# Patient Record
Sex: Male | Born: 1955
Health system: Southern US, Community
[De-identification: ages and names within clinical notes are randomized; demographics above are authoritative.]

## PROBLEM LIST (undated history)

## (undated) DIAGNOSIS — R202 Paresthesia of skin: Secondary | ICD-10-CM

## (undated) DIAGNOSIS — F419 Anxiety disorder, unspecified: Secondary | ICD-10-CM

## (undated) DIAGNOSIS — J449 Chronic obstructive pulmonary disease, unspecified: Secondary | ICD-10-CM

## (undated) DIAGNOSIS — E039 Hypothyroidism, unspecified: Secondary | ICD-10-CM

## (undated) DIAGNOSIS — I10 Essential (primary) hypertension: Secondary | ICD-10-CM

## (undated) DIAGNOSIS — C029 Malignant neoplasm of tongue, unspecified: Secondary | ICD-10-CM

## (undated) DIAGNOSIS — F4310 Post-traumatic stress disorder, unspecified: Secondary | ICD-10-CM

## (undated) DIAGNOSIS — I639 Cerebral infarction, unspecified: Secondary | ICD-10-CM

## (undated) DIAGNOSIS — F429 Obsessive-compulsive disorder, unspecified: Secondary | ICD-10-CM

## (undated) HISTORY — PX: TONSILLECTOMY: SUR1361

## (undated) HISTORY — DX: Malignant neoplasm of tongue, unspecified: C02.9

---

## 2001-05-30 DIAGNOSIS — C801 Malignant (primary) neoplasm, unspecified: Secondary | ICD-10-CM | POA: Insufficient documentation

## 2001-05-30 HISTORY — PX: NECK SURGERY: SHX720

## 2001-07-03 ENCOUNTER — Encounter: Admission: RE | Admit: 2001-07-03 | Discharge: 2001-07-03 | Payer: Self-pay | Admitting: Family Medicine

## 2001-07-03 ENCOUNTER — Encounter: Payer: Self-pay | Admitting: Family Medicine

## 2001-08-03 ENCOUNTER — Other Ambulatory Visit: Admission: RE | Admit: 2001-08-03 | Discharge: 2001-08-03 | Payer: Self-pay | Admitting: Surgery

## 2001-09-26 ENCOUNTER — Ambulatory Visit: Admission: RE | Admit: 2001-09-26 | Discharge: 2001-12-25 | Payer: Self-pay | Admitting: Radiation Oncology

## 2001-09-28 ENCOUNTER — Encounter (HOSPITAL_COMMUNITY): Admission: RE | Admit: 2001-09-28 | Discharge: 2001-12-27 | Payer: Self-pay | Admitting: Dentistry

## 2001-10-01 ENCOUNTER — Encounter: Payer: Self-pay | Admitting: Otolaryngology

## 2001-10-03 ENCOUNTER — Inpatient Hospital Stay (HOSPITAL_COMMUNITY): Admission: RE | Admit: 2001-10-03 | Discharge: 2001-10-05 | Payer: Self-pay | Admitting: Otolaryngology

## 2001-10-03 ENCOUNTER — Encounter (INDEPENDENT_AMBULATORY_CARE_PROVIDER_SITE_OTHER): Payer: Self-pay | Admitting: Specialist

## 2001-12-07 ENCOUNTER — Encounter: Payer: Self-pay | Admitting: Radiation Oncology

## 2001-12-07 ENCOUNTER — Ambulatory Visit (HOSPITAL_COMMUNITY): Admission: RE | Admit: 2001-12-07 | Discharge: 2001-12-07 | Payer: Self-pay | Admitting: Radiation Oncology

## 2002-01-31 ENCOUNTER — Ambulatory Visit (HOSPITAL_COMMUNITY): Admission: RE | Admit: 2002-01-31 | Discharge: 2002-01-31 | Payer: Self-pay | Admitting: Radiation Oncology

## 2002-06-20 ENCOUNTER — Ambulatory Visit: Admission: RE | Admit: 2002-06-20 | Discharge: 2002-06-20 | Payer: Self-pay | Admitting: Radiation Oncology

## 2002-06-24 ENCOUNTER — Ambulatory Visit (HOSPITAL_COMMUNITY): Admission: RE | Admit: 2002-06-24 | Discharge: 2002-06-24 | Payer: Self-pay | Admitting: Radiation Oncology

## 2003-03-20 ENCOUNTER — Ambulatory Visit: Admission: RE | Admit: 2003-03-20 | Discharge: 2003-03-20 | Payer: Self-pay | Admitting: Radiation Oncology

## 2003-06-19 ENCOUNTER — Ambulatory Visit: Admission: RE | Admit: 2003-06-19 | Discharge: 2003-06-19 | Payer: Self-pay | Admitting: Radiation Oncology

## 2003-10-23 ENCOUNTER — Ambulatory Visit: Admission: RE | Admit: 2003-10-23 | Discharge: 2003-10-23 | Payer: Self-pay | Admitting: Radiation Oncology

## 2004-04-15 ENCOUNTER — Ambulatory Visit: Admission: RE | Admit: 2004-04-15 | Discharge: 2004-04-15 | Payer: Self-pay | Admitting: Radiation Oncology

## 2004-08-26 ENCOUNTER — Ambulatory Visit: Admission: RE | Admit: 2004-08-26 | Discharge: 2004-08-26 | Payer: Self-pay | Admitting: Radiation Oncology

## 2004-12-23 ENCOUNTER — Ambulatory Visit: Admission: RE | Admit: 2004-12-23 | Discharge: 2004-12-23 | Payer: Self-pay | Admitting: Radiation Oncology

## 2005-09-16 ENCOUNTER — Encounter: Payer: Self-pay | Admitting: Vascular Surgery

## 2005-09-16 ENCOUNTER — Inpatient Hospital Stay (HOSPITAL_COMMUNITY): Admission: EM | Admit: 2005-09-16 | Discharge: 2005-09-17 | Payer: Self-pay | Admitting: Emergency Medicine

## 2005-09-23 ENCOUNTER — Ambulatory Visit: Payer: Self-pay | Admitting: Internal Medicine

## 2005-10-07 ENCOUNTER — Ambulatory Visit: Payer: Self-pay | Admitting: Internal Medicine

## 2005-10-25 ENCOUNTER — Ambulatory Visit: Payer: Self-pay | Admitting: *Deleted

## 2006-01-16 ENCOUNTER — Ambulatory Visit: Payer: Self-pay | Admitting: Internal Medicine

## 2006-01-16 ENCOUNTER — Ambulatory Visit (HOSPITAL_COMMUNITY): Admission: RE | Admit: 2006-01-16 | Discharge: 2006-01-16 | Payer: Self-pay | Admitting: Internal Medicine

## 2006-01-20 ENCOUNTER — Ambulatory Visit: Payer: Self-pay | Admitting: Internal Medicine

## 2006-03-10 ENCOUNTER — Ambulatory Visit: Payer: Self-pay | Admitting: Internal Medicine

## 2006-03-14 ENCOUNTER — Ambulatory Visit (HOSPITAL_COMMUNITY): Admission: RE | Admit: 2006-03-14 | Discharge: 2006-03-14 | Payer: Self-pay | Admitting: Internal Medicine

## 2007-02-16 DIAGNOSIS — J439 Emphysema, unspecified: Secondary | ICD-10-CM | POA: Insufficient documentation

## 2007-02-16 DIAGNOSIS — F329 Major depressive disorder, single episode, unspecified: Secondary | ICD-10-CM | POA: Insufficient documentation

## 2007-02-16 DIAGNOSIS — F1996 Other psychoactive substance use, unspecified with psychoactive substance-induced persisting amnestic disorder: Secondary | ICD-10-CM | POA: Insufficient documentation

## 2007-02-16 DIAGNOSIS — E039 Hypothyroidism, unspecified: Secondary | ICD-10-CM | POA: Insufficient documentation

## 2007-03-09 ENCOUNTER — Encounter (INDEPENDENT_AMBULATORY_CARE_PROVIDER_SITE_OTHER): Payer: Self-pay | Admitting: Internal Medicine

## 2007-03-09 ENCOUNTER — Telehealth (INDEPENDENT_AMBULATORY_CARE_PROVIDER_SITE_OTHER): Payer: Self-pay | Admitting: *Deleted

## 2007-03-13 ENCOUNTER — Ambulatory Visit: Payer: Self-pay | Admitting: Nurse Practitioner

## 2007-03-13 DIAGNOSIS — K029 Dental caries, unspecified: Secondary | ICD-10-CM | POA: Insufficient documentation

## 2007-03-13 LAB — CONVERTED CEMR LAB
Albumin: 4.8 g/dL (ref 3.5–5.2)
BUN: 19 mg/dL (ref 6–23)
CO2: 26 meq/L (ref 19–32)
Calcium: 9.6 mg/dL (ref 8.4–10.5)
Cholesterol: 200 mg/dL (ref 0–200)
Eosinophils Absolute: 0.2 10*3/uL (ref 0.0–0.7)
Free T4: 1.34 ng/dL (ref 0.89–1.80)
Glucose, Bld: 78 mg/dL (ref 70–99)
HCT: 48.1 % (ref 39.0–52.0)
HDL: 46 mg/dL (ref >39)
LDL Cholesterol: 140 mg/dL — ABNORMAL HIGH (ref 0–99)
Lymphocytes Relative: 19 % (ref 12–46)
Lymphs Abs: 1.1 10*3/uL (ref 0.7–3.3)
MCV: 96.6 fL (ref 78.0–100.0)
Monocytes Relative: 8 % (ref 3–11)
Neutrophils Relative %: 69 % (ref 43–77)
Platelets: 224 10*3/uL (ref 150–400)
Potassium: 4.3 meq/L (ref 3.5–5.3)
RBC: 4.98 M/uL (ref 4.22–5.81)
Sodium: 139 meq/L (ref 135–145)
TSH: 4.713 microintl units/mL (ref 0.350–5.50)
Total Protein: 7.6 g/dL (ref 6.0–8.3)
Triglycerides: 68 mg/dL (ref <150)
VLDL: 14 mg/dL (ref 0–40)
WBC: 5.6 10*3/uL (ref 4.0–10.5)

## 2007-03-14 ENCOUNTER — Encounter (INDEPENDENT_AMBULATORY_CARE_PROVIDER_SITE_OTHER): Payer: Self-pay | Admitting: Nurse Practitioner

## 2007-04-03 ENCOUNTER — Telehealth (INDEPENDENT_AMBULATORY_CARE_PROVIDER_SITE_OTHER): Payer: Self-pay | Admitting: Internal Medicine

## 2007-06-07 ENCOUNTER — Ambulatory Visit: Payer: Self-pay | Admitting: Family Medicine

## 2007-06-07 ENCOUNTER — Encounter (INDEPENDENT_AMBULATORY_CARE_PROVIDER_SITE_OTHER): Payer: Self-pay | Admitting: Internal Medicine

## 2007-06-08 ENCOUNTER — Encounter (INDEPENDENT_AMBULATORY_CARE_PROVIDER_SITE_OTHER): Payer: Self-pay | Admitting: Internal Medicine

## 2007-11-02 ENCOUNTER — Telehealth (INDEPENDENT_AMBULATORY_CARE_PROVIDER_SITE_OTHER): Payer: Self-pay | Admitting: Internal Medicine

## 2007-11-20 ENCOUNTER — Encounter (INDEPENDENT_AMBULATORY_CARE_PROVIDER_SITE_OTHER): Payer: Self-pay | Admitting: Internal Medicine

## 2008-01-03 ENCOUNTER — Ambulatory Visit: Payer: Self-pay | Admitting: Internal Medicine

## 2008-01-03 DIAGNOSIS — K056 Periodontal disease, unspecified: Secondary | ICD-10-CM | POA: Insufficient documentation

## 2008-01-03 DIAGNOSIS — K069 Disorder of gingiva and edentulous alveolar ridge, unspecified: Secondary | ICD-10-CM

## 2008-06-04 ENCOUNTER — Encounter (INDEPENDENT_AMBULATORY_CARE_PROVIDER_SITE_OTHER): Payer: Self-pay | Admitting: Internal Medicine

## 2008-06-04 LAB — CONVERTED CEMR LAB
BUN: 16 mg/dL (ref 6–23)
Barbiturate Quant, Ur: NEGATIVE
Benzodiazepines.: NEGATIVE
Creatinine, Ser: 1.11 mg/dL (ref 0.40–1.50)
Marijuana Metabolite: POSITIVE — AB
Methadone: NEGATIVE
Propoxyphene: NEGATIVE

## 2008-06-05 ENCOUNTER — Encounter (INDEPENDENT_AMBULATORY_CARE_PROVIDER_SITE_OTHER): Payer: Self-pay | Admitting: Nurse Practitioner

## 2008-08-11 ENCOUNTER — Encounter (INDEPENDENT_AMBULATORY_CARE_PROVIDER_SITE_OTHER): Payer: Self-pay | Admitting: Internal Medicine

## 2008-09-18 ENCOUNTER — Ambulatory Visit: Payer: Self-pay | Admitting: Internal Medicine

## 2008-09-18 DIAGNOSIS — R0989 Other specified symptoms and signs involving the circulatory and respiratory systems: Secondary | ICD-10-CM | POA: Insufficient documentation

## 2008-09-28 LAB — CONVERTED CEMR LAB
ALT: 18 units/L (ref 0–53)
AST: 29 units/L (ref 0–37)
Alkaline Phosphatase: 64 units/L (ref 39–117)
Basophils Relative: 0 % (ref 0–1)
CO2: 26 meq/L (ref 19–32)
Cholesterol: 248 mg/dL — ABNORMAL HIGH (ref 0–200)
Creatinine, Ser: 1.05 mg/dL (ref 0.40–1.50)
LDL Cholesterol: 173 mg/dL — ABNORMAL HIGH (ref 0–99)
MCHC: 33.6 g/dL (ref 30.0–36.0)
Monocytes Relative: 6 % (ref 3–12)
Neutro Abs: 5 10*3/uL (ref 1.7–7.7)
Neutrophils Relative %: 73 % (ref 43–77)
PSA: 0.72 ng/mL (ref 0.10–4.00)
Platelets: 175 10*3/uL (ref 150–400)
RBC: 4.59 M/uL (ref 4.22–5.81)
Sodium: 139 meq/L (ref 135–145)
TSH: 3.289 microintl units/mL (ref 0.350–4.500)
Total Bilirubin: 0.7 mg/dL (ref 0.3–1.2)
Total CHOL/HDL Ratio: 5
Total Protein: 7.3 g/dL (ref 6.0–8.3)
VLDL: 25 mg/dL (ref 0–40)
WBC: 6.8 10*3/uL (ref 4.0–10.5)

## 2008-10-02 ENCOUNTER — Encounter (INDEPENDENT_AMBULATORY_CARE_PROVIDER_SITE_OTHER): Payer: Self-pay | Admitting: Internal Medicine

## 2008-10-02 ENCOUNTER — Ambulatory Visit: Payer: Self-pay | Admitting: Vascular Surgery

## 2008-10-02 ENCOUNTER — Ambulatory Visit (HOSPITAL_COMMUNITY): Admission: RE | Admit: 2008-10-02 | Discharge: 2008-10-02 | Payer: Self-pay | Admitting: Internal Medicine

## 2008-10-08 ENCOUNTER — Encounter (INDEPENDENT_AMBULATORY_CARE_PROVIDER_SITE_OTHER): Payer: Self-pay | Admitting: Internal Medicine

## 2008-10-13 ENCOUNTER — Encounter (INDEPENDENT_AMBULATORY_CARE_PROVIDER_SITE_OTHER): Payer: Self-pay | Admitting: Internal Medicine

## 2008-10-23 ENCOUNTER — Ambulatory Visit: Payer: Self-pay | Admitting: Internal Medicine

## 2008-10-23 DIAGNOSIS — R109 Unspecified abdominal pain: Secondary | ICD-10-CM | POA: Insufficient documentation

## 2008-10-23 DIAGNOSIS — M545 Low back pain, unspecified: Secondary | ICD-10-CM | POA: Insufficient documentation

## 2008-10-23 DIAGNOSIS — E7800 Pure hypercholesterolemia, unspecified: Secondary | ICD-10-CM | POA: Insufficient documentation

## 2009-01-21 ENCOUNTER — Ambulatory Visit: Payer: Self-pay | Admitting: Internal Medicine

## 2009-01-21 LAB — CONVERTED CEMR LAB: Cholesterol: 184 mg/dL (ref 0–200)

## 2009-01-27 ENCOUNTER — Ambulatory Visit: Payer: Self-pay | Admitting: Internal Medicine

## 2009-01-27 DIAGNOSIS — S1093XA Contusion of unspecified part of neck, initial encounter: Secondary | ICD-10-CM

## 2009-01-27 DIAGNOSIS — S0083XA Contusion of other part of head, initial encounter: Secondary | ICD-10-CM

## 2009-01-27 DIAGNOSIS — S0003XA Contusion of scalp, initial encounter: Secondary | ICD-10-CM | POA: Insufficient documentation

## 2009-12-21 ENCOUNTER — Telehealth (INDEPENDENT_AMBULATORY_CARE_PROVIDER_SITE_OTHER): Payer: Self-pay | Admitting: Internal Medicine

## 2010-01-20 ENCOUNTER — Telehealth (INDEPENDENT_AMBULATORY_CARE_PROVIDER_SITE_OTHER): Payer: Self-pay | Admitting: Internal Medicine

## 2010-02-22 ENCOUNTER — Telehealth (INDEPENDENT_AMBULATORY_CARE_PROVIDER_SITE_OTHER): Payer: Self-pay | Admitting: Internal Medicine

## 2010-03-12 ENCOUNTER — Ambulatory Visit: Payer: Self-pay | Admitting: Internal Medicine

## 2010-03-12 DIAGNOSIS — R51 Headache: Secondary | ICD-10-CM | POA: Insufficient documentation

## 2010-03-12 DIAGNOSIS — R519 Headache, unspecified: Secondary | ICD-10-CM | POA: Insufficient documentation

## 2010-03-12 DIAGNOSIS — I10 Essential (primary) hypertension: Secondary | ICD-10-CM | POA: Insufficient documentation

## 2010-03-12 DIAGNOSIS — J309 Allergic rhinitis, unspecified: Secondary | ICD-10-CM | POA: Insufficient documentation

## 2010-03-26 ENCOUNTER — Ambulatory Visit: Payer: Self-pay | Admitting: Internal Medicine

## 2010-04-01 LAB — CONVERTED CEMR LAB
BUN: 11 mg/dL (ref 6–23)
CO2: 26 meq/L (ref 19–32)
Chloride: 102 meq/L (ref 96–112)
Cholesterol: 287 mg/dL — ABNORMAL HIGH (ref 0–200)
Glucose, Bld: 75 mg/dL (ref 70–99)
LDL Cholesterol: 207 mg/dL — ABNORMAL HIGH (ref 0–99)
Potassium: 4.5 meq/L (ref 3.5–5.3)
Sodium: 138 meq/L (ref 135–145)
Total CHOL/HDL Ratio: 4.9
VLDL: 21 mg/dL (ref 0–40)

## 2010-04-09 ENCOUNTER — Ambulatory Visit: Payer: Self-pay | Admitting: Internal Medicine

## 2010-05-10 ENCOUNTER — Ambulatory Visit: Payer: Self-pay | Admitting: Internal Medicine

## 2010-06-29 NOTE — Progress Notes (Signed)
Summary: Query:  refill levothyroxine?  Phone Note Outgoing Call   Summary of Call: Do you want his levothyroxine refilled?  He hasn't been seen since 12/2008.  I notified him that he needs a CPE -- has appt. to renew orange card next month. Initial call taken by: Dutch Quint RN,  January 20, 2010 9:46 AM  Follow-up for Phone Call        yes, refill for 30 tabs with 1 refill until pt can be seen in office since he can get eligibility next month Follow-up by: Lehman Prom FNP,  January 20, 2010 5:47 PM  Additional Follow-up for Phone Call Additional follow up Details #1::        Noted.  Dutch Quint RN  January 20, 2010 5:52 PM

## 2010-06-29 NOTE — Assessment & Plan Note (Signed)
Summary: F/U APP FOR THYROID MEDS//[facility]   Vital Signs:  Patient profile:   55 year old male Weight:      153.5 pounds Temp:     97.0 degrees F oral Pulse rate:   60 / minute Pulse rhythm:   regular Resp:     16 per minute BP sitting:   138 / 90  (left arm) Cuff size:   regular  Vitals Entered By: Michelle Nasuti (March 12, 2010 9:10 AM) CC: THYROID FOLLOW UP.MED REFILL NEEDED Is Patient Diabetic? No Pain Assessment Patient in pain? yes     Location: R SHOULDER Intensity: 1  Does patient need assistance? Functional Status Self care Ambulation Normal   CC:  THYROID FOLLOW UP.MED REFILL NEEDED.  History of Present Illness: Pt. here after a bit of a hiatus--ran out of eligibility for a time.  1.  Hypothyroidism:  States he has been taking hormone regularly.  Needs a level.  2.  Elevated BP:  Having a bit of a headache this morning--usually has a couple of cups of coffee in the morning, but did not have any today.  Has had headaches before when skips his coffee.    3.  Headaches:  2-3 times weekly.  Awakens with this--feels like it's behind his eyes.  Later in morning, generally has cleared--usually drinking coffee at the time.  Has been sniffling and sneezing recently as well.  Also having drainage down throat with a bit of a sore throat.  No eye symptoms.  Did have ear pain  6 weeks ago--used some otc ear drops and herbal pills and cleared after a couple of days.    4.  Dysphagia:  Has had a couple of episodes--throat sensitive since tongue squamous cell carcinoma treatment and radiation.  No heartburn.  No brash taste in mouth on awakening.  Does get thick mucous in throat since cancer and radiation treatment.  5.  Allergic Reaction to seafood past summer--all sorts of seafood--only new thing was crawdads.  Swelled with tongue and all over on skin--had itching and hives as well.    Took some Benadryl.  Did not seek medical attention.  Current Medications (verified): 1)   Synthroid 50 Mcg  Tabs (Levothyroxine Sodium) .Marland Kitchen.. 1 Tab By Mouth Daily 2)  Lexapro 10 Mg  Tabs (Escitalopram Oxalate) .Marland Kitchen.. 1 Tablet By Mouth Daily For Mood 3)  Trazodone Hcl 50 Mg  Tabs (Trazodone Hcl) .Marland Kitchen.. 1 Tablet By Mouth At Night For Sleep 4)  Wellbutrin Xl 150 Mg  Xr24h-Tab (Bupropion Hcl) .... Take One Tablet Every Morning 5)  Hydrocodone-Acetaminophen 5-325 Mg Tabs (Hydrocodone-Acetaminophen) .Marland Kitchen.. 1 - 2 Tabs By Mouth Every 4 Hours As Needed Pain  Allergies (verified): 1)  ! * Seafood  Physical Exam  General:  NAD Head:  Normocephalic and atraumatic without obvious abnormalities.  Eyes:  No corneal or conjunctival inflammation noted. EOMI. Perrla. Funduscopic exam benign, without hemorrhages, exudates or papilledema. Vision grossly normal. Ears:  External ear exam shows no significant lesions or deformities.  Otoscopic examination reveals clear canals, tympanic membranes are intact bilaterally without bulging, retraction, inflammation or discharge. Hearing is grossly normal bilaterally. Nose:  Nasal mucosa a bit swollen with clear nasal discharge Mouth:  pharynx pink and moist.  No obvious cobbling of posterior pharynx Neck:  No deformities, masses, or tenderness noted. Lungs:  Decreased BS throughout, no crackles or wheeze.   Heart:  Normal rate and regular rhythm. S1 and S2 normal without gallop, murmur, click, rub or other extra  sounds. Abdomen:  Bowel sounds positive,abdomen soft and non-tender without masses, organomegaly or hernias noted. Neurologic:  alert & oriented X3 and cranial nerves II-XII intact.     Impression & Recommendations:  Problem # 1:  HEADACHE (ICD-784.0) Sounds related to caffeine withdrawal or allergies,  but need to follow up on bp.  Problem # 2:  ALLERGIC RHINITIS (ICD-477.9) Start meds--discussed Xyzal may make him too dry--to stop if notes this. His updated medication list for this problem includes:    Xyzal 5 Mg Tabs (Levocetirizine  dihydrochloride) .Marland Kitchen... 1 tab by mouth daily as needed allergies    Nasacort Aq 55 Mcg/act Aers (Triamcinolone acetonide) .Marland Kitchen... 2 sprays each nostril daily  Problem # 3:  ELEVATED BLOOD PRESSURE WITHOUT DIAGNOSIS OF HYPERTENSION (ICD-796.2) BP still elevated on recheck Orders: UA Dipstick w/o Micro (manual) ([address]) T-Basic Metabolic Panel ([id number])  Problem # 4:  COPD (ICD-496) Stable --needs flu and Pneumovax--out of the latter today--give flu vaccine.  Problem # 5:  HYPOTHYROIDISM (ICD-244.9)  Check labs today His updated medication list for this problem includes:    Synthroid 50 Mcg Tabs (Levothyroxine sodium) .Marland Kitchen... 1 tab by mouth daily  Orders: T-TSH ([id number])  Complete Medication List: 1)  Synthroid 50 Mcg Tabs (Levothyroxine sodium) .Marland Kitchen.. 1 tab by mouth daily 2)  Lexapro 10 Mg Tabs (Escitalopram oxalate) .Marland Kitchen.. 1 tablet by mouth daily for mood 3)  Trazodone Hcl 50 Mg Tabs (Trazodone hcl) .Marland Kitchen.. 1 tablet by mouth at night for sleep 4)  Wellbutrin Xl 150 Mg Xr24h-tab (Bupropion hcl) .... Take one tablet every morning 5)  Epipen 0.3 Mg/0.4ml Devi (Epinephrine) .... Use as directed as needed severe allergic reaction 6)  Xyzal 5 Mg Tabs (Levocetirizine dihydrochloride) .Marland Kitchen.. 1 tab by mouth daily as needed allergies 7)  Nasacort Aq 55 Mcg/act Aers (Triamcinolone acetonide) .... 2 sprays each nostril daily  Other Orders: T-Lipid Profile ([id number]) Influenza Vaccine NON MCR (96295)  Patient Instructions: 1)  Nurse visit for bp check in 2 weeks.  If still high, will need to start meds 2)  Pneumovax when here for bp check 3)  CPE with Dr. Delrae Alfred in 4-6 months Prescriptions: NASACORT AQ 55 MCG/ACT AERS (TRIAMCINOLONE ACETONIDE) 2 sprays each nostril daily  #1 month x 11   Entered and Authorized by:   Julieanne Manson MD   Signed by:   Julieanne Manson MD on 03/12/2010   Method used:   Faxed to ...       [facility] (retail)       [address] [place], [place]  [address]       Ph: [phone]       Fax: [phone]   RxID:   [id number] XYZAL 5 MG TABS (LEVOCETIRIZINE DIHYDROCHLORIDE) 1 tab by mouth daily as needed allergies  #30 x 11   Entered and Authorized by:   Julieanne Manson MD   Signed by:   Julieanne Manson MD on 03/12/2010   Method used:   Faxed to ...       [facility] (retail)       [address] [place], [place]  [address]       Ph: [phone]       Fax: [phone]   RxID:   [id number] SYNTHROID 50 MCG  TABS (LEVOTHYROXINE SODIUM) 1 tab by mouth daily  #30 Each x 11   Entered and Authorized by:  Julieanne Manson MD   Signed by:   Julieanne Manson MD on 03/12/2010   Method used:   Electronically to        [facility]  [phone]* (retail)       [address]       [place], [place]  [address]       Ph: [phone] or [phone]       Fax: [phone]   RxID:   [id number] EPIPEN 0.3 MG/0.3ML DEVI (EPINEPHRINE) use as directed as needed severe allergic reaction  #1 x 1   Entered and Authorized by:   Julieanne Manson MD   Signed by:   Julieanne Manson MD on 03/12/2010   Method used:   Electronically to        [facility]  [phone]* (retail)       [address]       [place], [place]  [address]       Ph: [phone] or [phone]       Fax: [phone]   RxID:   [id number]    Influenza Vaccine    Vaccine Type: Fluvax Non-MCR    Site: left deltoid    Mfr: GlaxoSmithKline    Dose: 0.5 ml    Route: IM    Given by: Michelle Nasuti    Exp. Date: 11/27/2010    Lot #: [id number]    VIS given: 12/22/09 version given March 12, 2010.  Flu Vaccine Consent Questions    Do you have a history of severe allergic reactions to this vaccine? no    Any prior history of allergic reactions to egg and/or gelatin? no    Do you have a  sensitivity to the preservative Thimersol? no    Do you have a past history of Guillan-Barre Syndrome? no    Do you currently have an acute febrile illness? no    Have you ever had a severe reaction to latex? no    Vaccine information given and explained to patient? yes

## 2010-06-29 NOTE — Progress Notes (Signed)
Summary: EAR INFECTION  Phone Note Call from Patient Call back at Home Phone [phone]   Reason for Call: Acute Illness Summary of Call: Jenny Lai PT. MR [name] CALLED AND SAYS THAT HE HAS A EAR INFECTON IN HIS LEFT EAR, AND WANTS TO KNOW IF SOMETHING CAN BE CALLED INTO [facility] ON [address]. HIS CARD HAS EXPIRED AND DOES HAVE AN APPT. FOR RENEWAL. Initial call taken by: Leodis Rains,  December 21, 2009 9:20 AM  Follow-up for Phone Call        Pt. states that he has a pool -- feels like ear was not draining, very sore around ear, inner ear didn't seem to be draining, a lot of inner ear pressure, some itching.  No matter how much he tried to get out fluid, didn't help.  Used ear wash thinking he had wax, nothing came out.  Coolness of solution made ear feel better.  Has had some relief, but has had some pain this morning, better than yesterday or day before.  No redness noted on the outside.    Pain is slightly increased when pulling on ear, can hear like it's "not cloudy" has had trouble hearing over the past four days.  Problems started back at the end of June, then off and on.  Has slight sore throat, also comes and goes.  Advised of home management per protocol -- states already did OTC swim ear with some relief.  Wants something called in because his orange card has lapsed -- advised to go to urgent care if problem persists or gets worse. Follow-up by: Dutch Quint RN,  December 22, 2009 10:08 AM

## 2010-06-29 NOTE — Assessment & Plan Note (Signed)
Summary: 2 WEEK FU FOR BP AND LAB///KT  Nurse Visit   Vital Signs:  Patient profile:   54 year old male Pulse rate:   60 / minute Pulse rhythm:   regular Resp:     20 per minute BP sitting:   134 / 92  (right arm) Cuff size:   regular  Vitals Entered By: Dutch Quint RN (April 09, 2010 9:43 AM)  Impression & Recommendations:  Problem # 1:  ESSENTIAL HYPERTENSION, BENIGN (ICD-401.1) BP better but still elevated Has not been taking HCTZ, been monitoring sodium intake.  BMET not done To return in one month for BP check and BMET with triage nurse.  His updated medication list for this problem includes:    Hydrochlorothiazide 25 Mg Tabs (Hydrochlorothiazide) ..... One tablet by mouth daily for blood pressure.  Orders: Est. Patient Level I ([address])  Complete Medication List: 1)  Synthroid 75 Mcg Tabs (Levothyroxine sodium) .Marland Kitchen.. 1 tab by mouth daily 2)  Lexapro 10 Mg Tabs (Escitalopram oxalate) .Marland Kitchen.. 1 tablet by mouth daily for mood 3)  Trazodone Hcl 50 Mg Tabs (Trazodone hcl) .Marland Kitchen.. 1 tablet by mouth at night for sleep 4)  Wellbutrin Xl 150 Mg Xr24h-tab (Bupropion hcl) .... Take one tablet every morning 5)  Epipen 0.3 Mg/0.36ml Devi (Epinephrine) .... Use as directed as needed severe allergic reaction 6)  Xyzal 5 Mg Tabs (Levocetirizine dihydrochloride) .Marland Kitchen.. 1 tab by mouth daily as needed allergies 7)  Nasacort Aq 55 Mcg/act Aers (Triamcinolone acetonide) .... 2 sprays each nostril daily 8)  Hydrochlorothiazide 25 Mg Tabs (Hydrochlorothiazide) .... One tablet by mouth daily for blood pressure.   Patient Instructions: 1)  Reviewed with Dr. Delrae Alfred. 2)  Your blood pressure is better, but still elevated - 134/92. 3)  Start taking the hydrochlorothiazide as ordered. 4)  Return in one month for a blood pressure check and your labwork with the triage nurse. 5)  Call if anything changes or if you have any questions.   CC:  BP recheck and BMET.  History of Present Illness: Repeat  BP and BMET -- did not start taking his HCTZ.  States he was not feeling well and wanted to see if that effected his BP.  Had "environmental" stressors his last two visits.  States has been monitoring salt intake.   Review of Systems CV:  States asymptomatic.   Physical Exam  Lungs:  normal respiratory effort, normal breath sounds, no crackles, and no wheezes.   Heart:  normal rate and regular rhythm.    CC: BP recheck and BMET Is Patient Diabetic? No Pain Assessment Patient in pain? yes     Location: rt shoulder/neck Intensity: 5 Type: sharp Onset of pain  from past surgery, pain is intermittent, will spike  Does patient need assistance? Functional Status Self care Ambulation Normal   Allergies: 1)  ! * Seafood  Orders Added: 1)  Est. Patient Level I [60454]

## 2010-06-29 NOTE — Progress Notes (Signed)
Summary: MED REFILLS  Phone Note Call from Patient   Reason for Call: Refill Medication Summary of Call: NEED REFILLS TO [facility] FOR THYROID MEDS Initial call taken by: Oscar La,  February 22, 2010 9:53 AM  Follow-up for Phone Call        Do you want to refill his levothyroxine?  Has appt. for 03/05/10. Follow-up by: Dutch Quint RN,  February 22, 2010 10:10 AM  Additional Follow-up for Phone Call Additional follow up Details #1::        Yes--as in last request--refill until follow up appt. Additional Follow-up by: Julieanne Manson MD,  February 24, 2010 9:32 AM    Additional Follow-up for Phone Call Additional follow up Details #2::    Noted.  Rx refilled.  Dutch Quint RN  February 24, 2010 10:24 AM

## 2010-06-29 NOTE — Assessment & Plan Note (Signed)
Summary: F/U 2 WEEKS BP & PHEUMOVAX PER DR MULBERRY / NS  Nurse Visit   Vital Signs:  Patient profile:   55 year old male Pulse rate:   64 / minute Pulse rhythm:   regular Resp:     20 per minute BP sitting:   152 / 98  (right arm) Cuff size:   regular  Patient Instructions: 1)  Reviewed with Lawrence Owen 2)  Your blood pressure was elevated today - 152/98 3)  Please read the handout on blood pressure. 4)  Start hydrochlorothiazide 25 mg. one tablet by mouth daily for blood pressure. 5)  Limit your salt intake -- watch amounts of soda, chips and snacks, processed food such as canned soups, cold cuts and hot dogs. 6)  Return for repeat blood pressure check and lab work in two weeks with triage nurse. 7)  Call if anything changes or if you have any questions.   Impression & Recommendations:  Problem # 1:  ESSENTIAL HYPERTENSION, BENIGN (ICD-401.1) BP still elevated Start on HCTZ 25 mg.  Given handout on BP Return for repeat BP and BMET in two weeks with triage nurse.    His updated medication list for this problem includes:    Hydrochlorothiazide 25 Mg Tabs (Hydrochlorothiazide) ..... One tablet by mouth daily for blood pressure.  Complete Medication List: 1)  Synthroid 50 Mcg Tabs (Levothyroxine sodium) .Marland Kitchen.. 1 tab by mouth daily 2)  Lexapro 10 Mg Tabs (Escitalopram oxalate) .Marland Kitchen.. 1 tablet by mouth daily for mood 3)  Trazodone Hcl 50 Mg Tabs (Trazodone hcl) .Marland Kitchen.. 1 tablet by mouth at night for sleep 4)  Wellbutrin Xl 150 Mg Xr24h-tab (Bupropion hcl) .... Take one tablet every morning 5)  Epipen 0.3 Mg/0.63ml Devi (Epinephrine) .... Use as directed as needed severe allergic reaction 6)  Xyzal 5 Mg Tabs (Levocetirizine dihydrochloride) .Marland Kitchen.. 1 tab by mouth daily as needed allergies 7)  Nasacort Aq 55 Mcg/act Aers (Triamcinolone acetonide) .... 2 sprays each nostril daily 8)  Hydrochlorothiazide 25 Mg Tabs (Hydrochlorothiazide) .... One tablet by mouth daily for blood  pressure.  Other Orders: Pneumococcal Vaccine (09811) Admin 1st Vaccine (91478)   History of Present Illness: Last visit on 10/14, BP was 138/90.  Discussed then that if still high, would need to start meds.  States asymptomatic.      Physical Exam  Lungs:  normal respiratory effort, normal breath sounds, no crackles, and no wheezes.   Heart:  normal rate and regular rhythm.     Allergies: 1)  ! * Seafood  Immunizations Administered:  Pneumonia Vaccine:    Vaccine Type: Pneumovax    Site: right deltoid    Mfr: Merck    Dose: 0.5 ml    Route: IM    Given by: Dutch Quint RN    Exp. Date: 08/16/2011    Lot #: [id number]    VIS given: 05/04/09 version given March 26, 2010.  Orders Added: 1)  Pneumococcal Vaccine [90732] 2)  Admin 1st Vaccine [90471] 3)  Est. Patient Level II [30865] Prescriptions: HYDROCHLOROTHIAZIDE 25 MG TABS (HYDROCHLOROTHIAZIDE) One tablet by mouth daily for blood pressure.  #30 x 1   Entered by:   Dutch Quint RN   Authorized by:   Lehman Prom FNP   Signed by:   Dutch Quint RN on 03/26/2010   Method used:   Print then Give to Patient   RxID:   [id number]

## 2010-10-15 NOTE — H&P (Signed)
NAME:  Lawrence Owen, Lawrence Owen NO.:  [id number]   MEDICAL RECORD NO.:  [id number]          PATIENT TYPE:  EMS   LOCATION:  [facility]                         FACILITY:  [facility]   PHYSICIAN:  Deirdre Peer. Polite, M.D. DATE OF BIRTH:  1956-02-01   DATE OF ADMISSION:  09/15/2005  DATE OF DISCHARGE:                                HISTORY & PHYSICAL   CHIEF COMPLAINT:  Confusion.   HISTORY OF PRESENT ILLNESS:  A 55 year old male with known history of  squamous cell carcinoma of the base of the tongue, status post  ______________ in 2003, history of EtOH abuse, who presented to the ED with  above chief complaint.  The patient's story is somewhat convoluted.  Details  have been taken from him and his wife.  It appears that the patient has been  having some trouble with headaches since Friday, April 13.  Last Friday,  however, the patient had a headache, took a couple of Advil, laid down, and  seemed to have lost quite a significant amount of time.  Appears on Saturday  that the patient had been out driving and currently driving somewhat erratic  and was felt to be intoxicated.  Someone called the police who followed the  patient home and arrested the patient for the same.  Supposedly, the patient  was brought to the ED for evaluation and released.  Details of that  evaluation are unknown.  Later it appears that the patient's behavior began  having confrontation with family members essentially according to his wife,  busting the back door down.  They called 911, the patient was ultimately  detained, arraigned, and discharged yesterday at [facility] and  recommended further evaluation.  In the ED, the patient was evaluated with  the above concerns.  His vitals were stable, he was afebrile.  The patient's  CBC was within normal limits.  Alcohol level was less than 5.  The patient  had a urine drug screen that was positive for benzodiazepine and marijuana.  [facility] were  called for further evaluation.  Further details from  the wife suggest that she felt that the patient may have taken some of her  prescribed medicines that she takes,_____________ Ambien, Klonopin, Paxil  were gone as well.  Because of the above concerns ______________.   PAST MEDICAL HISTORY:  As stated above.   MEDICATIONS:  None.   SOCIAL HISTORY:  _______________.   HISTORY OF PRESENT ILLNESS:  Otherwise denies any fever, chills, nausea and  vomiting.  No diarrhea and no constipation.  No chest pain and no shortness  of breath.  Just confusion and headache.   PHYSICAL EXAMINATION:  GENERAL:  Pleasant and in no acute distress.  VITAL SIGNS:  Afebrile, stable.  HEENT:  Pupils equal, round, and reactive to light.  Anicteric sclerae, no  oral lesions or nodes.  No JVD.  NECK:  Supple with no adenopathy.  CHEST:  Clear.  CARDIOVASCULAR:  Regular.  ABDOMEN:  Soft and nontender.  EXTREMITIES:  Without cyanosis, clubbing, or edema.  NEUROLOGY:  Cranial nerves II-XII grossly intact.  __motor exam  non  focal__________.  Gait is not tested.  _____________. Urine drug screen  positive for benzodiazepines and marijuana.   ASSESSMENT:  1.  _mental staus change____________.  2.  History of EtOH abuse.  3.  History of __squamous_carcinoma___________.   We will check basic electrolytes.  We will add multivitamin, thiamine, and  folate.  Because of the patient's known history of carcinoma, recommend  further __testing ie cat scan___________ to rule out any metastatic disease.      Deirdre Peer. Polite, M.D.  Electronically Signed     RDP/MEDQ  D:  09/15/2005  T:  09/16/2005  Job:  [id number]

## 2010-10-15 NOTE — Procedures (Signed)
EEG NUMBER:  [id number].   HISTORY:  [name] who complained of intermittent amnesia. Patient has  been having staggering gait, history of alcohol abuse.  The study is being  done to look for the presence of seizures.   PROCEDURE:  The tracing is carried out of 32 channel digital Cadwell  recorder reformatted into 16 channel montages with one noted to EKG. The  patient was awake and drowsy during the recording. [employer] 10/20  system lead placement was used. He takes no medications.   DESCRIPTION OF FINDINGS:  Dominant frequency 15 microvolt 9 Hz activity that  was prominent in the posterior regions, superimposed less than 10 microvolt  beta range activity seen frontally.   There is a artifact in the F3 lead. This was not changed throughout the  record. This could be eliminated by changing montages in this area except  for the F3 lead appears normal.   There was no focal slowing with exception of that artifact, no interictal  epileptiform activity in the form of spikes or sharp waves. The patient did  not show significant photic driving response.   IMPRESSION:  Essentially normal record in the waking state and drowsiness.      Lawrence Owen. Sharene Skeans, M.D.  Electronically Signed     [id number]  D:  09/16/2005 16:57:21  T:  09/19/2005 10:40:50  Job #:  [id number]   cc:   Bevelyn Buckles. Nash Shearer, M.D.  Fax: [phone]

## 2010-10-15 NOTE — Consult Note (Signed)
[name], SANGUINETTI NO.:  [id number]   MEDICAL RECORD NO.:  [id number]          PATIENT TYPE:  INP   LOCATION:  2021                         FACILITY:  [facility]   PHYSICIAN:  Bevelyn Buckles. Champey, M.D.DATE OF BIRTH:  11-Aug-1955   DATE OF CONSULTATION:  09/15/2005  DATE OF DISCHARGE:                                   CONSULTATION   REQUESTING PHYSICIAN:  Paula Libra, M.D.   REASON FOR CONSULTATION:  Confusion, altered mental status and memory  disturbance.   HISTORY OF PRESENT ILLNESS:  [name] is a 55 year old Caucasian male with  a past medical history of throat cancer, status post radiation, who presents  after having memory disturbance and amnesia for the past few days.  The  patient states he woke up last Friday, felt sleepy and tired, slept most of  the day, and has no memory of events from Friday to Monday even though he  does state later in the conversation that he has a tremendous amount of  stress at home with his stepkids calling him names and being very  disrespectful to him.  Saturday the patient was arrested for DWI and then  released and re-arrested Monday for domestic violence at home.  He got out  of jail yesterday and since then has been having complaints of memory  problems, being off balance, lightheaded, and tinnitus.  The patient was  evaluated by mental health.  He denies any symptoms of weakness, numbness,  vision changes, speech and swallowing problems, chewing problems, vertigo,  or loss of consciousness.   PAST MEDICAL HISTORY:  Positive for throat cancer, status post radiation.   CURRENT MEDICATIONS:  Aleve p.r.n.   ALLERGIES:  The patient has no known drug allergies.   FAMILY HISTORY:  Positive for emphysema and heart disease.   SOCIAL HISTORY:  The patient lives with his wife and stepkids.  Denies any  smoking or drug use.  Occasionally will drink alcohol.   REVIEW OF SYSTEMS:  Positive as per HPI and also positive for anxiety  and  depression.  Review of systems negative as per HPI in greater than seven  other organ systems.   PHYSICAL EXAMINATION:  VITAL SIGNS:  Temperature is 98.0, blood pressure is  139/86, pulse is 45-53, respirations 16, O2 saturation is 99% on room air.  HEENT:  Normocephalic, atraumatic.  Extraocular muscles are intact.  Pupils  equal, round, and reactive to light.  NECK:  Supple with no carotid bruits.  CARDIAC:  Heart is regular.  LUNGS:  Clear.  ABDOMEN:  Soft, nontender.  EXTREMITIES:  No edema, with good pulses.  NEUROLOGIC:  The patient is awake, alert and oriented x3.  Language is  fluent.  The patient is following commands well.  His cranial nerves II-XII  are grossly intact.  Motor examination shows 5/5 strength and normal tone in  all four extremities.  No drift is noted.  Sensory examination is within  normal limits to light touch.  Reflexes are 1-2+ and symmetric.  Toes are  neutral bilaterally.  Cerebellar function is within normal limits, finger-to-  nose  and heel-to-shin.  The gait is unremarkable.   LABORATORY DATA:  WBC is 4.5, hemoglobin 14.8, hematocrit is 43.1, platelets  are 164.  Sodium is 139, potassium 3.9, chloride is 107, CO2 is 26, BUN 14,  creatinine is 1.1, glucose 91.  Alcohol is less than 5.  CT of the head  showed no acute abnormalities.  EKG shows sinus bradycardia at 45 beats per  minute.   IMPRESSION:  A 55 year old who complains with questionable multiple-day  amnesia with lightheadedness and tinnitus.  The patient has a complicated  history over the past few days with multiple arrests and questionable  behavior.  The patient asks if this could be stress-induced, which is  definitely as possibility.  His neurological examination is completely  unremarkable.  I would recommend checking an MRI of the brain and an EEG for  further evaluation.  I would also recommend a possible syncopal evaluation,  as the patient has been complaining of presyncope,  especially with his  bradycardia.  The patient might need a psychiatric evaluation as well for a  complete evaluation.  Will follow the patient as consultants.      Bevelyn Buckles. Nash Shearer, M.D.  Electronically Signed     DRC/MEDQ  D:  09/15/2005  T:  09/16/2005  Job:  [id number]

## 2010-10-15 NOTE — Op Note (Signed)
[place]  Patient:    EREK, KOWAL Visit Number: [id number] MRN: [id number]          Service Type: SUR Location: [id number] Attending Physician:  Carlean Purl Dictated by:   Kristine Garbe Ezzard Standing, M.D. Proc. Date: 10/03/01 Admit Date:  10/03/2001   CC:         Maryln Gottron, M.D.  Sandria Bales. Ezzard Standing, M.D.   Operative Report  PREOPERATIVE DIAGNOSIS:  Metastatic right neck squamous cell carcinoma, unknown primary.  POSTOPERATIVE DIAGNOSIS:  Right neck metastatic squamous cell carcinoma with biopsy positive squamous cell carcinoma of the right face and tongue.  OPERATION PERFORMED:  Direct laryngoscopy and biopsy, modified right neck dissection.  SURGEON:  Kristine Garbe. Ezzard Standing, M.D.  ASSISTANT:  Sandria Bales. Ezzard Standing, M.D.  ANESTHESIA:  General endotracheal.  ESTIMATED BLOOD LOSS:  150 cc.  COMPLICATIONS:  None.  INDICATIONS FOR PROCEDURE:  The patient is a 55 year old gentleman who has had two enlarged right neck nodes that were fine needle aspirate negative for cancer but showed atypical cells.  On exam in the office he has no obvious primary in the oropharynx or hypopharyngeal or laryngeal regions.  He underwent excision of a right neck node three weeks ago and this revealed metastatic squamous cell carcinoma of a right upper jugulodigastric lymph node.  He has a second large approximately 3 cm node  in the midjugular region.  He has seen Radiation Oncology and it has been recommended that he undergo direct laryngoscopy, biopsy and right radical neck dissection prior to receiving radiation therapy.  DESCRIPTION OF PROCEDURE:  After endotracheal anesthesia, first direct laryngoscopy was performed.  The patient is status post tonsillectomy.  The nasopharynx was clear to examination with the mirror.  On examination of the base of tongue, hypopharyngeal with laryngoscope, the patient had some fullness in the right  base of tongue with a little bit of irregular appearing lymphoid type tissue and several biopsies were obtained and sent for frozen section.  The laryngeal structures and epiglottis were all normal in appearance as was the piriform sinuses, aryepiglottic folds and vocal cords were all normal.  On frozen section, this revealed squamous cell carcinoma. Following this, a right modified neck dissection was performed.  A standard horizontal incision was made just below the angle of the mandible and a vertical limb was dropped back posteriorly.  The previous excision site was elliptically excised.  The dissection was carried down to the sternocleidomastoid muscle.  Subplatysmal flaps were elevated superiorly, inferiorly and posteriorly.  It was elected not to dissect out the submandibular triangle as there was no palpable adenopathy in this area. Dissection was carried out just inferior to the submandibular gland where the digastric muscle was identified.  Dissection was then carried out  posteriorly superiorly through the tail of the parotid back up to the superior aspect of the internal jugular vein. There was a single slightly enlarged approximately 2 cm node in the superior jugular region.  This was dissected down with the specimen.  The jugular vein superiorly was identified, was dissected out, ligated with 2-0 silk sutures and suture ligated.  The spinal accessory node was identified in this region.  It was also identified back to the posterior triangle of the neck.  Dissection was carried down inferiorly where the inferior jugular vein was identified, was dissected out, ligated with 2-0 silk sutures and suture ligated with 3-0 silk suture.  The vagus nerve and carotid artery were identified  and the neck specimen was dissected off of the vagus nerve and carotid artery.  The neck contents along with the jugular vein were dissected off the deep cervical fascia.  The remaining large neck node  was in the midjugular area just beneath the sternocleidomastoid.  The muscle was not involved.  A portion of the supraclavicular fat pad was dissected out.  The spinal accessory node was preserved throughout.  Specimen was dissected off the deep cervical fascia and was sent to pathology.  A single long suture was used to mark the inferior aspect of the internal jugular vein and a separate shorter suture was used to mark the superior portion of the neck dissection. The wound was copiously irrigated with saline.  2-0 and 3-0 silk ligatures were used for hemostasis as was cautery.  A Hemovac drain was brought out through a separate stab incision, was secured to the skin with 3-0 silk suture.  The wound was closed with 3-0 chromic sutures subcutaneously and 4-0 nylon on the skin.  The patient was awakened from anesthesia and transferred to recovery room postoperatively doing well.  DISPOSITION:  The patient will be admitted to the hospital for observation for the next 48 hours, will receive perioperative Ancef.  When his Hemovac drains have decreased in volume will plan on removing the drains and discharging the patient home. Dictated by:   Kristine Garbe Ezzard Standing, M.D. Attending Physician:  Carlean Purl DD:  10/03/01 TD:  10/05/01 Job: [id number]

## 2014-08-29 ENCOUNTER — Emergency Department (HOSPITAL_COMMUNITY)
Admission: EM | Admit: 2014-08-29 | Discharge: 2014-08-30 | Disposition: A | Discharge status: Home / Self Care | Payer: BLUE CROSS/BLUE SHIELD | Attending: Emergency Medicine | Admitting: Emergency Medicine

## 2014-08-29 ENCOUNTER — Encounter (HOSPITAL_COMMUNITY): Payer: Self-pay | Admitting: *Deleted

## 2014-08-29 DIAGNOSIS — H66002 Acute suppurative otitis media without spontaneous rupture of ear drum, left ear: Secondary | ICD-10-CM

## 2014-08-29 DIAGNOSIS — Z859 Personal history of malignant neoplasm, unspecified: Secondary | ICD-10-CM | POA: Diagnosis not present

## 2014-08-29 DIAGNOSIS — J069 Acute upper respiratory infection, unspecified: Secondary | ICD-10-CM | POA: Insufficient documentation

## 2014-08-29 DIAGNOSIS — Z720 Tobacco use: Secondary | ICD-10-CM | POA: Insufficient documentation

## 2014-08-29 DIAGNOSIS — Z79899 Other long term (current) drug therapy: Secondary | ICD-10-CM | POA: Diagnosis not present

## 2014-08-29 DIAGNOSIS — R51 Headache: Secondary | ICD-10-CM | POA: Diagnosis not present

## 2014-08-29 DIAGNOSIS — R519 Headache, unspecified: Secondary | ICD-10-CM

## 2014-08-29 LAB — COMPREHENSIVE METABOLIC PANEL
ALK PHOS: 50 U/L (ref 39–117)
ALT: 18 U/L (ref 0–53)
AST: 24 U/L (ref 0–37)
Albumin: 4.2 g/dL (ref 3.5–5.2)
Anion gap: 4 — ABNORMAL LOW (ref 5–15)
BILIRUBIN TOTAL: 1.4 mg/dL — AB (ref 0.3–1.2)
BUN: 15 mg/dL (ref 6–23)
CALCIUM: 9 mg/dL (ref 8.4–10.5)
CHLORIDE: 103 mmol/L (ref 96–112)
CO2: 27 mmol/L (ref 19–32)
Creatinine, Ser: 1.27 mg/dL (ref 0.50–1.35)
GFR, EST AFRICAN AMERICAN: 71 mL/min — AB (ref >90)
GFR, EST NON AFRICAN AMERICAN: 61 mL/min — AB (ref >90)
GLUCOSE: 128 mg/dL — AB (ref 70–99)
POTASSIUM: 3.8 mmol/L (ref 3.5–5.1)
SODIUM: 134 mmol/L — AB (ref 135–145)
Total Protein: 7.3 g/dL (ref 6.0–8.3)

## 2014-08-29 LAB — CBC WITH DIFFERENTIAL/PLATELET
BASOS PCT: 0 % (ref 0–1)
Basophils Absolute: 0 10*3/uL (ref 0.0–0.1)
EOS ABS: 0 10*3/uL (ref 0.0–0.7)
Eosinophils Relative: 0 % (ref 0–5)
HCT: 44.3 % (ref 39.0–52.0)
HEMOGLOBIN: 15.5 g/dL (ref 13.0–17.0)
LYMPHS ABS: 0.4 10*3/uL — AB (ref 0.7–4.0)
Lymphocytes Relative: 3 % — ABNORMAL LOW (ref 12–46)
MCH: 31.9 pg (ref 26.0–34.0)
MCHC: 35 g/dL (ref 30.0–36.0)
MCV: 91.2 fL (ref 78.0–100.0)
Monocytes Absolute: 0.5 10*3/uL (ref 0.1–1.0)
Monocytes Relative: 4 % (ref 3–12)
NEUTROS ABS: 13.1 10*3/uL — AB (ref 1.7–7.7)
NEUTROS PCT: 93 % — AB (ref 43–77)
PLATELETS: 125 10*3/uL — AB (ref 150–400)
RBC: 4.86 MIL/uL (ref 4.22–5.81)
RDW: 13.2 % (ref 11.5–15.5)
WBC: 14.1 10*3/uL — ABNORMAL HIGH (ref 4.0–10.5)

## 2014-08-29 LAB — URINALYSIS, ROUTINE W REFLEX MICROSCOPIC
GLUCOSE, UA: NEGATIVE mg/dL
Hgb urine dipstick: NEGATIVE
KETONES UR: 15 mg/dL — AB
LEUKOCYTES UA: NEGATIVE
NITRITE: NEGATIVE
PH: 6 (ref 5.0–8.0)
Protein, ur: NEGATIVE mg/dL
SPECIFIC GRAVITY, URINE: 1.027 (ref 1.005–1.030)
Urobilinogen, UA: 0.2 mg/dL (ref 0.0–1.0)

## 2014-08-29 MED ORDER — PROCHLORPERAZINE EDISYLATE 5 MG/ML IJ SOLN
10.0000 mg | Freq: Four times a day (QID) | INTRAMUSCULAR | Status: DC | PRN
  Administered 2014-08-30: 10 mg via INTRAVENOUS
  Filled 2014-08-29: qty 2

## 2014-08-29 MED ORDER — SODIUM CHLORIDE 0.9 % IV BOLUS (SEPSIS)
1000.0000 mL | Freq: Once | INTRAVENOUS | Status: AC
  Administered 2014-08-30: 1000 mL via INTRAVENOUS

## 2014-08-29 MED ORDER — DIPHENHYDRAMINE HCL 50 MG/ML IJ SOLN
25.0000 mg | Freq: Once | INTRAMUSCULAR | Status: AC
  Administered 2014-08-30: 25 mg via INTRAVENOUS
  Filled 2014-08-29: qty 1

## 2014-08-29 MED ORDER — ACETAMINOPHEN 325 MG PO TABS
650.0000 mg | ORAL_TABLET | Freq: Once | ORAL | Status: AC
  Administered 2014-08-29: 650 mg via ORAL
  Filled 2014-08-29: qty 2

## 2014-08-29 NOTE — ED Provider Notes (Signed)
CSN: [id number]     Arrival date & time 08/29/14  1750 History   First MD Initiated Contact with Patient 08/29/14 2306     This chart was scribed for No att. providers found by Forrestine Him, ED Scribe. This patient was seen in room OTFC/OTF and the patient's care was started 7:02 AM.   Chief Complaint  Patient presents with  . Headache   The history is provided by the patient. No language interpreter was used.    HPI Comments: Lawrence Owen is a 59 y.o. male with a PMHx of neck dissection sugery several years ago and cancer who presents to the Emergency Department complaining of intermittent, ongoing bilateral temporal HA x 3 weeks. Currently pain rated 7-8/10. No aggravating or alleviating factors at this time. Pt also reports fever of 102 at its highest onset today, sharp chest pain that has now resolved, groin pain, cough, and bilateral otalgia. Mr. Redner also mentions hearing a "blood rush" to both sides of his neck. Headaches began before fever, cough, and otalgia. No known history of migraines. Denies vomiting. Denies worst headache of his life. States he has a history of a blocked carotid artery. He has not tried any OTC medications or home remedies to help manage symptoms. No dysuria, urinary frequency, or urinary urgency. No weakness, numbness, or visual changes. No known allergies to medications.   Past Medical History  Diagnosis Date  . Cancer    History reviewed. No pertinent past surgical history. No family history on file. History  Substance Use Topics  . Smoking status: Current Every Day Smoker  . Smokeless tobacco: Not on file  . Alcohol Use: Yes    Review of Systems  Constitutional: Positive for fever. Negative for chills.  HENT: Positive for ear pain.   Eyes: Negative for visual disturbance.  Respiratory: Positive for cough.   Cardiovascular: Positive for chest pain.  Genitourinary: Negative for dysuria, urgency and frequency.  Musculoskeletal: Positive for  arthralgias.  Neurological: Positive for headaches. Negative for weakness and numbness.  All other systems reviewed and are negative.     Allergies  Review of patient's allergies indicates no known allergies.  Home Medications   Prior to Admission medications   Medication Sig Start Date End Date Taking? Authorizing Provider  Cyanocobalamin (VITAMIN B-12 PO) Take 1 tablet by mouth daily.   Yes Historical Provider, MD  Flaxseed, Linseed, (FLAX SEEDS PO) Take 1 tablet by mouth daily.   Yes Historical Provider, MD  ibuprofen (ADVIL,MOTRIN) 200 MG tablet Take 600-800 mg by mouth every 6 (six) hours as needed for moderate pain.   Yes Historical Provider, MD  Multiple Vitamin (MULTIVITAMIN WITH MINERALS) TABS tablet Take 1 tablet by mouth daily.   Yes Historical Provider, MD  naproxen sodium (ANAPROX) 220 MG tablet Take 440 mg by mouth 3 (three) times daily as needed (pain).   Yes Historical Provider, MD  Tetrahydrozoline HCl (EYE DROPS OP) Place 2 drops into both eyes daily as needed (dryness).   Yes Historical Provider, MD  amoxicillin (AMOXIL) 500 MG capsule Take 2 capsules (1,000 mg total) by mouth 2 (two) times daily. 08/30/14   Merryl Hacker, MD  fluticasone (FLONASE) 50 MCG/ACT nasal spray Place 2 sprays into both nostrils daily. 08/30/14   Merryl Hacker, MD  HYDROcodone-acetaminophen (NORCO/VICODIN) 5-325 MG per tablet Take 1 tablet by mouth every 6 (six) hours as needed. 08/30/14   Merryl Hacker, MD   Triage Vitals: BP 141/77 mmHg  Pulse 84  Temp(Src) 99.1 F (37.3 C) (Oral)  Resp 14  SpO2 96%   Physical Exam  Constitutional: He is oriented to person, place, and time. No distress.  HENT:  Head: Normocephalic and atraumatic.  Right Ear: External ear normal.  Mouth/Throat: Oropharynx is clear and moist. No oropharyngeal exudate.  Effusion noted behind left ear with erythema and bulging  Eyes: Pupils are equal, round, and reactive to light.  Neck: Normal range of motion.  Neck supple.  No meningismus noted, carotid bruit noted on the right  Cardiovascular: Normal rate, regular rhythm and normal heart sounds.   No murmur heard. Pulmonary/Chest: Effort normal and breath sounds normal. No respiratory distress. He has no wheezes.  Abdominal: Soft. Bowel sounds are normal. There is no tenderness. There is no rebound.  Musculoskeletal: He exhibits no edema.  Lymphadenopathy:    He has no cervical adenopathy.  Neurological: He is alert and oriented to person, place, and time.  5 out of 5 strength in all 4 extremities  Skin: Skin is warm and dry.  Psychiatric: He has a normal mood and affect.  Nursing note and vitals reviewed.   ED Course  Procedures (including critical care time)  DIAGNOSTIC STUDIES: Oxygen Saturation is 100% on RA, Normal by my interpretation.    COORDINATION OF CARE: 7:02 AM-Discussed treatment plan with pt at bedside and pt agreed to plan.  a   Labs Review Labs Reviewed  CBC WITH DIFFERENTIAL/PLATELET - Abnormal; Notable for the following:    WBC 14.1 (*)    Platelets 125 (*)    Neutrophils Relative % 93 (*)    Neutro Abs 13.1 (*)    Lymphocytes Relative 3 (*)    Lymphs Abs 0.4 (*)    All other components within normal limits  COMPREHENSIVE METABOLIC PANEL - Abnormal; Notable for the following:    Sodium 134 (*)    Glucose, Bld 128 (*)    Total Bilirubin 1.4 (*)    GFR calc non Af Amer 61 (*)    GFR calc Af Amer 71 (*)    Anion gap 4 (*)    All other components within normal limits  URINALYSIS, ROUTINE W REFLEX MICROSCOPIC - Abnormal; Notable for the following:    Color, Urine AMBER (*)    Bilirubin Urine SMALL (*)    Ketones, ur 15 (*)    All other components within normal limits  RAPID STREP SCREEN  CULTURE, GROUP A STREP  TROPONIN I    Imaging Review Ct Angio Head W/cm &/or Wo Cm  08/30/2014   CLINICAL DATA:  Headache for several weeks with intermittent dizziness. Headache after surgery for throat cancer 3 weeks  ago. Reported RIGHT walked carotid artery.  EXAM: CT ANGIOGRAPHY HEAD AND NECK  TECHNIQUE: Multidetector CT imaging of the head and neck was performed using the standard protocol during bolus administration of intravenous contrast. Multiplanar CT image reconstructions and MIPs were obtained to evaluate the vascular anatomy. Carotid stenosis measurements (when applicable) are obtained utilizing NASCET criteria, using the distal internal carotid diameter as the denominator.  CONTRAST:  61mL OMNIPAQUE IOHEXOL 350 MG/ML SOLN  COMPARISON:  CT of the head September 15, 2005  FINDINGS: CT HEAD  The ventricles and sulci are normal. No intraparenchymal hemorrhage, mass effect nor midline shift. No acute large vascular territory infarcts. No abnormal intracranial enhancement though, examination is not specifically tailored for evaluation of potential intracranial metastasis. Minimal calcific atherosclerosis of the carotid siphons.  No abnormal extra-axial fluid collections. Basal cisterns  are patent.  No skull fracture. The included ocular globes and orbital contents are non-suspicious. The mastoid aircells and included paranasal sinuses are well-aerated. Patient is edentulous. Left maxillary incisor unerupted tooth.  CTA NECK  Normal appearance of the thoracic arch, normal branch pattern. The origins of the innominate, left Common carotid artery and subclavian artery are widely patent.  Bilateral Common carotid arteries are widely patent, coursing in a straight line fashion. Mild intimal thickening of the bilateral Common carotid arteries. Normal appearance of the carotid bifurcations without hemodynamically significant stenosis by NASCET criteria. 1-2 mm eccentric calcific atherosclerosis of the RIGHT carotid bulb. Patent bilateral cervical internal carotid arteries. 30-50% narrowing of the LEFT mid cervical internal carotid artery approximately 2.5 cm from the origin, without dissection or atherosclerosis.  The LEFT vertebral  artery arises directly from the aortic arch, normal variant. Approximately 50% stenosis of the origin the RIGHT vertebral artery which may be related to scarring. Normal appearance of the vertebral arteries, which appear widely patent.  No hemodynamically significant stenosis by NASCET criteria. No dissection, no pseudoaneurysm. No abnormal luminal irregularity. No contrast extravasation.  Biapical lung scarring. Remote RIGHT distal clavicle fracture. RIGHT neck dissection, resected internal jugular vein, nodal dissection. Effacement of the LEFT piriform sinus. Severe C6-7 degenerative disc resulting in moderate to severe neural foraminal narrowing.  CTA HEAD  Anterior circulation: Normal appearance of the cervical internal carotid arteries, petrous, cavernous and supra clinoid internal carotid arteries. Mild calcific atherosclerosis of the carotid siphons. Dominant RIGHT A1 segment. Widely patent anterior communicating artery. Normal appearance of the anterior and middle cerebral arteries.  Posterior circulation: Codominant vertebral arteries with normal appearance of the vertebral arteries, vertebrobasilar junction and basilar artery, as well as main branch vessels. Normal appearance of the posterior cerebral arteries.  No large vessel occlusion, hemodynamically significant stenosis, dissection, contrast extravasation or aneurysm within the anterior nor posterior circulation. Mild luminal irregularity of the intracranial vessels most consistent with atherosclerosis.  IMPRESSION: CT HEAD:  Normal CT of the head with and without contrast for age.  CTA NECK: Atherosclerosis without large vessel occlusion. Approximately 50% stenosis the RIGHT vertebral artery origin.  Approximately 30-50% narrowing of LEFT internal carotid artery, do not definitely associated with atherosclerosis, it is unclear if this reflects prior injury.  Status post RIGHT neck dissection. Effacement of the LEFT piriform sinus, recommend direct  inspection.  CTA HEAD: No large vessel occlusion or hemodynamically significant stenosis. Mild luminal irregularity of the intracranial vessels likely represents atherosclerosis.   Electronically Signed   By: Elon Alas   On: 08/30/2014 01:22   Dg Chest 2 View  08/30/2014   CLINICAL DATA:  Temporal headache, fever, cough. History of COPD in cancer.  EXAM: CHEST  2 VIEW  COMPARISON:  Chest radiograph January 24, 2006  FINDINGS: Cardiomediastinal silhouette is unremarkable. The lungs are clear without pleural effusions or focal consolidations. Similarly increased lung volumes with mild chronic interstitial changes consistent with COPD. Trachea projects midline and there is no pneumothorax. Soft tissue planes and included osseous structures are non-suspicious.  IMPRESSION: COPD, no acute cardiopulmonary process.   Electronically Signed   By: Elon Alas   On: 08/30/2014 02:13   Ct Angio Neck W/cm &/or Wo/cm  08/30/2014   CLINICAL DATA:  Headache for several weeks with intermittent dizziness. Headache after surgery for throat cancer 3 weeks ago. Reported RIGHT walked carotid artery.  EXAM: CT ANGIOGRAPHY HEAD AND NECK  TECHNIQUE: Multidetector CT imaging of the head and neck was performed  using the standard protocol during bolus administration of intravenous contrast. Multiplanar CT image reconstructions and MIPs were obtained to evaluate the vascular anatomy. Carotid stenosis measurements (when applicable) are obtained utilizing NASCET criteria, using the distal internal carotid diameter as the denominator.  CONTRAST:  57mL OMNIPAQUE IOHEXOL 350 MG/ML SOLN  COMPARISON:  CT of the head September 15, 2005  FINDINGS: CT HEAD  The ventricles and sulci are normal. No intraparenchymal hemorrhage, mass effect nor midline shift. No acute large vascular territory infarcts. No abnormal intracranial enhancement though, examination is not specifically tailored for evaluation of potential intracranial metastasis.  Minimal calcific atherosclerosis of the carotid siphons.  No abnormal extra-axial fluid collections. Basal cisterns are patent.  No skull fracture. The included ocular globes and orbital contents are non-suspicious. The mastoid aircells and included paranasal sinuses are well-aerated. Patient is edentulous. Left maxillary incisor unerupted tooth.  CTA NECK  Normal appearance of the thoracic arch, normal branch pattern. The origins of the innominate, left Common carotid artery and subclavian artery are widely patent.  Bilateral Common carotid arteries are widely patent, coursing in a straight line fashion. Mild intimal thickening of the bilateral Common carotid arteries. Normal appearance of the carotid bifurcations without hemodynamically significant stenosis by NASCET criteria. 1-2 mm eccentric calcific atherosclerosis of the RIGHT carotid bulb. Patent bilateral cervical internal carotid arteries. 30-50% narrowing of the LEFT mid cervical internal carotid artery approximately 2.5 cm from the origin, without dissection or atherosclerosis.  The LEFT vertebral artery arises directly from the aortic arch, normal variant. Approximately 50% stenosis of the origin the RIGHT vertebral artery which may be related to scarring. Normal appearance of the vertebral arteries, which appear widely patent.  No hemodynamically significant stenosis by NASCET criteria. No dissection, no pseudoaneurysm. No abnormal luminal irregularity. No contrast extravasation.  Biapical lung scarring. Remote RIGHT distal clavicle fracture. RIGHT neck dissection, resected internal jugular vein, nodal dissection. Effacement of the LEFT piriform sinus. Severe C6-7 degenerative disc resulting in moderate to severe neural foraminal narrowing.  CTA HEAD  Anterior circulation: Normal appearance of the cervical internal carotid arteries, petrous, cavernous and supra clinoid internal carotid arteries. Mild calcific atherosclerosis of the carotid siphons.  Dominant RIGHT A1 segment. Widely patent anterior communicating artery. Normal appearance of the anterior and middle cerebral arteries.  Posterior circulation: Codominant vertebral arteries with normal appearance of the vertebral arteries, vertebrobasilar junction and basilar artery, as well as main branch vessels. Normal appearance of the posterior cerebral arteries.  No large vessel occlusion, hemodynamically significant stenosis, dissection, contrast extravasation or aneurysm within the anterior nor posterior circulation. Mild luminal irregularity of the intracranial vessels most consistent with atherosclerosis.  IMPRESSION: CT HEAD:  Normal CT of the head with and without contrast for age.  CTA NECK: Atherosclerosis without large vessel occlusion. Approximately 50% stenosis the RIGHT vertebral artery origin.  Approximately 30-50% narrowing of LEFT internal carotid artery, do not definitely associated with atherosclerosis, it is unclear if this reflects prior injury.  Status post RIGHT neck dissection. Effacement of the LEFT piriform sinus, recommend direct inspection.  CTA HEAD: No large vessel occlusion or hemodynamically significant stenosis. Mild luminal irregularity of the intracranial vessels likely represents atherosclerosis.   Electronically Signed   By: Elon Alas   On: 08/30/2014 01:22     EKG Interpretation   Date/Time:  Saturday August 30 2014 00:21:35 EDT Ventricular Rate:  94 PR Interval:  152 QRS Duration: 133 QT Interval:  388 QTC Calculation: 485 R Axis:   94 Text Interpretation:  Sinus rhythm RBBB and LPFB Baseline wander in  lead(s) V6 Confirmed by Daion Ginsberg  MD, Aunica Dauphinee ([id number]) on 08/30/2014 2:36:25  AM      MDM   Final diagnoses:  URI, acute  Acute suppurative otitis media of left ear without spontaneous rupture of tympanic membrane, recurrence not specified  Acute nonintractable headache, unspecified headache type    Patient presents with multiple complaints. Is  difficult to piece together a clear timeline. It appears his headache has been ongoing for several weeks. Last several days he has developed otalgia, fever, sore throat and cough. In the waiting room he developed chest pain which is now resolved. Nontoxic on exam. Vital signs notable for temperature of 102.1. Physical exam is remarkable for carotid bruit, neurologic exam is benign, no evidence of meningismus. Low suspicion at this time of meningitis and timing of headache and fever do not seem to correlate.  labs obtained. Mild leukocytosis with a left shift. Strep screen negative.  Chest x-ray without evidence of pneumonia.  CT angiogram of the head and neck obtained. CTA negative for occlusion but does show known stenosis.  Regarding patient's chest pain, it was self-limited. Doubt ACS. Troponin negative and EKG reassuring.  Given fever and evidence of otitis on exam, will elect to treat with amoxicillin. Could also be viral in nature.  After history, exam, and medical workup I feel the patient has been appropriately medically screened and is safe for discharge home. Pertinent diagnoses were discussed with the patient. Patient was given return precautions.  I personally performed the services described in this documentation, which was scribed in my presence. The recorded information has been reviewed and is accurate.   Merryl Hacker, MD 08/30/14 [phone]

## 2014-08-29 NOTE — ED Notes (Signed)
The pt is c/o a headache since he had surgery 3 weeks ago for cancer of the throat.  When he had surgery he was found to have blocked carotid on the rt

## 2014-08-29 NOTE — ED Notes (Signed)
The pt now tells me he had surgery on his neck a long time ago he just started hearing the blood rush through his carotids 3 weeks ago.  ?????

## 2014-08-29 NOTE — ED Notes (Signed)
C/o flank pain also

## 2014-08-29 NOTE — ED Notes (Signed)
The pt reports that last pm when he was lying down he could hear his blood rushing through his carotid artery on the rt

## 2014-08-30 ENCOUNTER — Encounter (HOSPITAL_COMMUNITY): Payer: Self-pay | Admitting: Radiology

## 2014-08-30 ENCOUNTER — Emergency Department (HOSPITAL_COMMUNITY): Payer: BLUE CROSS/BLUE SHIELD

## 2014-08-30 LAB — RAPID STREP SCREEN (MED CTR MEBANE ONLY): Streptococcus, Group A Screen (Direct): NEGATIVE

## 2014-08-30 LAB — TROPONIN I: Troponin I: <0.03 ng/mL (ref <0.031)

## 2014-08-30 MED ORDER — AMOXICILLIN 500 MG PO CAPS: 1000.0000 mg | ORAL_CAPSULE | Freq: Two times a day (BID) | ORAL | Status: DC

## 2014-08-30 MED ORDER — HYDROCODONE-ACETAMINOPHEN 5-325 MG PO TABS: 1.0000 | ORAL_TABLET | Freq: Four times a day (QID) | ORAL | Status: DC | PRN

## 2014-08-30 MED ORDER — IOHEXOL 350 MG/ML SOLN
50.0000 mL | Freq: Once | INTRAVENOUS | Status: AC | PRN
  Administered 2014-08-30: 50 mL via INTRAVENOUS

## 2014-08-30 MED ORDER — FLUTICASONE PROPIONATE 50 MCG/ACT NA SUSP: 2.0000 | Freq: Every day | NASAL | Status: DC

## 2014-08-30 NOTE — Discharge Instructions (Signed)
Otitis Media With Effusion Otitis media with effusion is the presence of fluid in the middle ear. This is a common problem in children, which often follows ear infections. It may be present for weeks or longer after the infection. Unlike an acute ear infection, otitis media with effusion refers only to fluid behind the ear drum and not infection. Children with repeated ear and sinus infections and allergy problems are the most likely to get otitis media with effusion. CAUSES  The most frequent cause of the fluid buildup is dysfunction of the eustachian tubes. These are the tubes that drain fluid in the ears to the back of the nose (nasopharynx). SYMPTOMS   The main symptom of this condition is hearing loss. As a result, you or your child may:  Listen to the TV at a loud volume.  Not respond to questions.  Ask "what" often when spoken to.  Mistake or confuse one sound or word for another.  There may be a sensation of fullness or pressure but usually not pain. DIAGNOSIS   Your health care provider will diagnose this condition by examining you or your child's ears.  Your health care provider may test the pressure in you or your child's ear with a tympanometer.  A hearing test may be conducted if the problem persists. TREATMENT   Treatment depends on the duration and the effects of the effusion.  Antibiotics, decongestants, nose drops, and cortisone-type drugs (tablets or nasal spray) may not be helpful.  Children with persistent ear effusions may have delayed language or behavioral problems. Children at risk for developmental delays in hearing, learning, and speech may require referral to a specialist earlier than children not at risk.  You or your child's health care provider may suggest a referral to an ear, nose, and throat surgeon for treatment. The following may help restore normal hearing:  Drainage of fluid.  Placement of ear tubes (tympanostomy tubes).  Removal of adenoids  (adenoidectomy). HOME CARE INSTRUCTIONS   Avoid secondhand smoke.  Infants who are breastfed are less likely to have this condition.  Avoid feeding infants while they are lying flat.  Avoid known environmental allergens.  Avoid people who are sick. SEEK MEDICAL CARE IF:   Hearing is not better in 3 months.  Hearing is worse.  Ear pain.  Drainage from the ear.  Dizziness. MAKE SURE YOU:   Understand these instructions.  Will watch your condition.  Will get help right away if you are not doing well or get worse. Document Released: 06/23/2004 Document Revised: 09/30/2013 Document Reviewed: 12/11/2012 [facility] Patient Information 2015 [place], [place]. This information is not intended to replace advice given to you by your health care provider. Make sure you discuss any questions you have with your health care provider. General Headache Without Cause A headache is pain or discomfort felt around the head or neck area. The specific cause of a headache may not be found. There are many causes and types of headaches. A few common ones are:  Tension headaches.  Migraine headaches.  Cluster headaches.  Chronic daily headaches. HOME CARE INSTRUCTIONS   Keep all follow-up appointments with your caregiver or any specialist referral.  Only take over-the-counter or prescription medicines for pain or discomfort as directed by your caregiver.  Lie down in a dark, quiet room when you have a headache.  Keep a headache journal to find out what may trigger your migraine headaches. For example, write down:  What you eat and drink.  How much  sleep you get.  Any change to your diet or medicines.  Try massage or other relaxation techniques.  Put ice packs or heat on the head and neck. Use these 3 to 4 times per day for 15 to 20 minutes each time, or as needed.  Limit stress.  Sit up straight, and do not tense your muscles.  Quit smoking if you smoke.  Limit alcohol  use.  Decrease the amount of caffeine you drink, or stop drinking caffeine.  Eat and sleep on a regular schedule.  Get 7 to 9 hours of sleep, or as recommended by your caregiver.  Keep lights dim if bright lights bother you and make your headaches worse. SEEK MEDICAL CARE IF:   You have problems with the medicines you were prescribed.  Your medicines are not working.  You have a change from the usual headache.  You have nausea or vomiting. SEEK IMMEDIATE MEDICAL CARE IF:   Your headache becomes severe.  You have a fever.  You have a stiff neck.  You have loss of vision.  You have muscular weakness or loss of muscle control.  You start losing your balance or have trouble walking.  You feel faint or pass out.  You have severe symptoms that are different from your first symptoms. MAKE SURE YOU:   Understand these instructions.  Will watch your condition.  Will get help right away if you are not doing well or get worse. Document Released: 05/16/2005 Document Revised: 08/08/2011 Document Reviewed: 06/01/2011 [facility] Patient Information 2015 [place], [place]. This information is not intended to replace advice given to you by your health care provider. Make sure you discuss any questions you have with your health care provider.

## 2014-08-30 NOTE — ED Notes (Signed)
Dr. Horton at the bedside.  

## 2014-09-01 LAB — CULTURE, GROUP A STREP: Strep A Culture: NEGATIVE

## 2016-02-05 DIAGNOSIS — R079 Chest pain, unspecified: Secondary | ICD-10-CM | POA: Diagnosis not present

## 2016-02-05 DIAGNOSIS — R7989 Other specified abnormal findings of blood chemistry: Secondary | ICD-10-CM | POA: Diagnosis not present

## 2016-02-05 DIAGNOSIS — Z720 Tobacco use: Secondary | ICD-10-CM | POA: Diagnosis not present

## 2016-02-05 DIAGNOSIS — R946 Abnormal results of thyroid function studies: Secondary | ICD-10-CM | POA: Diagnosis not present

## 2016-03-01 ENCOUNTER — Encounter: Payer: Self-pay | Admitting: *Deleted

## 2016-03-02 ENCOUNTER — Ambulatory Visit (INDEPENDENT_AMBULATORY_CARE_PROVIDER_SITE_OTHER): Payer: BLUE CROSS/BLUE SHIELD | Admitting: Pulmonary Disease

## 2016-03-02 ENCOUNTER — Encounter: Payer: Self-pay | Admitting: Pulmonary Disease

## 2016-03-02 VITALS — BP 114/74 | HR 53 | Ht 69.0 in | Wt 152.4 lb

## 2016-03-02 DIAGNOSIS — J439 Emphysema, unspecified: Secondary | ICD-10-CM | POA: Diagnosis not present

## 2016-03-02 MED ORDER — TIOTROPIUM BROMIDE-OLODATEROL 2.5-2.5 MCG/ACT IN AERS: 2.0000 | INHALATION_SPRAY | Freq: Every day | RESPIRATORY_TRACT | 0 refills | Status: AC

## 2016-03-02 NOTE — Progress Notes (Signed)
Lawrence Owen    ZS:[id number]    [birth year]  Primary Care Physician:Owen, Lawrence Mantis  Referring Physician: Mack Hook, MD [address] [place], [place] [address]  Chief complaint:  Consult for evaluation of COPD  HPI: Lawrence Owen is a 60 year old with past medical history of COPD. He was diagnosed in 2012 but is not on any inhalers. He does not recall getting any pulmonary function tests in the past. He reports worsening dyspnea on exertion with cough, sputum production, wheezing for the past 1 year. He had a chest x-ray last month at his primary care office which showed hyperinflation consistent with COPD. He has daily nighttime symptoms of snoring, witnessed apneas.  He is an active smoker. He used to smoke up to a pack a day but is now down to 5 cigarettes per day. 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 He has history of tongue cancer with radiation, radical neck dissection and surgery in 2001. He had a PEG tube at that time but no tracheotomy.  Outpatient Encounter Prescriptions as of 03/02/2016  Medication Sig  . Cyanocobalamin (VITAMIN B-12 PO) Take 1 tablet by mouth daily.  . Flaxseed, Linseed, (FLAX SEEDS PO) Take 1 tablet by mouth daily.  . fluticasone (FLONASE) 50 MCG/ACT nasal spray Place 2 sprays into both nostrils daily.  Marland Kitchen ibuprofen (ADVIL,MOTRIN) 200 MG tablet Take 600-800 mg by mouth every 6 (six) hours as needed for moderate pain.  Marland Kitchen levothyroxine (SYNTHROID, LEVOTHROID) 100 MCG tablet Take 100 mcg by mouth daily before breakfast.  . Multiple Vitamin (MULTIVITAMIN WITH MINERALS) TABS tablet Take 1 tablet by mouth daily.  . naproxen sodium (ANAPROX) 220 MG tablet Take 440 mg by mouth 3 (three) times daily as needed (pain).  . Tetrahydrozoline HCl (EYE DROPS OP) Place 2 drops into both eyes daily as needed (dryness).  . [DISCONTINUED] amoxicillin (AMOXIL)  500 MG capsule Take 2 capsules (1,000 mg total) by mouth 2 (two) times daily. (Patient not taking: Reported on 03/02/2016)  . [DISCONTINUED] HYDROcodone-acetaminophen (NORCO/VICODIN) 5-325 MG per tablet Take 1 tablet by mouth every 6 (six) hours as needed. (Patient not taking: Reported on 03/02/2016)   No facility-administered encounter medications on file as of 03/02/2016.     Allergies as of 03/02/2016  . (No Known Allergies)    Past Medical History:  Diagnosis Date  . Cancer of tongue [facility])     Past Surgical History:  Procedure Laterality Date  . NECK SURGERY  2003   lymph node removed    Family History  Problem Relation Age of Onset  . Emphysema Father     Social History   Social History  . Marital status: Single    Spouse name: N/A  . Number of children: N/A  . Years of education: N/A   Occupational History  . Not on file.   Social History Main Topics  . Smoking status: Current Every Day Smoker    Packs/day: 0.25    Years: 29.00    Types: Cigarettes  . Smokeless tobacco: Never Used  . Alcohol use Yes     Comment: 0-3 beers per day  . Drug use:     Frequency: 2.0 times per week    Types: Marijuana  . Sexual activity: Not on file   Other Topics Concern  . 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  Has a degree in architectural engineering        Review of systems: Review of Systems  Constitutional: Negative for fever and chills.  HENT: Negative.   Eyes: Negative for blurred vision.  Respiratory: as per HPI  Cardiovascular: Negative for chest pain and palpitations.  Gastrointestinal: Negative for vomiting, diarrhea, blood per rectum. Genitourinary: Negative for dysuria, urgency, frequency and hematuria.  Musculoskeletal: Negative for myalgias, back pain and joint pain.  Skin: Negative for  itching and rash.  Neurological: Negative for dizziness, tremors, focal weakness, seizures and loss of consciousness.  Endo/Heme/Allergies: Negative for environmental allergies.  Psychiatric/Behavioral: Negative for depression, suicidal ideas and hallucinations.  All other systems reviewed and are negative.   Physical Exam: Blood pressure 114/74, pulse (!) 53, height 5\' 9"  (1.753 m), weight 152 lb 6.4 oz (69.1 kg), SpO2 99 %. Gen:      No acute distress HEENT:  EOMI, sclera anicteric Neck:     No masses; no thyromegaly Lungs:    Clear to auscultation bilaterally; normal respiratory effort CV:         Regular rate and rhythm; no murmurs Abd:      + bowel sounds; soft, non-tender; no palpable masses, no distension Ext:    No edema; adequate peripheral perfusion Skin:      Warm and dry; no rash Neuro: alert and oriented x 3 Psych: normal mood and affect  Data Reviewed: CXR 02/05/16- hyperinflation, no infiltrate. Images reviewed.  Assessment:  #1 COPD Lawrence Owen likely has COPD based on his smoking history, symptoms. He is not on any inhalers currently and will benefit from initiation of one. I'll start him on Sitolto, suspicion for asthma is low. He will be scheduled for pulmonary function test.  #2 OSA He likely has OSA based on his symptoms of snoring, witnessed apneas. We discussed getting a sleep study however he is afraid of his co-pay and thinks he'll not tolerate a CPAP mask. I'll readdress this at his next visit.  #3 Active smoker We discussed smoking cessation. He's tried nicotine replacement therapies and Wellbutrin without any success. He is interested in quitting and is requesting Chantix. He is a candidate for low-dose screening CTs.  Plan/Recommendations: - Start stiolto - PFTs - Start chantix for smoking cessation - Referral for low dose screening CT  Lawrence Garfinkel MD [facility] Pager [phone] 03/02/2016, 10:39 AM  CC: Lawrence Hook, MD

## 2016-03-02 NOTE — Patient Instructions (Signed)
Will start you on stiolto inhaler. We'll also start Chantix for smoking cessation You will be scheduled for pulmonary function tests. We will refer you to the lung cancer screening program  Return to clinic in 3 months.

## 2016-03-22 ENCOUNTER — Encounter: Payer: Self-pay | Admitting: Pulmonary Disease

## 2016-06-14 ENCOUNTER — Ambulatory Visit: Payer: BLUE CROSS/BLUE SHIELD | Admitting: Pulmonary Disease

## 2016-07-19 ENCOUNTER — Telehealth: Payer: Self-pay | Admitting: *Deleted

## 2016-07-19 ENCOUNTER — Encounter: Payer: Self-pay | Admitting: Pulmonary Disease

## 2016-07-19 NOTE — Progress Notes (Signed)
Pt could not be set up for low dose screening CT due to inability to reach patient after multiple attempts. He cancelled last visit and PFTs Please see if we can reschedule for PFTs and clinic visit to review and discuss screening CTs. Send a letter if not able to reach by phone. Thanks  PM

## 2016-07-19 NOTE — Telephone Encounter (Signed)
Error

## 2016-07-25 ENCOUNTER — Encounter: Payer: Self-pay | Admitting: *Deleted

## 2016-07-25 NOTE — Progress Notes (Signed)
Letter has been sent to pt as we have not been able to reach him by phone.

## 2017-06-09 DIAGNOSIS — R05 Cough: Secondary | ICD-10-CM | POA: Diagnosis not present

## 2017-06-09 DIAGNOSIS — E039 Hypothyroidism, unspecified: Secondary | ICD-10-CM | POA: Diagnosis not present

## 2017-06-09 DIAGNOSIS — M62830 Muscle spasm of back: Secondary | ICD-10-CM | POA: Diagnosis not present

## 2017-06-09 DIAGNOSIS — Z125 Encounter for screening for malignant neoplasm of prostate: Secondary | ICD-10-CM | POA: Diagnosis not present

## 2017-06-09 DIAGNOSIS — J449 Chronic obstructive pulmonary disease, unspecified: Secondary | ICD-10-CM | POA: Diagnosis not present

## 2017-07-06 DIAGNOSIS — Z720 Tobacco use: Secondary | ICD-10-CM | POA: Diagnosis not present

## 2017-07-06 DIAGNOSIS — Z85810 Personal history of malignant neoplasm of tongue: Secondary | ICD-10-CM | POA: Diagnosis not present

## 2017-07-06 DIAGNOSIS — J449 Chronic obstructive pulmonary disease, unspecified: Secondary | ICD-10-CM | POA: Diagnosis not present

## 2017-07-06 DIAGNOSIS — E039 Hypothyroidism, unspecified: Secondary | ICD-10-CM | POA: Diagnosis not present

## 2017-10-17 DIAGNOSIS — J449 Chronic obstructive pulmonary disease, unspecified: Secondary | ICD-10-CM | POA: Diagnosis not present

## 2017-10-17 DIAGNOSIS — Z0000 Encounter for general adult medical examination without abnormal findings: Secondary | ICD-10-CM | POA: Diagnosis not present

## 2017-10-17 DIAGNOSIS — G44229 Chronic tension-type headache, not intractable: Secondary | ICD-10-CM | POA: Diagnosis not present

## 2017-10-17 DIAGNOSIS — E039 Hypothyroidism, unspecified: Secondary | ICD-10-CM | POA: Diagnosis not present

## 2018-03-03 ENCOUNTER — Emergency Department (HOSPITAL_COMMUNITY): Payer: BLUE CROSS/BLUE SHIELD

## 2018-03-03 ENCOUNTER — Other Ambulatory Visit: Payer: Self-pay

## 2018-03-03 ENCOUNTER — Encounter (HOSPITAL_COMMUNITY): Payer: Self-pay | Admitting: *Deleted

## 2018-03-03 ENCOUNTER — Observation Stay (HOSPITAL_COMMUNITY)
Admission: EM | Admit: 2018-03-03 | Discharge: 2018-03-04 | Disposition: A | Discharge status: Home / Self Care | Payer: BLUE CROSS/BLUE SHIELD | Attending: Internal Medicine | Admitting: Internal Medicine

## 2018-03-03 DIAGNOSIS — E039 Hypothyroidism, unspecified: Secondary | ICD-10-CM | POA: Diagnosis present

## 2018-03-03 DIAGNOSIS — F1090 Alcohol use, unspecified, uncomplicated: Secondary | ICD-10-CM

## 2018-03-03 DIAGNOSIS — I639 Cerebral infarction, unspecified: Secondary | ICD-10-CM | POA: Diagnosis not present

## 2018-03-03 DIAGNOSIS — E7800 Pure hypercholesterolemia, unspecified: Secondary | ICD-10-CM | POA: Diagnosis not present

## 2018-03-03 DIAGNOSIS — R030 Elevated blood-pressure reading, without diagnosis of hypertension: Secondary | ICD-10-CM | POA: Insufficient documentation

## 2018-03-03 DIAGNOSIS — R270 Ataxia, unspecified: Secondary | ICD-10-CM | POA: Diagnosis not present

## 2018-03-03 DIAGNOSIS — I6529 Occlusion and stenosis of unspecified carotid artery: Secondary | ICD-10-CM | POA: Insufficient documentation

## 2018-03-03 DIAGNOSIS — F1099 Alcohol use, unspecified with unspecified alcohol-induced disorder: Secondary | ICD-10-CM | POA: Diagnosis not present

## 2018-03-03 DIAGNOSIS — F17210 Nicotine dependence, cigarettes, uncomplicated: Secondary | ICD-10-CM | POA: Insufficient documentation

## 2018-03-03 DIAGNOSIS — R519 Headache, unspecified: Secondary | ICD-10-CM

## 2018-03-03 DIAGNOSIS — Z85810 Personal history of malignant neoplasm of tongue: Secondary | ICD-10-CM | POA: Diagnosis not present

## 2018-03-03 DIAGNOSIS — Z7289 Other problems related to lifestyle: Secondary | ICD-10-CM

## 2018-03-03 DIAGNOSIS — I779 Disorder of arteries and arterioles, unspecified: Secondary | ICD-10-CM

## 2018-03-03 DIAGNOSIS — I10 Essential (primary) hypertension: Secondary | ICD-10-CM | POA: Diagnosis not present

## 2018-03-03 DIAGNOSIS — I739 Peripheral vascular disease, unspecified: Secondary | ICD-10-CM

## 2018-03-03 DIAGNOSIS — R51 Headache: Secondary | ICD-10-CM | POA: Diagnosis not present

## 2018-03-03 DIAGNOSIS — Z789 Other specified health status: Secondary | ICD-10-CM

## 2018-03-03 DIAGNOSIS — I6523 Occlusion and stenosis of bilateral carotid arteries: Secondary | ICD-10-CM | POA: Diagnosis not present

## 2018-03-03 DIAGNOSIS — R42 Dizziness and giddiness: Secondary | ICD-10-CM | POA: Diagnosis not present

## 2018-03-03 LAB — COMPREHENSIVE METABOLIC PANEL
ALT: 20 U/L (ref 0–44)
AST: 25 U/L (ref 15–41)
Albumin: 4.2 g/dL (ref 3.5–5.0)
Alkaline Phosphatase: 54 U/L (ref 38–126)
Anion gap: 9 (ref 5–15)
BUN: 11 mg/dL (ref 8–23)
CHLORIDE: 104 mmol/L (ref 98–111)
CO2: 26 mmol/L (ref 22–32)
Calcium: 9.3 mg/dL (ref 8.9–10.3)
Creatinine, Ser: 1.06 mg/dL (ref 0.61–1.24)
Glucose, Bld: 99 mg/dL (ref 70–99)
POTASSIUM: 3.6 mmol/L (ref 3.5–5.1)
SODIUM: 139 mmol/L (ref 135–145)
Total Bilirubin: 0.7 mg/dL (ref 0.3–1.2)
Total Protein: 6.6 g/dL (ref 6.5–8.1)

## 2018-03-03 LAB — I-STAT CHEM 8, ED
BUN: 16 mg/dL (ref 8–23)
CHLORIDE: 104 mmol/L (ref 98–111)
Calcium, Ion: 1.04 mmol/L — ABNORMAL LOW (ref 1.15–1.40)
Creatinine, Ser: 1.1 mg/dL (ref 0.61–1.24)
Glucose, Bld: 97 mg/dL (ref 70–99)
HEMATOCRIT: 51 % (ref 39.0–52.0)
HEMOGLOBIN: 17.3 g/dL — AB (ref 13.0–17.0)
POTASSIUM: 4.7 mmol/L (ref 3.5–5.1)
Sodium: 137 mmol/L (ref 135–145)
TCO2: 29 mmol/L (ref 22–32)

## 2018-03-03 LAB — DIFFERENTIAL
Abs Immature Granulocytes: 0 10*3/uL (ref 0.0–0.1)
BASOS ABS: 0 10*3/uL (ref 0.0–0.1)
BASOS PCT: 1 %
EOS ABS: 0.1 10*3/uL (ref 0.0–0.7)
Eosinophils Relative: 1 %
Immature Granulocytes: 0 %
Lymphocytes Relative: 18 %
Lymphs Abs: 1.4 10*3/uL (ref 0.7–4.0)
MONOS PCT: 4 %
Monocytes Absolute: 0.3 10*3/uL (ref 0.1–1.0)
NEUTROS ABS: 5.7 10*3/uL (ref 1.7–7.7)
NEUTROS PCT: 76 %

## 2018-03-03 LAB — CBC
HCT: 51.8 % (ref 39.0–52.0)
HEMOGLOBIN: 16.9 g/dL (ref 13.0–17.0)
MCH: 31.1 pg (ref 26.0–34.0)
MCHC: 32.6 g/dL (ref 30.0–36.0)
MCV: 95.2 fL (ref 78.0–100.0)
PLATELETS: 196 10*3/uL (ref 150–400)
RBC: 5.44 MIL/uL (ref 4.22–5.81)
RDW: 13 % (ref 11.5–15.5)
WBC: 7.5 10*3/uL (ref 4.0–10.5)

## 2018-03-03 LAB — PROTIME-INR
INR: 1.04
PROTHROMBIN TIME: 13.5 s (ref 11.4–15.2)

## 2018-03-03 LAB — APTT: APTT: 36 s (ref 24–36)

## 2018-03-03 LAB — I-STAT TROPONIN, ED: Troponin i, poc: 0 ng/mL (ref 0.00–0.08)

## 2018-03-03 MED ORDER — DIPHENHYDRAMINE HCL 50 MG/ML IJ SOLN
25.0000 mg | Freq: Once | INTRAMUSCULAR | Status: AC
  Administered 2018-03-03: 25 mg via INTRAVENOUS
  Filled 2018-03-03: qty 1

## 2018-03-03 MED ORDER — SODIUM CHLORIDE 0.9 % IV BOLUS
1000.0000 mL | Freq: Once | INTRAVENOUS | Status: AC
  Administered 2018-03-03: 1000 mL via INTRAVENOUS

## 2018-03-03 MED ORDER — PROCHLORPERAZINE EDISYLATE 10 MG/2ML IJ SOLN
10.0000 mg | Freq: Once | INTRAMUSCULAR | Status: AC
  Administered 2018-03-03: 10 mg via INTRAVENOUS
  Filled 2018-03-03: qty 2

## 2018-03-03 MED ORDER — GADOBUTROL 1 MMOL/ML IV SOLN
7.0000 mL | Freq: Once | INTRAVENOUS | Status: AC | PRN
  Administered 2018-03-03: 7 mL via INTRAVENOUS

## 2018-03-03 NOTE — ED Notes (Signed)
Pt updated on MRI wait time

## 2018-03-03 NOTE — ED Notes (Signed)
Patient transported to CT 

## 2018-03-03 NOTE — ED Triage Notes (Signed)
The pt had a lt sided headache since yesterday with an off steady gait leaning to the lt and he al,ost fell today  Not dizzy nauseated  2 nights ago he woke up with indigestion

## 2018-03-03 NOTE — ED Provider Notes (Addendum)
[facility] Emergency Department Provider Note MRN:  [id number]  Arrival date & time: 03/04/18     Chief Complaint   Headache   History of Present Illness   Lawrence Owen is a 62 y.o. year-old male with a history of head neck cancer presenting to the ED with chief complaint of headache.  Patient experienced a sudden onset left-sided headache yesterday at 10 AM.  The headache was mild, located behind the left eye radiating behind the left ear.  Described as a burning sensation, compared it to prior sinus infections.  However not endorsing any recent sinus drainage or congestion.  Began noticing balance issues after the onset of this headache.  Consistently falling to the left throughout the day yesterday.  One fall yesterday afternoon, no head trauma, no pain or injury since the fall.  This morning the balance issues have resolved, the headache is improved but still present.  Denies any recent fevers, no neck pain, no numbness or weakness to the arms or legs.  Has had a similar headache in the past.  Review of Systems  A complete 10 system review of systems was obtained and all systems are negative except as noted in the HPI and PMH.   Patient's Health History    Past Medical History:  Diagnosis Date  . Cancer of tongue [facility])     Past Surgical History:  Procedure Laterality Date  . NECK SURGERY  2003   lymph node removed    Family History  Problem Relation Age of Onset  . Emphysema Father     Social History   Socioeconomic History  . Marital status: Single    Spouse name: Not on file  . Number of children: Not on file  . Years of education: Not on file  . Highest education level: Not on file  Occupational History  . Not on file  Social Needs  . Financial resource strain: Not on file  . Food insecurity:    Worry: Not on file    Inability: Not on file  . Transportation needs:    Medical: Not on file    Non-medical: Not on file  Tobacco Use  .  Smoking status: Current Every Day Smoker    Packs/day: 0.25    Years: 29.00    Pack years: 7.25    Types: Cigarettes  . Smokeless tobacco: Never Used  Substance and Sexual Activity  . Alcohol use: Yes    Comment: 0-3 beers per day  . Drug use: Yes    Frequency: 2.0 times per week    Types: Marijuana  . Sexual activity: Not on file  Lifestyle  . Physical activity:    Days per week: Not on file    Minutes per session: Not on file  . Stress: Not on file  Relationships  . Social connections:    Talks on phone: Not on file    Gets together: Not on file    Attends religious service: Not on file    Active member of club or organization: Not on file    Attends meetings of clubs or organizations: Not on file    Relationship status: Not on file  . Intimate partner violence:    Fear of current or ex partner: Not on file    Emotionally abused: Not on file    Physically abused: Not on file    Forced sexual activity: Not on file  Other Topics Concern  . 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  Has a degree in architectural engineering     Physical Exam  Vital Signs and Nursing Notes reviewed Vitals:   03/03/18 2340 03/04/18 0000  BP: (!) 212/150 (!) 214/129  Pulse: (!) 53 (!) 57  Resp: 15 15  Temp:    SpO2: 99% 99%    CONSTITUTIONAL: Well-appearing, NAD NEURO:  Alert and oriented x 3, no focal deficits EYES:  eyes equal and reactive ENT/NECK:  no LAD, no JVD CARDIO: Regular rate, well-perfused, normal S1 and S2 PULM:  CTAB no wheezing or rhonchi GI/GU:  normal bowel sounds, non-distended, non-tender MSK/SPINE:  No gross deformities, no edema SKIN:  no rash, atraumatic PSYCH:  Appropriate speech and behavior  Diagnostic and Interventional Summary    EKG Interpretation  Date/Time:    Ventricular Rate:    PR  Interval:    QRS Duration:   QT Interval:    QTC Calculation:   R Axis:     Text Interpretation:        Labs Reviewed  I-STAT CHEM 8, ED - Abnormal; Notable for the following components:      Result Value   Calcium, Ion 1.04 (*)    Hemoglobin 17.3 (*)    All other components within normal limits  PROTIME-INR  APTT  CBC  DIFFERENTIAL  COMPREHENSIVE METABOLIC PANEL  I-STAT TROPONIN, ED  CBG MONITORING, ED    CT HEAD WO CONTRAST  Final Result    MR BRAIN WO CONTRAST    (Results Pending)  MR MRA HEAD WO CONTRAST    (Results Pending)  MR Angiogram Neck W or Wo Contrast    (Results Pending)    Medications  prochlorperazine (COMPAZINE) injection 10 mg (10 mg Intravenous Given 03/03/18 1811)  diphenhydrAMINE (BENADRYL) injection 25 mg (25 mg Intravenous Given 03/03/18 1811)  sodium chloride 0.9 % bolus 1,000 mL (0 mLs Intravenous Stopped 03/03/18 1915)  gadobutrol (GADAVIST) 1 MMOL/ML injection 7 mL (7 mLs Intravenous Contrast Given 03/03/18 2304)     Procedures Critical Care Critical Care Documentation Critical care time provided by me (excluding procedures): 36 minutes  Condition necessitating critical care: acute ischemic stroke  Components of critical care management: reviewing of prior records, laboratory and imaging interpretation, frequent re-examination and reassessment of vital signs and neurological exam.    ED Course and Medical Decision Making  I have reviewed the triage vital signs and the nursing notes.  Pertinent labs & imaging results that were available during my care of the patient were reviewed by me and considered in my medical decision making (see below for details).  Considering stroke versus subarachnoid hemorrhage in this 62 year old male with sudden onset headache.  No fevers, no meningismus.  Feels less likely to be subarachnoid hemorrhage given the mildness of the headache.  CT head to follow, will consider MRI/MRA versus LP if unremarkable.  MRI  still pending, signed out to Dr. Roxanne Mins at shift change.    Barth Kirks. Sedonia Small, MD [place] [email or web address]  Final Clinical Impressions(s) / ED Diagnoses     ICD-10-CM   1. Nonintractable headache, unspecified chronicity pattern, unspecified headache type R51     ED Discharge Orders    None         Maudie Flakes, MD 03/04/18 6295    Sedonia Small,  Barth Kirks, MD 03/13/18 [phone]

## 2018-03-03 NOTE — ED Notes (Signed)
No drifts speech normal equal grips

## 2018-03-03 NOTE — ED Notes (Signed)
Patient transported to MRI 

## 2018-03-03 NOTE — ED Notes (Signed)
ED Provider at bedside. 

## 2018-03-04 ENCOUNTER — Other Ambulatory Visit: Payer: Self-pay

## 2018-03-04 ENCOUNTER — Observation Stay (HOSPITAL_BASED_OUTPATIENT_CLINIC_OR_DEPARTMENT_OTHER): Payer: BLUE CROSS/BLUE SHIELD

## 2018-03-04 DIAGNOSIS — Z789 Other specified health status: Secondary | ICD-10-CM

## 2018-03-04 DIAGNOSIS — E7800 Pure hypercholesterolemia, unspecified: Secondary | ICD-10-CM

## 2018-03-04 DIAGNOSIS — I779 Disorder of arteries and arterioles, unspecified: Secondary | ICD-10-CM

## 2018-03-04 DIAGNOSIS — I10 Essential (primary) hypertension: Secondary | ICD-10-CM | POA: Diagnosis present

## 2018-03-04 DIAGNOSIS — I639 Cerebral infarction, unspecified: Secondary | ICD-10-CM | POA: Diagnosis not present

## 2018-03-04 DIAGNOSIS — I739 Peripheral vascular disease, unspecified: Secondary | ICD-10-CM

## 2018-03-04 DIAGNOSIS — Z7289 Other problems related to lifestyle: Secondary | ICD-10-CM

## 2018-03-04 DIAGNOSIS — E039 Hypothyroidism, unspecified: Secondary | ICD-10-CM

## 2018-03-04 LAB — LIPID PANEL
Cholesterol: 201 mg/dL — ABNORMAL HIGH (ref 0–200)
HDL: 52 mg/dL (ref >40)
LDL CALC: 134 mg/dL — AB (ref 0–99)
TRIGLYCERIDES: 75 mg/dL (ref <150)
Total CHOL/HDL Ratio: 3.9 RATIO
VLDL: 15 mg/dL (ref 0–40)

## 2018-03-04 LAB — RAPID URINE DRUG SCREEN, HOSP PERFORMED
AMPHETAMINES: NOT DETECTED
Barbiturates: NOT DETECTED
Benzodiazepines: NOT DETECTED
Cocaine: NOT DETECTED
Opiates: NOT DETECTED
Tetrahydrocannabinol: POSITIVE — AB

## 2018-03-04 LAB — HEMOGLOBIN A1C
Hgb A1c MFr Bld: 5.4 % (ref 4.8–5.6)
MEAN PLASMA GLUCOSE: 108.28 mg/dL

## 2018-03-04 MED ORDER — LORAZEPAM 2 MG/ML IJ SOLN: 1.0000 mg | Freq: Four times a day (QID) | INTRAMUSCULAR | Status: DC | PRN

## 2018-03-04 MED ORDER — CLOPIDOGREL BISULFATE 75 MG PO TABS: 75.0000 mg | ORAL_TABLET | Freq: Every day | ORAL | Status: DC

## 2018-03-04 MED ORDER — ONDANSETRON HCL 4 MG/2ML IJ SOLN: 4.0000 mg | Freq: Three times a day (TID) | INTRAMUSCULAR | Status: DC | PRN

## 2018-03-04 MED ORDER — CLOPIDOGREL BISULFATE 75 MG PO TABS: 75.0000 mg | ORAL_TABLET | Freq: Every day | ORAL | 0 refills | Status: DC

## 2018-03-04 MED ORDER — LORAZEPAM 1 MG PO TABS: 1.0000 mg | ORAL_TABLET | Freq: Four times a day (QID) | ORAL | Status: DC | PRN

## 2018-03-04 MED ORDER — CLOPIDOGREL BISULFATE 75 MG PO TABS
300.0000 mg | ORAL_TABLET | Freq: Every day | ORAL | Status: DC
  Administered 2018-03-04: 300 mg via ORAL
  Filled 2018-03-04: qty 4

## 2018-03-04 MED ORDER — ZOLPIDEM TARTRATE 5 MG PO TABS: 5.0000 mg | ORAL_TABLET | Freq: Every evening | ORAL | Status: DC | PRN

## 2018-03-04 MED ORDER — FOLIC ACID 1 MG PO TABS
1.0000 mg | ORAL_TABLET | Freq: Every day | ORAL | Status: DC
  Administered 2018-03-04: 1 mg via ORAL
  Filled 2018-03-04: qty 1

## 2018-03-04 MED ORDER — ATORVASTATIN CALCIUM 80 MG PO TABS: 80.0000 mg | ORAL_TABLET | Freq: Every day | ORAL | 0 refills | Status: DC

## 2018-03-04 MED ORDER — THIAMINE HCL 100 MG PO TABS: 100.0000 mg | ORAL_TABLET | Freq: Every day | ORAL | 0 refills | Status: DC

## 2018-03-04 MED ORDER — HYDRALAZINE HCL 20 MG/ML IJ SOLN: 5.0000 mg | INTRAMUSCULAR | Status: DC | PRN

## 2018-03-04 MED ORDER — THIAMINE HCL 100 MG/ML IJ SOLN: 100.0000 mg | Freq: Every day | INTRAMUSCULAR | Status: DC

## 2018-03-04 MED ORDER — EMERGEN-C IMMUNE PO PACK: PACK | Freq: Every day | ORAL | Status: DC

## 2018-03-04 MED ORDER — ATORVASTATIN CALCIUM 80 MG PO TABS
80.0000 mg | ORAL_TABLET | Freq: Every day | ORAL | Status: DC
  Administered 2018-03-04: 80 mg via ORAL
  Filled 2018-03-04: qty 1

## 2018-03-04 MED ORDER — ATORVASTATIN CALCIUM 80 MG PO TABS: 80.0000 mg | ORAL_TABLET | Freq: Every day | ORAL | Status: DC

## 2018-03-04 MED ORDER — FOLIC ACID 1 MG PO TABS: 1.0000 mg | ORAL_TABLET | Freq: Every day | ORAL | 0 refills | Status: DC

## 2018-03-04 MED ORDER — ACETAMINOPHEN 500 MG PO TABS: 500.0000 mg | ORAL_TABLET | Freq: Four times a day (QID) | ORAL | Status: DC | PRN

## 2018-03-04 MED ORDER — ADULT MULTIVITAMIN W/MINERALS CH
1.0000 | ORAL_TABLET | Freq: Every day | ORAL | Status: DC
  Administered 2018-03-04: 1 via ORAL
  Filled 2018-03-04: qty 1

## 2018-03-04 MED ORDER — ASPIRIN 300 MG RE SUPP: 300.0000 mg | Freq: Every day | RECTAL | Status: DC

## 2018-03-04 MED ORDER — ASPIRIN 81 MG PO CHEW: 81.0000 mg | CHEWABLE_TABLET | Freq: Every day | ORAL | 0 refills | Status: DC

## 2018-03-04 MED ORDER — ENOXAPARIN SODIUM 40 MG/0.4ML ~~LOC~~ SOLN
40.0000 mg | SUBCUTANEOUS | Status: DC
  Administered 2018-03-04: 40 mg via SUBCUTANEOUS
  Filled 2018-03-04: qty 0.4

## 2018-03-04 MED ORDER — ASPIRIN 325 MG PO TABS: 325.0000 mg | ORAL_TABLET | Freq: Every day | ORAL | Status: DC

## 2018-03-04 MED ORDER — LEVOTHYROXINE SODIUM 100 MCG PO TABS
100.0000 ug | ORAL_TABLET | Freq: Every day | ORAL | Status: DC
  Administered 2018-03-04: 100 ug via ORAL
  Filled 2018-03-04: qty 1

## 2018-03-04 MED ORDER — LORAZEPAM 2 MG/ML IJ SOLN: 0.0000 mg | Freq: Two times a day (BID) | INTRAMUSCULAR | Status: DC

## 2018-03-04 MED ORDER — SODIUM CHLORIDE 0.9 % IV SOLN
INTRAVENOUS | Status: DC
  Administered 2018-03-04: 04:00:00 via INTRAVENOUS

## 2018-03-04 MED ORDER — STROKE: EARLY STAGES OF RECOVERY BOOK
Freq: Once | Status: AC
  Administered 2018-03-04: 04:00:00
  Filled 2018-03-04: qty 1

## 2018-03-04 MED ORDER — VITAMIN B-1 100 MG PO TABS
100.0000 mg | ORAL_TABLET | Freq: Every day | ORAL | Status: DC
  Administered 2018-03-04: 100 mg via ORAL
  Filled 2018-03-04: qty 1

## 2018-03-04 MED ORDER — LORAZEPAM 2 MG/ML IJ SOLN: 0.0000 mg | Freq: Four times a day (QID) | INTRAMUSCULAR | Status: DC

## 2018-03-04 MED ORDER — NICOTINE 21 MG/24HR TD PT24
21.0000 mg | MEDICATED_PATCH | Freq: Every day | TRANSDERMAL | Status: DC
  Administered 2018-03-04: 21 mg via TRANSDERMAL
  Filled 2018-03-04: qty 1

## 2018-03-04 MED ORDER — SENNOSIDES-DOCUSATE SODIUM 8.6-50 MG PO TABS: 1.0000 | ORAL_TABLET | Freq: Every evening | ORAL | Status: DC | PRN

## 2018-03-04 MED ORDER — ASPIRIN 81 MG PO CHEW: 81.0000 mg | CHEWABLE_TABLET | Freq: Every day | ORAL | Status: DC

## 2018-03-04 NOTE — Progress Notes (Signed)
  Echocardiogram 2D Echocardiogram has been performed.  Lawrence Owen 03/04/2018, 11:25 AM

## 2018-03-04 NOTE — H&P (Signed)
History and Physical    FAIZ WEBER WUJ:[id number] DOB: 05-Sep-1955 DOA: 03/03/2018  Referring MD/NP/PA:   PCP: Marda Stalker, PA-C   Patient coming from:  The patient is coming from home.  At baseline, pt is independent for most of ADL.  Chief Complaint: left sided head pressure feeling, left facial numbness, leaning to the left side, poor balance.  HPI: Lawrence Owen is a 62 y.o. male with medical history significant of hypertension, hyperlipidemia, hypothyroidism, tobacco abuse, alcohol use, carotid artery stenosis, tongue cancer 2002 (s/p of XRT and neck lymph node dissection, not following oncologist), who presents with left sided head pressure feeling, left facial numbness, leaning to the left side, poor balance.  Patient states that he has been doing well until Friday morning, at about 8:00 when he started feeling pressure in the left side of the head, and numbness in the left face.  He also has poor balance, leaning toward left side.  He states that he has bilateral leg numbness.  No numbness, weakness in the arms.  No vision change or hearing loss.  He has right facial droop, which is chronic issue.  Patient denies chest pain, shortness breath, cough, fever or chills.  No nausea, vomiting, diarrhea, abdominal pain, symptoms of UTI.  Patient has not taken blood pressure medications or cholesterol medications for long time.  He states that he drinks 3 beers each day, almost every day.  ED Course: pt was found to have WBC 7.5, INR 1.04, creatinine 1.10, elevated blood pressure 215/129, bradycardia, no tachypnea, oxygen satting 99% on room air, no fever.  Patient is placed on telemetry bed for observation.  CT-head showed small hypodense focus in the anterior right basal ganglia region. MRI confirmed 12 mm subacute infarction within the right caudate head and anterior limb of internal capsule. Dr. Lorraine Lax of neurology was consulted.   # MRA head: Patent anterior and posterior  intracranial circulation. No large vessel occlusion, aneurysm, or significant stenosis is identified. # MRA neck: 1. Right proximal ICA moderate 60% stenosis. 2. Left proximal ICA moderate 60% stenosis. 3. Approximately mild stenosis of vertebral artery origins. 4. No dissection or aneurysm of the carotid and vertebral arteries.  Review of Systems:   General: no fevers, chills, no body weight gain, has fatigue HEENT: no blurry vision, hearing changes or sore throat Respiratory: no dyspnea, coughing, wheezing CV: no chest pain, no palpitations GI: no nausea, vomiting, abdominal pain, diarrhea, constipation GU: no dysuria, burning on urination, increased urinary frequency, hematuria  Ext: no leg edema Neuro: left sided head pressure feeling,left facial numbness, leaning to the left side, poor balance. Skin: no rash, no skin tear. MSK: No muscle spasm, no deformity, no limitation of range of movement in spin Heme: No easy bruising.  Travel history: No recent long distant travel.  Allergy: No Known Allergies  Past Medical History:  Diagnosis Date  . Cancer of tongue [facility])     Past Surgical History:  Procedure Laterality Date  . NECK SURGERY  2003   lymph node removed    Social History:  reports that he has been smoking cigarettes. He has a 7.25 pack-year smoking history. He has never used smokeless tobacco. He reports that he drinks alcohol. He reports that he has current or past drug history. Drug: Marijuana. Frequency: 2.00 times per week.  Family History:  Family History  Problem Relation Age of Onset  . Emphysema Father      Prior to Admission medications   Medication Sig  Start Date End Date Taking? Authorizing Provider  acetaminophen (TYLENOL) 500 MG tablet Take 500 mg by mouth every 6 (six) hours as needed for mild pain or fever.   Yes [provider]  Cyanocobalamin (VITAMIN B-12 PO) Take 1 tablet by mouth daily.   Yes [provider]    levothyroxine (SYNTHROID, LEVOTHROID) 100 MCG tablet Take 100 mcg by mouth daily before breakfast.   Yes [provider]  Multiple Vitamins-Minerals (EMERGEN-C IMMUNE PO) Take 1 Package by mouth daily.   Yes [provider]  UNABLE TO FIND Apply 1 application topically daily as needed (Neck and Shoulder Cramping). Med Name: CBD and essential oil muscle rub   Yes [provider]  fluticasone (FLONASE) 50 MCG/ACT nasal spray Place 2 sprays into both nostrils daily. Patient not taking: Reported on 03/03/2018 08/30/14   Horton, Barbette Hair, MD  ibuprofen (ADVIL,MOTRIN) 200 MG tablet Take 400 mg by mouth every 6 (six) hours as needed for moderate pain.     [provider]    Physical Exam: Vitals:   03/04/18 0000 03/04/18 0030 03/04/18 0100 03/04/18 0130  BP: (!) 214/129 (!) 216/122 (!) 221/110 (!) 215/136  Pulse: (!) 57 (!) 51 (!) 56 (!) 54  Resp: 15 15 16 12   Temp:      TempSrc:      SpO2: 99% 98% 97% 98%  Weight:      Height:       General: Not in acute distress HEENT:       Eyes: PERRL, EOMI, no scleral icterus.       ENT: No discharge from the ears and nose, no pharynx injection, no tonsillar enlargement.        Neck: No JVD, has left carotid bruit, no mass felt. Heme: No neck lymph node enlargement. Cardiac: S1/S2, RRR, No murmurs, No gallops or rubs. Respiratory: No rales, wheezing, rhonchi or rubs. GI: Soft, nondistended, nontender, no rebound pain, no organomegaly, BS present. GU: No hematuria Ext: No pitting leg edema bilaterally. 2+DP/PT pulse bilaterally. Musculoskeletal: No joint deformities, No joint redness or warmth, no limitation of ROM in spin. Skin: No rashes.  Neuro: Alert, oriented X3, cranial nerves II-XII grossly intact, moves all extremities normally. Muscle strength 5/5 in all extremities, sensation to light touch intact. Brachial reflex 2+ bilaterally. Negative Babinski's sign. Normal finger to nose test. Psych: Patient is not  psychotic, no suicidal or hemocidal ideation.  Labs on Admission: I have personally reviewed following labs and imaging studies  CBC: Recent Labs  Lab 03/03/18 1525 03/03/18 1531  WBC 7.5  --   NEUTROABS 5.7  --   HGB 16.9 17.3*  HCT 51.8 51.0  MCV 95.2  --   PLT 196  --    Basic Metabolic Panel: Recent Labs  Lab 03/03/18 1525 03/03/18 1531  NA 139 137  K 3.6 4.7  CL 104 104  CO2 26  --   GLUCOSE 99 97  BUN 11 16  CREATININE 1.06 1.10  CALCIUM 9.3  --    GFR: Estimated Creatinine Clearance: 64.2 mL/min (by C-G formula based on SCr of 1.1 mg/dL). Liver Function Tests: Recent Labs  Lab 03/03/18 1525  AST 25  ALT 20  ALKPHOS 54  BILITOT 0.7  PROT 6.6  ALBUMIN 4.2   No results for input(s): LIPASE, AMYLASE in the last 168 hours. No results for input(s): AMMONIA in the last 168 hours. Coagulation Profile: Recent Labs  Lab 03/03/18 1525  INR 1.04  Cardiac Enzymes: No results for input(s): CKTOTAL, CKMB, CKMBINDEX, TROPONINI in the last 168 hours. BNP (last 3 results) No results for input(s): PROBNP in the last 8760 hours. HbA1C: No results for input(s): HGBA1C in the last 72 hours. CBG: No results for input(s): GLUCAP in the last 168 hours. Lipid Profile: No results for input(s): CHOL, HDL, LDLCALC, TRIG, CHOLHDL, LDLDIRECT in the last 72 hours. Thyroid Function Tests: No results for input(s): TSH, T4TOTAL, FREET4, T3FREE, THYROIDAB in the last 72 hours. Anemia Panel: No results for input(s): VITAMINB12, FOLATE, FERRITIN, TIBC, IRON, RETICCTPCT in the last 72 hours. Urine analysis:    Component Value Date/Time   COLORURINE AMBER (A) 08/29/2014 1840   APPEARANCEUR CLEAR 08/29/2014 1840   LABSPEC 1.027 08/29/2014 1840   PHURINE 6.0 08/29/2014 1840   GLUCOSEU NEGATIVE 08/29/2014 1840   HGBUR NEGATIVE 08/29/2014 1840   BILIRUBINUR SMALL (A) 08/29/2014 1840   KETONESUR 15 (A) 08/29/2014 1840   PROTEINUR NEGATIVE 08/29/2014 1840   UROBILINOGEN 0.2  08/29/2014 1840   NITRITE NEGATIVE 08/29/2014 1840   LEUKOCYTESUR NEGATIVE 08/29/2014 1840   Sepsis Labs: @LABRCNTIP (procalcitonin:4,lacticidven:4) )No results found for this or any previous visit (from the past 240 hour(s)).   Radiological Exams on Admission: Ct Head Wo Contrast  Result Date: 03/03/2018 CLINICAL DATA:  Left-sided headache. Ataxia. Weakness in the lower extremities bilaterally, left greater than right. No reported injury. EXAM: CT HEAD WITHOUT CONTRAST TECHNIQUE: Contiguous axial images were obtained from the base of the skull through the vertex without intravenous contrast. COMPARISON:  08/30/2014 head CT. FINDINGS: Brain: Small hypodense focus in the anterior right basal ganglia region without mass-effect. No evidence of parenchymal hemorrhage or extra-axial fluid collection. No mass lesion, mass effect, or midline shift. No CT evidence of acute infarction. Cerebral volume is age appropriate. No ventriculomegaly. Vascular: No acute abnormality. Skull: No evidence of calvarial fracture. Sinuses/Orbits: The visualized paranasal sinuses are essentially clear. Other:  The mastoid air cells are unopacified. IMPRESSION: Small hypodense focus in the anterior right basal ganglia region without mass-effect, cannot exclude a small infarct of indeterminate chronicity, possibly acute/subacute. No acute intracranial hemorrhage. Brain MRI correlation may be obtained as clinically warranted. Electronically Signed   By: Ilona Sorrel M.D.   On: 03/03/2018 17:18   Mr Jodene Nam Head Wo Contrast  Result Date: 03/04/2018 CLINICAL DATA:  62 y/o M; left-sided headache. Ataxia. Weakness in lower extremities, left-greater-than-right. EXAM: MRI HEAD WITHOUT CONTRAST MRA HEAD WITHOUT CONTRAST MRA NECK WITHOUT AND WITH CONTRAST TECHNIQUE: Multiplanar, multiecho pulse sequences of the brain and surrounding structures were obtained without intravenous contrast. Angiographic images of the Circle of Willis were obtained  using MRA technique without intravenous contrast. Angiographic images of the neck were obtained using MRA technique without and with intravenous contrast. Carotid stenosis measurements (when applicable) are obtained utilizing NASCET criteria, using the distal internal carotid diameter as the denominator. CONTRAST:  7 cc Gadavist COMPARISON:  09/16/2005 MRI of the head. 03/03/2018 CT head. 08/30/2014 CT angiogram head and neck. FINDINGS: MRI HEAD FINDINGS Brain: 12 mm hyperintensity on the diffusion weighted sequence with intermediate diffusion on ADC compatible with subacute infarction involving the right anterior caudate head and anterior limb of internal capsule (series 3, image 28). No associated hemorrhage or mass effect. Few nonspecific T2 FLAIR hyperintensities in subcortical and periventricular white matter are compatible with mild chronic microvascular ischemic changes for age. Mild volume loss of the brain. No focal mass effect, extra-axial collection, hydrocephalus, or herniation. There are few scattered foci of susceptibility  hypointensity within the right temporal lobe and left inferior cerebellum compatible with hemosiderin deposition of chronic microhemorrhage. No associated mass lesion to suggest cavernoma. The distribution is nonspecific. Vascular: As below. Skull and upper cervical spine: Normal marrow signal. Sinuses/Orbits: Negative. Other: None. MRA HEAD FINDINGS Internal carotid arteries:  Patent. Anterior cerebral arteries:  Patent. Middle cerebral arteries: Patent. Anterior communicating artery: Not identified, likely hypoplastic or absent. Posterior communicating arteries: Not identified, likely hypoplastic or absent. Posterior cerebral arteries:  Patent. Basilar artery:  Patent. Vertebral arteries:  Patent. No evidence of high-grade stenosis, large vessel occlusion, or aneurysm. MRA NECK FINDINGS Aortic arch: Patent.  Four vessel arch. Right common carotid artery: Patent. Right internal  carotid artery: Moderate 60% stenosis of the proximal right ICA (series 1401, image 93). Right vertebral artery: Patent. Mild less 50% stenosis of the vertebral artery origin. Left common carotid artery: Patent. Left Internal carotid artery: Moderate 60% stenosis of the proximal left ICA (series 1401, image 84). Left Vertebral artery: Patent. Mild-to-moderate stenosis of the left vertebral artery origin, assessment degraded by motion artifact at level of aortic arch. No findings of dissection or aneurysm. Biapical pleuroparenchymal scarring of the lungs. IMPRESSION: MRI head: 1. 12 mm subacute infarction within the right caudate head and anterior limb of internal capsule. No associated hemorrhage or mass effect. 2. Mild for age chronic microvascular ischemic changes and volume loss of the brain. MRA head: Patent anterior and posterior intracranial circulation. No large vessel occlusion, aneurysm, or significant stenosis is identified. MRA neck: 1. Right proximal ICA moderate 60% stenosis. 2. Left proximal ICA moderate 60% stenosis. 3. Approximately mild stenosis of vertebral artery origins. 4. No findings of dissection or aneurysm of the carotid and vertebral arteries. Electronically Signed   By: Kristine Garbe M.D.   On: 03/04/2018 00:22   Mr Angiogram Neck W Or Wo Contrast  Result Date: 03/04/2018 CLINICAL DATA:  62 y/o M; left-sided headache. Ataxia. Weakness in lower extremities, left-greater-than-right. EXAM: MRI HEAD WITHOUT CONTRAST MRA HEAD WITHOUT CONTRAST MRA NECK WITHOUT AND WITH CONTRAST TECHNIQUE: Multiplanar, multiecho pulse sequences of the brain and surrounding structures were obtained without intravenous contrast. Angiographic images of the Circle of Willis were obtained using MRA technique without intravenous contrast. Angiographic images of the neck were obtained using MRA technique without and with intravenous contrast. Carotid stenosis measurements (when applicable) are obtained  utilizing NASCET criteria, using the distal internal carotid diameter as the denominator. CONTRAST:  7 cc Gadavist COMPARISON:  09/16/2005 MRI of the head. 03/03/2018 CT head. 08/30/2014 CT angiogram head and neck. FINDINGS: MRI HEAD FINDINGS Brain: 12 mm hyperintensity on the diffusion weighted sequence with intermediate diffusion on ADC compatible with subacute infarction involving the right anterior caudate head and anterior limb of internal capsule (series 3, image 28). No associated hemorrhage or mass effect. Few nonspecific T2 FLAIR hyperintensities in subcortical and periventricular white matter are compatible with mild chronic microvascular ischemic changes for age. Mild volume loss of the brain. No focal mass effect, extra-axial collection, hydrocephalus, or herniation. There are few scattered foci of susceptibility hypointensity within the right temporal lobe and left inferior cerebellum compatible with hemosiderin deposition of chronic microhemorrhage. No associated mass lesion to suggest cavernoma. The distribution is nonspecific. Vascular: As below. Skull and upper cervical spine: Normal marrow signal. Sinuses/Orbits: Negative. Other: None. MRA HEAD FINDINGS Internal carotid arteries:  Patent. Anterior cerebral arteries:  Patent. Middle cerebral arteries: Patent. Anterior communicating artery: Not identified, likely hypoplastic or absent. Posterior communicating arteries: Not identified, likely  hypoplastic or absent. Posterior cerebral arteries:  Patent. Basilar artery:  Patent. Vertebral arteries:  Patent. No evidence of high-grade stenosis, large vessel occlusion, or aneurysm. MRA NECK FINDINGS Aortic arch: Patent.  Four vessel arch. Right common carotid artery: Patent. Right internal carotid artery: Moderate 60% stenosis of the proximal right ICA (series 1401, image 93). Right vertebral artery: Patent. Mild less 50% stenosis of the vertebral artery origin. Left common carotid artery: Patent. Left  Internal carotid artery: Moderate 60% stenosis of the proximal left ICA (series 1401, image 84). Left Vertebral artery: Patent. Mild-to-moderate stenosis of the left vertebral artery origin, assessment degraded by motion artifact at level of aortic arch. No findings of dissection or aneurysm. Biapical pleuroparenchymal scarring of the lungs. IMPRESSION: MRI head: 1. 12 mm subacute infarction within the right caudate head and anterior limb of internal capsule. No associated hemorrhage or mass effect. 2. Mild for age chronic microvascular ischemic changes and volume loss of the brain. MRA head: Patent anterior and posterior intracranial circulation. No large vessel occlusion, aneurysm, or significant stenosis is identified. MRA neck: 1. Right proximal ICA moderate 60% stenosis. 2. Left proximal ICA moderate 60% stenosis. 3. Approximately mild stenosis of vertebral artery origins. 4. No findings of dissection or aneurysm of the carotid and vertebral arteries. Electronically Signed   By: Kristine Garbe M.D.   On: 03/04/2018 00:22   Mr Brain Wo Contrast  Result Date: 03/04/2018 CLINICAL DATA:  62 y/o M; left-sided headache. Ataxia. Weakness in lower extremities, left-greater-than-right. EXAM: MRI HEAD WITHOUT CONTRAST MRA HEAD WITHOUT CONTRAST MRA NECK WITHOUT AND WITH CONTRAST TECHNIQUE: Multiplanar, multiecho pulse sequences of the brain and surrounding structures were obtained without intravenous contrast. Angiographic images of the Circle of Willis were obtained using MRA technique without intravenous contrast. Angiographic images of the neck were obtained using MRA technique without and with intravenous contrast. Carotid stenosis measurements (when applicable) are obtained utilizing NASCET criteria, using the distal internal carotid diameter as the denominator. CONTRAST:  7 cc Gadavist COMPARISON:  09/16/2005 MRI of the head. 03/03/2018 CT head. 08/30/2014 CT angiogram head and neck. FINDINGS: MRI HEAD  FINDINGS Brain: 12 mm hyperintensity on the diffusion weighted sequence with intermediate diffusion on ADC compatible with subacute infarction involving the right anterior caudate head and anterior limb of internal capsule (series 3, image 28). No associated hemorrhage or mass effect. Few nonspecific T2 FLAIR hyperintensities in subcortical and periventricular white matter are compatible with mild chronic microvascular ischemic changes for age. Mild volume loss of the brain. No focal mass effect, extra-axial collection, hydrocephalus, or herniation. There are few scattered foci of susceptibility hypointensity within the right temporal lobe and left inferior cerebellum compatible with hemosiderin deposition of chronic microhemorrhage. No associated mass lesion to suggest cavernoma. The distribution is nonspecific. Vascular: As below. Skull and upper cervical spine: Normal marrow signal. Sinuses/Orbits: Negative. Other: None. MRA HEAD FINDINGS Internal carotid arteries:  Patent. Anterior cerebral arteries:  Patent. Middle cerebral arteries: Patent. Anterior communicating artery: Not identified, likely hypoplastic or absent. Posterior communicating arteries: Not identified, likely hypoplastic or absent. Posterior cerebral arteries:  Patent. Basilar artery:  Patent. Vertebral arteries:  Patent. No evidence of high-grade stenosis, large vessel occlusion, or aneurysm. MRA NECK FINDINGS Aortic arch: Patent.  Four vessel arch. Right common carotid artery: Patent. Right internal carotid artery: Moderate 60% stenosis of the proximal right ICA (series 1401, image 93). Right vertebral artery: Patent. Mild less 50% stenosis of the vertebral artery origin. Left common carotid artery: Patent. Left Internal carotid  artery: Moderate 60% stenosis of the proximal left ICA (series 1401, image 84). Left Vertebral artery: Patent. Mild-to-moderate stenosis of the left vertebral artery origin, assessment degraded by motion artifact at  level of aortic arch. No findings of dissection or aneurysm. Biapical pleuroparenchymal scarring of the lungs. IMPRESSION: MRI head: 1. 12 mm subacute infarction within the right caudate head and anterior limb of internal capsule. No associated hemorrhage or mass effect. 2. Mild for age chronic microvascular ischemic changes and volume loss of the brain. MRA head: Patent anterior and posterior intracranial circulation. No large vessel occlusion, aneurysm, or significant stenosis is identified. MRA neck: 1. Right proximal ICA moderate 60% stenosis. 2. Left proximal ICA moderate 60% stenosis. 3. Approximately mild stenosis of vertebral artery origins. 4. No findings of dissection or aneurysm of the carotid and vertebral arteries. Electronically Signed   By: Kristine Garbe M.D.   On: 03/04/2018 00:22     EKG: Not done in ED, will get one.   Assessment/Plan Principal Problem:   Stroke (cerebrum) (HCC) Active Problems:   Hypothyroidism   HYPERCHOLESTEROLEMIA   Alcohol use   Essential hypertension   Stroke (cerebrum) [facility]): MRI showed MRI showed 12 mm subacute infarction within the right caudate head and anterior limb of internal capsule. MRA of neck showed 60% of stenosis of bilateral proximal ICA. Dr. Lorraine Lax was consulted.  - will place on tele bed for obs - will follow up Neurology's Recs.  - Allow permissive HTN in the setting of acute stroke  - start ASA, lipitor - fasting lipid panel and HbA1c - 2D transthoracic echocardiography  - swallowing screen. If fails, will get SLP - Check UDS  - PT/OT consult  Hypothyroidism: Last TSH was 4.783 on 10/14/111 -Continue home Synthroid -Check TSH  HYPERCHOLESTEROLEMIA: -start Lipitor 80 mg daily  Essential hypertension: Bp 215/129, not taking meds -IV hydralazine PRN for BP>220/120  Tobacco abuse and Alcohol abuse: -Did counseling about importance of quitting smoking -Nicotine patch -Did counseling about the importance of  quitting drinking -CIWA protocol   DVT ppx: SQ Lovenox Code Status: Full code Family Communication:  Yes, patient's wife   at bed side Disposition Plan:  Anticipate discharge back to previous home environment Consults called:  Dr. Lorraine Lax Admission status: Obs / tele     Date of Service 03/04/2018    Ivor Costa [facility] Pager [phone]  If 7PM-7AM, please contact night-coverage [email or web address] Password [email or web address] 03/04/2018, 1:57 AM

## 2018-03-04 NOTE — Consult Note (Signed)
Requesting Physician: Dr. Blaine Hamper    Chief Complaint: Sudden onset gait imbalance  History obtained from: Patient and Chart    HPI:                                                                                                                                       Lawrence Owen is an 62 y.o. male with past medical history of tongue cancer who presents emergency room after having sudden onset gait imbalance while doing laundry around 8:00 in the morning on 03-02-18.  He stated he when he bent down and got up he suddenly felt unsteady and since then been drifting to the left side having to hold onto things.  He also developed a left-sided headache.  Patient has history of radiation for his tongue cancer, and subsequently has known carotid stenosis.  Is currently not on any antiplatelets.  Date last known well: 03/02/18 Time last known well: 8 am tPA Given: no, outside window NIHSS: 0 Baseline MRS 0    Past Medical History:  Diagnosis Date  . Cancer of tongue [facility])     Past Surgical History:  Procedure Laterality Date  . NECK SURGERY  2003   lymph node removed    Family History  Problem Relation Age of Onset  . Emphysema Father    Social History:  reports that he has been smoking cigarettes. He has a 7.25 pack-year smoking history. He has never used smokeless tobacco. He reports that he drinks alcohol. He reports that he has current or past drug history. Drug: Marijuana. Frequency: 2.00 times per week.  Allergies: No Known Allergies  Medications:                                                                                                                        I reviewed home medications   ROS:  14 systems reviewed and negative except above    Examination:                                                                                                       General: Appears well-developed . Psych: Affect appropriate to situation Eyes: No scleral injection HENT: No OP obstrucion Head: Normocephalic.  Cardiovascular: Normal rate and regular rhythm.  Respiratory: Effort normal and breath sounds normal to anterior ascultation GI: Soft.  No distension. There is no tenderness.  Skin: WDI    Neurological Examination Mental Status: Alert, oriented, thought content appropriate.  Speech fluent without evidence of aphasia. Able to follow 3 step commands without difficulty. Cranial Nerves: II: Visual fields grossly normal,  III,IV, VI: ptosis not present, extra-ocular motions intact bilaterally, pupils equal, round, reactive to light and accommodation V,VII: smile symmetric, facial light touch sensation normal bilaterally VIII: hearing normal bilaterally IX,X: uvula rises symmetrically XI: bilateral shoulder shrug XII: midline tongue extension Motor: Right : Upper extremity   5/5    Left:     Upper extremity   5/5  Lower extremity   5/5     Lower extremity   5/5 Tone and bulk:normal tone throughout; no atrophy noted Sensory: Pinprick and light touch intact throughout, bilaterally Deep Tendon Reflexes: 2+ and symmetric throughout Plantars: Right: downgoing   Left: downgoing Cerebellar: normal finger-to-nose, normal rapid alternating movements and normal heel-to-shin test Gait: did not walk the patient      Lab Results: Basic Metabolic Panel: Recent Labs  Lab 03/03/18 1525 03/03/18 1531  NA 139 137  K 3.6 4.7  CL 104 104  CO2 26  --   GLUCOSE 99 97  BUN 11 16  CREATININE 1.06 1.10  CALCIUM 9.3  --     CBC: Recent Labs  Lab 03/03/18 1525 03/03/18 1531  WBC 7.5  --   NEUTROABS 5.7  --   HGB 16.9 17.3*  HCT 51.8 51.0  MCV 95.2  --   PLT 196  --     Coagulation Studies: Recent Labs    03/03/18 1525  LABPROT 13.5  INR 1.04    Imaging: Ct Head Wo Contrast  Result Date: 03/03/2018 CLINICAL DATA:   Left-sided headache. Ataxia. Weakness in the lower extremities bilaterally, left greater than right. No reported injury. EXAM: CT HEAD WITHOUT CONTRAST TECHNIQUE: Contiguous axial images were obtained from the base of the skull through the vertex without intravenous contrast. COMPARISON:  08/30/2014 head CT. FINDINGS: Brain: Small hypodense focus in the anterior right basal ganglia region without mass-effect. No evidence of parenchymal hemorrhage or extra-axial fluid collection. No mass lesion, mass effect, or midline shift. No CT evidence of acute infarction. Cerebral volume is age appropriate. No ventriculomegaly. Vascular: No acute abnormality. Skull: No evidence of calvarial fracture. Sinuses/Orbits: The visualized paranasal sinuses are essentially clear. Other:  The mastoid air cells are unopacified. IMPRESSION: Small hypodense focus in the anterior right basal ganglia region without mass-effect, cannot exclude a small infarct of indeterminate chronicity, possibly acute/subacute. No acute intracranial hemorrhage. Brain MRI correlation may be  obtained as clinically warranted. Electronically Signed   By: Ilona Sorrel M.D.   On: 03/03/2018 17:18   Mr Jodene Nam Head Wo Contrast  Result Date: 03/04/2018 CLINICAL DATA:  62 y/o M; left-sided headache. Ataxia. Weakness in lower extremities, left-greater-than-right. EXAM: MRI HEAD WITHOUT CONTRAST MRA HEAD WITHOUT CONTRAST MRA NECK WITHOUT AND WITH CONTRAST TECHNIQUE: Multiplanar, multiecho pulse sequences of the brain and surrounding structures were obtained without intravenous contrast. Angiographic images of the Circle of Willis were obtained using MRA technique without intravenous contrast. Angiographic images of the neck were obtained using MRA technique without and with intravenous contrast. Carotid stenosis measurements (when applicable) are obtained utilizing NASCET criteria, using the distal internal carotid diameter as the denominator. CONTRAST:  7 cc Gadavist  COMPARISON:  09/16/2005 MRI of the head. 03/03/2018 CT head. 08/30/2014 CT angiogram head and neck. FINDINGS: MRI HEAD FINDINGS Brain: 12 mm hyperintensity on the diffusion weighted sequence with intermediate diffusion on ADC compatible with subacute infarction involving the right anterior caudate head and anterior limb of internal capsule (series 3, image 28). No associated hemorrhage or mass effect. Few nonspecific T2 FLAIR hyperintensities in subcortical and periventricular white matter are compatible with mild chronic microvascular ischemic changes for age. Mild volume loss of the brain. No focal mass effect, extra-axial collection, hydrocephalus, or herniation. There are few scattered foci of susceptibility hypointensity within the right temporal lobe and left inferior cerebellum compatible with hemosiderin deposition of chronic microhemorrhage. No associated mass lesion to suggest cavernoma. The distribution is nonspecific. Vascular: As below. Skull and upper cervical spine: Normal marrow signal. Sinuses/Orbits: Negative. Other: None. MRA HEAD FINDINGS Internal carotid arteries:  Patent. Anterior cerebral arteries:  Patent. Middle cerebral arteries: Patent. Anterior communicating artery: Not identified, likely hypoplastic or absent. Posterior communicating arteries: Not identified, likely hypoplastic or absent. Posterior cerebral arteries:  Patent. Basilar artery:  Patent. Vertebral arteries:  Patent. No evidence of high-grade stenosis, large vessel occlusion, or aneurysm. MRA NECK FINDINGS Aortic arch: Patent.  Four vessel arch. Right common carotid artery: Patent. Right internal carotid artery: Moderate 60% stenosis of the proximal right ICA (series 1401, image 93). Right vertebral artery: Patent. Mild less 50% stenosis of the vertebral artery origin. Left common carotid artery: Patent. Left Internal carotid artery: Moderate 60% stenosis of the proximal left ICA (series 1401, image 84). Left Vertebral  artery: Patent. Mild-to-moderate stenosis of the left vertebral artery origin, assessment degraded by motion artifact at level of aortic arch. No findings of dissection or aneurysm. Biapical pleuroparenchymal scarring of the lungs. IMPRESSION: MRI head: 1. 12 mm subacute infarction within the right caudate head and anterior limb of internal capsule. No associated hemorrhage or mass effect. 2. Mild for age chronic microvascular ischemic changes and volume loss of the brain. MRA head: Patent anterior and posterior intracranial circulation. No large vessel occlusion, aneurysm, or significant stenosis is identified. MRA neck: 1. Right proximal ICA moderate 60% stenosis. 2. Left proximal ICA moderate 60% stenosis. 3. Approximately mild stenosis of vertebral artery origins. 4. No findings of dissection or aneurysm of the carotid and vertebral arteries. Electronically Signed   By: Kristine Garbe M.D.   On: 03/04/2018 00:22   Mr Angiogram Neck W Or Wo Contrast  Result Date: 03/04/2018 CLINICAL DATA:  62 y/o M; left-sided headache. Ataxia. Weakness in lower extremities, left-greater-than-right. EXAM: MRI HEAD WITHOUT CONTRAST MRA HEAD WITHOUT CONTRAST MRA NECK WITHOUT AND WITH CONTRAST TECHNIQUE: Multiplanar, multiecho pulse sequences of the brain and surrounding structures were obtained without intravenous contrast. Angiographic  images of the Circle of Willis were obtained using MRA technique without intravenous contrast. Angiographic images of the neck were obtained using MRA technique without and with intravenous contrast. Carotid stenosis measurements (when applicable) are obtained utilizing NASCET criteria, using the distal internal carotid diameter as the denominator. CONTRAST:  7 cc Gadavist COMPARISON:  09/16/2005 MRI of the head. 03/03/2018 CT head. 08/30/2014 CT angiogram head and neck. FINDINGS: MRI HEAD FINDINGS Brain: 12 mm hyperintensity on the diffusion weighted sequence with intermediate  diffusion on ADC compatible with subacute infarction involving the right anterior caudate head and anterior limb of internal capsule (series 3, image 28). No associated hemorrhage or mass effect. Few nonspecific T2 FLAIR hyperintensities in subcortical and periventricular white matter are compatible with mild chronic microvascular ischemic changes for age. Mild volume loss of the brain. No focal mass effect, extra-axial collection, hydrocephalus, or herniation. There are few scattered foci of susceptibility hypointensity within the right temporal lobe and left inferior cerebellum compatible with hemosiderin deposition of chronic microhemorrhage. No associated mass lesion to suggest cavernoma. The distribution is nonspecific. Vascular: As below. Skull and upper cervical spine: Normal marrow signal. Sinuses/Orbits: Negative. Other: None. MRA HEAD FINDINGS Internal carotid arteries:  Patent. Anterior cerebral arteries:  Patent. Middle cerebral arteries: Patent. Anterior communicating artery: Not identified, likely hypoplastic or absent. Posterior communicating arteries: Not identified, likely hypoplastic or absent. Posterior cerebral arteries:  Patent. Basilar artery:  Patent. Vertebral arteries:  Patent. No evidence of high-grade stenosis, large vessel occlusion, or aneurysm. MRA NECK FINDINGS Aortic arch: Patent.  Four vessel arch. Right common carotid artery: Patent. Right internal carotid artery: Moderate 60% stenosis of the proximal right ICA (series 1401, image 93). Right vertebral artery: Patent. Mild less 50% stenosis of the vertebral artery origin. Left common carotid artery: Patent. Left Internal carotid artery: Moderate 60% stenosis of the proximal left ICA (series 1401, image 84). Left Vertebral artery: Patent. Mild-to-moderate stenosis of the left vertebral artery origin, assessment degraded by motion artifact at level of aortic arch. No findings of dissection or aneurysm. Biapical pleuroparenchymal  scarring of the lungs. IMPRESSION: MRI head: 1. 12 mm subacute infarction within the right caudate head and anterior limb of internal capsule. No associated hemorrhage or mass effect. 2. Mild for age chronic microvascular ischemic changes and volume loss of the brain. MRA head: Patent anterior and posterior intracranial circulation. No large vessel occlusion, aneurysm, or significant stenosis is identified. MRA neck: 1. Right proximal ICA moderate 60% stenosis. 2. Left proximal ICA moderate 60% stenosis. 3. Approximately mild stenosis of vertebral artery origins. 4. No findings of dissection or aneurysm of the carotid and vertebral arteries. Electronically Signed   By: Kristine Garbe M.D.   On: 03/04/2018 00:22   Mr Brain Wo Contrast  Result Date: 03/04/2018 CLINICAL DATA:  62 y/o M; left-sided headache. Ataxia. Weakness in lower extremities, left-greater-than-right. EXAM: MRI HEAD WITHOUT CONTRAST MRA HEAD WITHOUT CONTRAST MRA NECK WITHOUT AND WITH CONTRAST TECHNIQUE: Multiplanar, multiecho pulse sequences of the brain and surrounding structures were obtained without intravenous contrast. Angiographic images of the Circle of Willis were obtained using MRA technique without intravenous contrast. Angiographic images of the neck were obtained using MRA technique without and with intravenous contrast. Carotid stenosis measurements (when applicable) are obtained utilizing NASCET criteria, using the distal internal carotid diameter as the denominator. CONTRAST:  7 cc Gadavist COMPARISON:  09/16/2005 MRI of the head. 03/03/2018 CT head. 08/30/2014 CT angiogram head and neck. FINDINGS: MRI HEAD FINDINGS Brain: 12 mm hyperintensity on  the diffusion weighted sequence with intermediate diffusion on ADC compatible with subacute infarction involving the right anterior caudate head and anterior limb of internal capsule (series 3, image 28). No associated hemorrhage or mass effect. Few nonspecific T2 FLAIR  hyperintensities in subcortical and periventricular white matter are compatible with mild chronic microvascular ischemic changes for age. Mild volume loss of the brain. No focal mass effect, extra-axial collection, hydrocephalus, or herniation. There are few scattered foci of susceptibility hypointensity within the right temporal lobe and left inferior cerebellum compatible with hemosiderin deposition of chronic microhemorrhage. No associated mass lesion to suggest cavernoma. The distribution is nonspecific. Vascular: As below. Skull and upper cervical spine: Normal marrow signal. Sinuses/Orbits: Negative. Other: None. MRA HEAD FINDINGS Internal carotid arteries:  Patent. Anterior cerebral arteries:  Patent. Middle cerebral arteries: Patent. Anterior communicating artery: Not identified, likely hypoplastic or absent. Posterior communicating arteries: Not identified, likely hypoplastic or absent. Posterior cerebral arteries:  Patent. Basilar artery:  Patent. Vertebral arteries:  Patent. No evidence of high-grade stenosis, large vessel occlusion, or aneurysm. MRA NECK FINDINGS Aortic arch: Patent.  Four vessel arch. Right common carotid artery: Patent. Right internal carotid artery: Moderate 60% stenosis of the proximal right ICA (series 1401, image 93). Right vertebral artery: Patent. Mild less 50% stenosis of the vertebral artery origin. Left common carotid artery: Patent. Left Internal carotid artery: Moderate 60% stenosis of the proximal left ICA (series 1401, image 84). Left Vertebral artery: Patent. Mild-to-moderate stenosis of the left vertebral artery origin, assessment degraded by motion artifact at level of aortic arch. No findings of dissection or aneurysm. Biapical pleuroparenchymal scarring of the lungs. IMPRESSION: MRI head: 1. 12 mm subacute infarction within the right caudate head and anterior limb of internal capsule. No associated hemorrhage or mass effect. 2. Mild for age chronic microvascular  ischemic changes and volume loss of the brain. MRA head: Patent anterior and posterior intracranial circulation. No large vessel occlusion, aneurysm, or significant stenosis is identified. MRA neck: 1. Right proximal ICA moderate 60% stenosis. 2. Left proximal ICA moderate 60% stenosis. 3. Approximately mild stenosis of vertebral artery origins. 4. No findings of dissection or aneurysm of the carotid and vertebral arteries. Electronically Signed   By: Kristine Garbe M.D.   On: 03/04/2018 00:22     ASSESSMENT AND PLAN  62 year old male with past medical history of tongue cancer presents with gait imbalance.  MRI brain shows a 12 mm right caudate and internal capsule infarct  MRA shows bilateral carotid stenosis of 60%-asymptomatic as a stroke is subcortical.  Acute Ischemic Stroke   Risk factors: History of radiation, cancer Etiology: Likely small vessel disease    Recommend #Transthoracic Echo  # Start patient on aspirin 81 mg daily and Plavix 75 mg into 3 weeks ( after 300mg  load)  #Start or continue Atorvastatin 40 mg/other high intensity statin # BP goal: permissive HTN upto 220/120 mmHg ( 185/110 if patient has CHF, CKD) # HBAIC and Lipid profile # Telemetry monitoring # Frequent neuro checks #  stroke swallow screen   Bilateral Asymptomatic carotid stenosis - Inpatient Vascular surgery consult not needed - will need to be monitored on follow up      Greggory Safranek [facility] Pager Number [phone]  Please page stroke NP  Or  PA  Or MD from 8am -4 pm  as this patient from this time will be  followed by the stroke.   You can look them up on [email or web address]  Password [email or web address]

## 2018-03-04 NOTE — Discharge Instructions (Signed)
You were evaluated in the Emergency Department and after careful evaluation, we did not find any emergent condition requiring admission or further testing in the hospital. ° °Please return to the Emergency Department if you experience any worsening of your condition.  We encourage you to follow up with a primary care provider.  Thank you for allowing us to be a part of your care. °

## 2018-03-04 NOTE — ED Provider Notes (Signed)
Care assumed from Dr. Sedonia Small, patient presenting with headache and dizziness, pending MRI of Lawrence to rule out stroke.  MRI does show evidence of subacute stroke with location consistent with his symptoms.  Case is discussed with Dr. Blaine Hamper of [facility], who agrees to admit the patient.  Results for orders placed or performed during the hospital encounter of 03/03/18  Protime-INR  Result Value Ref Range   Prothrombin Time 13.5 11.4 - 15.2 seconds   INR 1.04   APTT  Result Value Ref Range   aPTT 36 24 - 36 seconds  CBC  Result Value Ref Range   WBC 7.5 4.0 - 10.5 K/uL   RBC 5.44 4.22 - 5.81 MIL/uL   Hemoglobin 16.9 13.0 - 17.0 g/dL   HCT 51.8 39.0 - 52.0 %   MCV 95.2 78.0 - 100.0 fL   MCH 31.1 26.0 - 34.0 pg   MCHC 32.6 30.0 - 36.0 g/dL   RDW 13.0 11.5 - 15.5 %   Platelets 196 150 - 400 K/uL  Differential  Result Value Ref Range   Neutrophils Relative % 76 %   Neutro Abs 5.7 1.7 - 7.7 K/uL   Lymphocytes Relative 18 %   Lymphs Abs 1.4 0.7 - 4.0 K/uL   Monocytes Relative 4 %   Monocytes Absolute 0.3 0.1 - 1.0 K/uL   Eosinophils Relative 1 %   Eosinophils Absolute 0.1 0.0 - 0.7 K/uL   Basophils Relative 1 %   Basophils Absolute 0.0 0.0 - 0.1 K/uL   Immature Granulocytes 0 %   Abs Immature Granulocytes 0.0 0.0 - 0.1 K/uL  Comprehensive metabolic panel  Result Value Ref Range   Sodium 139 135 - 145 mmol/L   Potassium 3.6 3.5 - 5.1 mmol/L   Chloride 104 98 - 111 mmol/L   CO2 26 22 - 32 mmol/L   Glucose, Bld 99 70 - 99 mg/dL   BUN 11 8 - 23 mg/dL   Creatinine, Ser 1.06 0.61 - 1.24 mg/dL   Calcium 9.3 8.9 - 10.3 mg/dL   Total Protein 6.6 6.5 - 8.1 g/dL   Albumin 4.2 3.5 - 5.0 g/dL   AST 25 15 - 41 U/L   ALT 20 0 - 44 U/L   Alkaline Phosphatase 54 38 - 126 U/L   Total Bilirubin 0.7 0.3 - 1.2 mg/dL   GFR calc non Af Amer >60 >60 mL/min   GFR calc Af Amer >60 >60 mL/min   Anion gap 9 5 - 15  I-stat troponin, ED  Result Value Ref Range   Troponin i, poc 0.00 0.00 - 0.08  ng/mL   Comment 3          I-Stat Chem 8, ED  Result Value Ref Range   Sodium 137 135 - 145 mmol/L   Potassium 4.7 3.5 - 5.1 mmol/L   Chloride 104 98 - 111 mmol/L   BUN 16 8 - 23 mg/dL   Creatinine, Ser 1.10 0.61 - 1.24 mg/dL   Glucose, Bld 97 70 - 99 mg/dL   Calcium, Ion 1.04 (L) 1.15 - 1.40 mmol/L   TCO2 29 22 - 32 mmol/L   Hemoglobin 17.3 (H) 13.0 - 17.0 g/dL   HCT 51.0 39.0 - 52.0 %   Ct Head Wo Owen  Result Date: 03/03/2018 CLINICAL DATA:  Left-sided headache. Ataxia. Weakness in the lower extremities bilaterally, left greater than right. No reported injury. EXAM: CT HEAD WITHOUT Owen TECHNIQUE: Contiguous axial images were obtained from the base of  the skull through the vertex without intravenous Owen. COMPARISON:  08/30/2014 head CT. FINDINGS: Lawrence: Small hypodense focus in the anterior right basal ganglia region without mass-effect. No evidence of parenchymal hemorrhage or extra-axial fluid collection. No mass lesion, mass effect, or midline shift. No CT evidence of acute infarction. Cerebral volume is age appropriate. No ventriculomegaly. Vascular: No acute abnormality. Skull: No evidence of calvarial fracture. Sinuses/Orbits: The visualized paranasal sinuses are essentially clear. Other:  The mastoid air cells are unopacified. IMPRESSION: Small hypodense focus in the anterior right basal ganglia region without mass-effect, cannot exclude a small infarct of indeterminate chronicity, possibly acute/subacute. No acute intracranial hemorrhage. Lawrence MRI correlation may be obtained as clinically warranted. Electronically Signed   By: Ilona Sorrel M.D.   On: 03/03/2018 17:18   Mr [place] Owen Head Wo Owen  Result Date: 03/04/2018 CLINICAL DATA:  62 y/o M; left-sided headache. Ataxia. Weakness in lower extremities, left-greater-than-right. EXAM: MRI HEAD WITHOUT Owen MRA HEAD WITHOUT Owen MRA NECK WITHOUT AND WITH Owen TECHNIQUE: Multiplanar, multiecho pulse sequences of  the Lawrence and surrounding structures were obtained without intravenous Owen. Angiographic images of the Circle of Willis were obtained using MRA technique without intravenous Owen. Angiographic images of the neck were obtained using MRA technique without and with intravenous Owen. Carotid stenosis measurements (when applicable) are obtained utilizing NASCET criteria, using the distal internal carotid diameter as the denominator. Owen:  7 cc Gadavist COMPARISON:  09/16/2005 MRI of the head. 03/03/2018 CT head. 08/30/2014 CT Lawrence head and neck. FINDINGS: MRI HEAD FINDINGS Lawrence: 12 mm hyperintensity on the diffusion weighted sequence with intermediate diffusion on ADC compatible with subacute infarction involving the right anterior caudate head and anterior limb of internal capsule (series 3, image 28). No associated hemorrhage or mass effect. Few nonspecific T2 FLAIR hyperintensities in subcortical and periventricular white matter are compatible with mild chronic microvascular ischemic changes for age. Mild volume loss of the Lawrence. No focal mass effect, extra-axial collection, hydrocephalus, or herniation. There are few scattered foci of susceptibility hypointensity within the right temporal lobe and left inferior cerebellum compatible with hemosiderin deposition of chronic microhemorrhage. No associated mass lesion to suggest cavernoma. The distribution is nonspecific. Vascular: As below. Skull and upper cervical spine: Normal marrow signal. Sinuses/Orbits: Negative. Other: None. MRA HEAD FINDINGS Internal carotid arteries:  Patent. Anterior cerebral arteries:  Patent. Middle cerebral arteries: Patent. Anterior communicating artery: Not identified, likely hypoplastic or absent. Posterior communicating arteries: Not identified, likely hypoplastic or absent. Posterior cerebral arteries:  Patent. Basilar artery:  Patent. Vertebral arteries:  Patent. No evidence of high-grade stenosis, large vessel  occlusion, or aneurysm. MRA NECK FINDINGS Aortic arch: Patent.  Four vessel arch. Right common carotid artery: Patent. Right internal carotid artery: Moderate 60% stenosis of the proximal right ICA (series 1401, image 93). Right vertebral artery: Patent. Mild less 50% stenosis of the vertebral artery origin. Left common carotid artery: Patent. Left Internal carotid artery: Moderate 60% stenosis of the proximal left ICA (series 1401, image 84). Left Vertebral artery: Patent. Mild-to-moderate stenosis of the left vertebral artery origin, assessment degraded by motion artifact at level of aortic arch. No findings of dissection or aneurysm. Biapical pleuroparenchymal scarring of the lungs. IMPRESSION: MRI head: 1. 12 mm subacute infarction within the right caudate head and anterior limb of internal capsule. No associated hemorrhage or mass effect. 2. Mild for age chronic microvascular ischemic changes and volume loss of the Lawrence. MRA head: Patent anterior and posterior intracranial circulation. No large vessel occlusion, aneurysm,  or significant stenosis is identified. MRA neck: 1. Right proximal ICA moderate 60% stenosis. 2. Left proximal ICA moderate 60% stenosis. 3. Approximately mild stenosis of vertebral artery origins. 4. No findings of dissection or aneurysm of the carotid and vertebral arteries. Electronically Signed   By: Kristine Garbe M.D.   On: 03/04/2018 00:22   Mr [place] Owen  Result Date: 03/04/2018 CLINICAL DATA:  62 y/o M; left-sided headache. Ataxia. Weakness in lower extremities, left-greater-than-right. EXAM: MRI HEAD WITHOUT Owen MRA HEAD WITHOUT Owen MRA NECK WITHOUT AND WITH Owen TECHNIQUE: Multiplanar, multiecho pulse sequences of the Lawrence and surrounding structures were obtained without intravenous Owen. Angiographic images of the Circle of Willis were obtained using MRA technique without intravenous Owen. Angiographic images of the neck  were obtained using MRA technique without and with intravenous Owen. Carotid stenosis measurements (when applicable) are obtained utilizing NASCET criteria, using the distal internal carotid diameter as the denominator. Owen:  7 cc Gadavist COMPARISON:  09/16/2005 MRI of the head. 03/03/2018 CT head. 08/30/2014 CT Lawrence head and neck. FINDINGS: MRI HEAD FINDINGS Lawrence: 12 mm hyperintensity on the diffusion weighted sequence with intermediate diffusion on ADC compatible with subacute infarction involving the right anterior caudate head and anterior limb of internal capsule (series 3, image 28). No associated hemorrhage or mass effect. Few nonspecific T2 FLAIR hyperintensities in subcortical and periventricular white matter are compatible with mild chronic microvascular ischemic changes for age. Mild volume loss of the Lawrence. No focal mass effect, extra-axial collection, hydrocephalus, or herniation. There are few scattered foci of susceptibility hypointensity within the right temporal lobe and left inferior cerebellum compatible with hemosiderin deposition of chronic microhemorrhage. No associated mass lesion to suggest cavernoma. The distribution is nonspecific. Vascular: As below. Skull and upper cervical spine: Normal marrow signal. Sinuses/Orbits: Negative. Other: None. MRA HEAD FINDINGS Internal carotid arteries:  Patent. Anterior cerebral arteries:  Patent. Middle cerebral arteries: Patent. Anterior communicating artery: Not identified, likely hypoplastic or absent. Posterior communicating arteries: Not identified, likely hypoplastic or absent. Posterior cerebral arteries:  Patent. Basilar artery:  Patent. Vertebral arteries:  Patent. No evidence of high-grade stenosis, large vessel occlusion, or aneurysm. MRA NECK FINDINGS Aortic arch: Patent.  Four vessel arch. Right common carotid artery: Patent. Right internal carotid artery: Moderate 60% stenosis of the proximal right ICA (series 1401, image  93). Right vertebral artery: Patent. Mild less 50% stenosis of the vertebral artery origin. Left common carotid artery: Patent. Left Internal carotid artery: Moderate 60% stenosis of the proximal left ICA (series 1401, image 84). Left Vertebral artery: Patent. Mild-to-moderate stenosis of the left vertebral artery origin, assessment degraded by motion artifact at level of aortic arch. No findings of dissection or aneurysm. Biapical pleuroparenchymal scarring of the lungs. IMPRESSION: MRI head: 1. 12 mm subacute infarction within the right caudate head and anterior limb of internal capsule. No associated hemorrhage or mass effect. 2. Mild for age chronic microvascular ischemic changes and volume loss of the Lawrence. MRA head: Patent anterior and posterior intracranial circulation. No large vessel occlusion, aneurysm, or significant stenosis is identified. MRA neck: 1. Right proximal ICA moderate 60% stenosis. 2. Left proximal ICA moderate 60% stenosis. 3. Approximately mild stenosis of vertebral artery origins. 4. No findings of dissection or aneurysm of the carotid and vertebral arteries. Electronically Signed   By: Kristine Garbe M.D.   On: 03/04/2018 00:22   Mr [place] Owen  Result Date: 03/04/2018 CLINICAL DATA:  62 y/o M; left-sided headache. Ataxia.  Weakness in lower extremities, left-greater-than-right. EXAM: MRI HEAD WITHOUT Owen MRA HEAD WITHOUT Owen MRA NECK WITHOUT AND WITH Owen TECHNIQUE: Multiplanar, multiecho pulse sequences of the Lawrence and surrounding structures were obtained without intravenous Owen. Angiographic images of the Circle of Willis were obtained using MRA technique without intravenous Owen. Angiographic images of the neck were obtained using MRA technique without and with intravenous Owen. Carotid stenosis measurements (when applicable) are obtained utilizing NASCET criteria, using the distal internal carotid diameter as the denominator. Owen:   7 cc Gadavist COMPARISON:  09/16/2005 MRI of the head. 03/03/2018 CT head. 08/30/2014 CT Lawrence head and neck. FINDINGS: MRI HEAD FINDINGS Lawrence: 12 mm hyperintensity on the diffusion weighted sequence with intermediate diffusion on ADC compatible with subacute infarction involving the right anterior caudate head and anterior limb of internal capsule (series 3, image 28). No associated hemorrhage or mass effect. Few nonspecific T2 FLAIR hyperintensities in subcortical and periventricular white matter are compatible with mild chronic microvascular ischemic changes for age. Mild volume loss of the Lawrence. No focal mass effect, extra-axial collection, hydrocephalus, or herniation. There are few scattered foci of susceptibility hypointensity within the right temporal lobe and left inferior cerebellum compatible with hemosiderin deposition of chronic microhemorrhage. No associated mass lesion to suggest cavernoma. The distribution is nonspecific. Vascular: As below. Skull and upper cervical spine: Normal marrow signal. Sinuses/Orbits: Negative. Other: None. MRA HEAD FINDINGS Internal carotid arteries:  Patent. Anterior cerebral arteries:  Patent. Middle cerebral arteries: Patent. Anterior communicating artery: Not identified, likely hypoplastic or absent. Posterior communicating arteries: Not identified, likely hypoplastic or absent. Posterior cerebral arteries:  Patent. Basilar artery:  Patent. Vertebral arteries:  Patent. No evidence of high-grade stenosis, large vessel occlusion, or aneurysm. MRA NECK FINDINGS Aortic arch: Patent.  Four vessel arch. Right common carotid artery: Patent. Right internal carotid artery: Moderate 60% stenosis of the proximal right ICA (series 1401, image 93). Right vertebral artery: Patent. Mild less 50% stenosis of the vertebral artery origin. Left common carotid artery: Patent. Left Internal carotid artery: Moderate 60% stenosis of the proximal left ICA (series 1401, image 84). Left  Vertebral artery: Patent. Mild-to-moderate stenosis of the left vertebral artery origin, assessment degraded by motion artifact at level of aortic arch. No findings of dissection or aneurysm. Biapical pleuroparenchymal scarring of the lungs. IMPRESSION: MRI head: 1. 12 mm subacute infarction within the right caudate head and anterior limb of internal capsule. No associated hemorrhage or mass effect. 2. Mild for age chronic microvascular ischemic changes and volume loss of the Lawrence. MRA head: Patent anterior and posterior intracranial circulation. No large vessel occlusion, aneurysm, or significant stenosis is identified. MRA neck: 1. Right proximal ICA moderate 60% stenosis. 2. Left proximal ICA moderate 60% stenosis. 3. Approximately mild stenosis of vertebral artery origins. 4. No findings of dissection or aneurysm of the carotid and vertebral arteries. Electronically Signed   By: Kristine Garbe M.D.   On: 83/25/4982 64:15      Delora Fuel, MD 83/09/40 [phone]

## 2018-03-04 NOTE — Evaluation (Signed)
Physical Therapy Evaluation Patient Details Name: Lawrence Owen MRN: [id number] DOB: [birth year] Today's Date: 03/04/2018   History of Present Illness  62 year old male with past medical history of tongue cancer presents with gait imbalance.  MRI brain shows a 12 mm right caudate and internal capsule infarct  MRA shows bilateral carotid stenosis of 60%-asymptomatic as a stroke is subcortical.  Clinical Impression  Patient seen for therapy assessment.  Mobilizing well with no noted focal deficits at this time. Educated patient on BEFAST signs. No further acute PT needs. Will sign off.     Follow Up Recommendations No PT follow up    Equipment Recommendations  None recommended by PT    Recommendations for Other Services       Precautions / Restrictions        Mobility  Bed Mobility Overal bed mobility: Independent                Transfers Overall transfer level: Independent                  Ambulation/Gait Ambulation/Gait assistance: Independent Gait Distance (Feet): 380 Feet Assistive device: None Gait Pattern/deviations: WFL(Within Functional Limits)   Gait velocity interpretation: 1.31 - 2.62 ft/sec, indicative of limited community ambulator General Gait Details: steady with ambulation and higher level tasks  Stairs Stairs: Yes Stairs assistance: Modified independent (Device/Increase time) Stair Management: One rail Right Number of Stairs: 5 General stair comments: no difficulty  Wheelchair Mobility    Modified Rankin (Stroke Patients Only) Modified Rankin (Stroke Patients Only) Pre-Morbid Rankin Score: No symptoms Modified Rankin: No symptoms     Balance Overall balance assessment: Independent                               Standardized Balance Assessment Standardized Balance Assessment : Dynamic Gait Index   Dynamic Gait Index Level Surface: Normal Change in Gait Speed: Normal Gait with Horizontal Head Turns:  Normal Gait with Vertical Head Turns: Normal Gait and Pivot Turn: Normal Step Over Obstacle: Mild Impairment Step Around Obstacles: Normal Steps: Mild Impairment Total Score: 22       Pertinent Vitals/Pain Pain Assessment: No/denies pain    Home Living Family/patient expects to be discharged to:: Private residence Living Arrangements: Spouse/significant other;Children Available Help at Discharge: Family Type of Home: House Home Access: Stairs to enter Entrance Stairs-Rails: None Technical brewer of Steps: 3 Home Layout: One level        Prior Function Level of Independence: Independent               Hand Dominance   Dominant Hand: Right    Extremity/Trunk Assessment   Upper Extremity Assessment Upper Extremity Assessment: Overall WFL for tasks assessed    Lower Extremity Assessment Lower Extremity Assessment: Overall WFL for tasks assessed       Communication   Communication: No difficulties  Cognition Arousal/Alertness: Awake/alert Behavior During Therapy: WFL for tasks assessed/performed Overall Cognitive Status: Within Functional Limits for tasks assessed                                        General Comments      Exercises     Assessment/Plan    PT Assessment Patent does not need any further PT services  PT Problem List         PT  Treatment Interventions      PT Goals (Current goals can be found in the Care Plan section)  Acute Rehab PT Goals PT Goal Formulation: All assessment and education complete, DC therapy    Frequency     Barriers to discharge        Co-evaluation               AM-PAC PT "6 Clicks" Daily Activity  Outcome Measure Difficulty turning over in bed (including adjusting bedclothes, sheets and blankets)?: None Difficulty moving from lying on back to sitting on the side of the bed? : None Difficulty sitting down on and standing up from a chair with arms (e.g., wheelchair, bedside  commode, etc,.)?: None Help needed moving to and from a bed to chair (including a wheelchair)?: None Help needed walking in hospital room?: None Help needed climbing 3-5 steps with a railing? : A Little 6 Click Score: 23    End of Session Equipment Utilized During Treatment: Gait belt Activity Tolerance: Patient tolerated treatment well Patient left: in bed(sitting EOB) Nurse Communication: Need for lift equipment PT Visit Diagnosis: Other symptoms and signs involving the nervous system (R29.898)    Time: 1308-6578 PT Time Calculation (min) (ACUTE ONLY): 18 min   Charges:   PT Evaluation $PT Eval Low Complexity: [place], PT DPT  Board Certified Neurologic Specialist Acute Rehabilitation Services Pager [phone] Office [place] 03/04/2018, 3:42 PM

## 2018-03-04 NOTE — Discharge Summary (Signed)
Physician Discharge Summary  Lawrence Owen WIO:[id number] DOB: 1955/07/23 DOA: 03/03/2018  PCP: Marda Stalker, PA-C  Admit date: 03/03/2018 Discharge date: 03/04/2018  Admitted From: Home   Disposition: Home Recommendations for Outpatient Follow-up:  1. Follow up with PCP this week with labs CBC and CMET 2. Follow with Neurology as Outpatient  3. Follow with [facility] regarding carotid artery disease 4. Follow with Cardiology regarding Hypertension, bradycardia and HLD   Home Health: No Equipment/Devices: None Discharge Condition: Stable  CODE STATUS: FULL    Diet recommendation:  Heart healthy Brief/Interim Summary:   62 y.o. male with medical history significant of hypertension, hyperlipidemia, hypothyroidism, tobacco abuse, alcohol use, carotid artery stenosis, tongue cancer 2002 (s/p of XRT and neck lymph node dissection, not following oncologist), who was in his usual state of health until Friday morning, at about 8:00 when he began feeling pressure in the left side of the head, and numbness in the left face.  He also has poor balance, leaning toward left side.  He states that he has bilateral leg numbness.  No numbness, weakness in the arms.  No vision change or hearing loss.  He has right facial droop, which is chronic issue.  Patient reports that may have had similar symptoms "a few months ago", but did not seek medical attention.  Patient denies chest pain, shortness breath, cough, fever or chills.  No nausea, vomiting, diarrhea, abdominal pain, symptoms of UTI.  Patient has not taken blood pressure medications or cholesterol medications for long time.  He states that he drinks 3 beers each day, almost every day.  WBC 7.5, INR 1.04, creatinine 1.10, elevated blood pressure 215/129, bradycardia, no tachypnea, oxygen satting 99% on room air, no fever. CT-head showed small hypodense focus in the anterior right basal ganglia region. MRI confirmed 12 mm subacute infarction within the right  caudate head and anterior limb of internal capsule. MRA neck with Bilateral 60 % moderate ICA stenosis. No dissection or aneurysm.  Hb A1C 5.4, LDL 134 . Dr. Lorraine Lax of neurology was consulted.TTE was performed, with EF 38-45 %, normal systolic. No cardiac source of emboli identified. THere is significant atheromatous plaque with mildly dilated ascending aorta. He was on permissive HTN, may resume his meds in am. Case discussed with Dr. Evangeline Gula, Attending MD, agreeing with plan   Discharge Diagnoses:  Principal Problem:   Stroke (cerebrum) [facility]) Active Problems:   Hypothyroidism   HYPERCHOLESTEROLEMIA   Alcohol use   Essential hypertension   Bilateral carotid artery disease ([place])  Acute ischemic stroke, and patient with a history of radiation, cancer, and small vessel disease.  MRI of the brain confirms 12 mm subacute infarction within the right caudate head and anterior limb of the internal capsule.  MRA of the neck shows bilateral 60% moderate ICA stenosis without dissection or aneurysm.  Transthoracic echo shows EF 50 to 60% with normal systolic, there is no cardiac source of emboli identified.  Neurology evaluated the patient, and recommended permissive hypertension over the next 24 hours.  He is also receiving aspirin and Plavix.  No TPA was given because he is outside of the window.  NIH SS is 0.  Baseline MRSA is 0 Discharge to home in stable condition, follow-up with neurology, and with cardiology. Follow-up with vascular surgery as an outpatient for his bilateral asymptomatic carotid stenosis. Aspirin 81 mg daily Plavix 75 mg for 3 weeks (he received 300 mg load at the hospital.)  Lipitor 80 mg daily  Hypertension, the patient is  on permissive hypertension, for blood pressure up to 220/120 May resume blood pressure medications within the next 24 hours  Hypothyroidism Continue home Synthroid  Hyperlipidemia, LDL 134 Lipitor 80 mg daily  Tobacco abuse and alcohol abuse.  The  patient was placed on CIWA here, as well as nicotine patch Counseling provided. Patient prefers to buy over-the-counter nicotine once discharged from the hospital.  History of tongue cancer 2002 (s/p of XRT and neck lymph node dissection, not following oncologist) No active issues are present at the time Follow with PCP, and oncology as needed  Discharge Instructions  Discharge Instructions    Call MD for:  difficulty breathing, headache or visual disturbances   Complete by:  As directed    Call MD for:  extreme fatigue   Complete by:  As directed    Call MD for:  hives   Complete by:  As directed    Call MD for:  persistant dizziness or light-headedness   Complete by:  As directed    Call MD for:  persistant nausea and vomiting   Complete by:  As directed    Call MD for:  redness, tenderness, or signs of infection (pain, swelling, redness, odor or green/yellow discharge around incision site)   Complete by:  As directed    Call MD for:  severe uncontrolled pain   Complete by:  As directed    Call MD for:  temperature >100.4   Complete by:  As directed    Diet - low sodium heart healthy   Complete by:  As directed    Discharge instructions   Complete by:  As directed    Follow up with Primary doctor this week with labs CBC and CMET Follow with Neurology as Outpatient  Follow with [facility] regarding carotid artery disease Follow with Cardiology regarding Hypertension,slow heart rate  And hyperlipidemia   Increase activity slowly   Complete by:  As directed    No wound care   Complete by:  As directed    Walk with assistance   Complete by:  As directed    If you feel weak get assistance to walk     Allergies as of 03/04/2018   No Known Allergies     Medication List    STOP taking these medications   ibuprofen 200 MG tablet Commonly known as:  ADVIL,MOTRIN   UNABLE TO FIND     TAKE these medications   acetaminophen 500 MG tablet Commonly known as:  TYLENOL Take 500  mg by mouth every 6 (six) hours as needed for mild pain or fever.   aspirin 81 MG chewable tablet Chew 1 tablet (81 mg total) by mouth daily. Start taking on:  03/05/2018   atorvastatin 80 MG tablet Commonly known as:  LIPITOR Take 1 tablet (80 mg total) by mouth daily at 6 PM.   clopidogrel 75 MG tablet Commonly known as:  PLAVIX Take 1 tablet (75 mg total) by mouth daily. Start taking on:  03/05/2018   EMERGEN-C IMMUNE PO Take 1 Package by mouth daily.   fluticasone 50 MCG/ACT nasal spray Commonly known as:  FLONASE Place 2 sprays into both nostrils daily.   folic acid 1 MG tablet Commonly known as:  FOLVITE Take 1 tablet (1 mg total) by mouth daily. Start taking on:  03/05/2018   levothyroxine 100 MCG tablet Commonly known as:  SYNTHROID, LEVOTHROID Take 100 mcg by mouth daily before breakfast.   thiamine 100 MG tablet Take 1 tablet (100  mg total) by mouth daily. Start taking on:  03/05/2018   VITAMIN B-12 PO Take 1 tablet by mouth daily.      Follow-up Information    Marda Stalker, PA-C. Schedule an appointment as soon as possible for a visit in 2 day(s).   Specialty:  Family Medicine Why:  for post hospital f/u with CBC and CMET Refer to Cardiology for post hospital visit and bradycardia, Hypertension Follow up on appt with [facility] regarding dx stroke Contact information: [place] [address] [phone]        [place]. Schedule an appointment as soon as possible for a visit in 3 day(s).   Why:  new patient, follow up after hospital visit  Contact information: [place], [place]. Schedule an appointment as soon as possible for a visit.   Why:  Bilateral Carotid Artery disease  Contact information: [address] [place] [phone]         No Known  Allergies  Consultations: Neurology, Dr. Lorraine Lax  Procedures/Studies: Ct Head Wo Contrast  Result Date: 03/03/2018 CLINICAL DATA:  Left-sided headache. Ataxia. Weakness in the lower extremities bilaterally, left greater than right. No reported injury. EXAM: CT HEAD WITHOUT CONTRAST TECHNIQUE: Contiguous axial images were obtained from the base of the skull through the vertex without intravenous contrast. COMPARISON:  08/30/2014 head CT. FINDINGS: Brain: Small hypodense focus in the anterior right basal ganglia region without mass-effect. No evidence of parenchymal hemorrhage or extra-axial fluid collection. No mass lesion, mass effect, or midline shift. No CT evidence of acute infarction. Cerebral volume is age appropriate. No ventriculomegaly. Vascular: No acute abnormality. Skull: No evidence of calvarial fracture. Sinuses/Orbits: The visualized paranasal sinuses are essentially clear. Other:  The mastoid air cells are unopacified. IMPRESSION: Small hypodense focus in the anterior right basal ganglia region without mass-effect, cannot exclude a small infarct of indeterminate chronicity, possibly acute/subacute. No acute intracranial hemorrhage. Brain MRI correlation may be obtained as clinically warranted. Electronically Signed   By: Ilona Sorrel M.D.   On: 03/03/2018 17:18   Mr Jodene Nam Head Wo Contrast  Result Date: 03/04/2018 CLINICAL DATA:  62 y/o M; left-sided headache. Ataxia. Weakness in lower extremities, left-greater-than-right. EXAM: MRI HEAD WITHOUT CONTRAST MRA HEAD WITHOUT CONTRAST MRA NECK WITHOUT AND WITH CONTRAST TECHNIQUE: Multiplanar, multiecho pulse sequences of the brain and surrounding structures were obtained without intravenous contrast. Angiographic images of the Circle of Willis were obtained using MRA technique without intravenous contrast. Angiographic images of the neck were obtained using MRA technique without and with intravenous contrast. Carotid stenosis measurements (when  applicable) are obtained utilizing NASCET criteria, using the distal internal carotid diameter as the denominator. CONTRAST:  7 cc Gadavist COMPARISON:  09/16/2005 MRI of the head. 03/03/2018 CT head. 08/30/2014 CT angiogram head and neck. FINDINGS: MRI HEAD FINDINGS Brain: 12 mm hyperintensity on the diffusion weighted sequence with intermediate diffusion on ADC compatible with subacute infarction involving the right anterior caudate head and anterior limb of internal capsule (series 3, image 28). No associated hemorrhage or mass effect. Few nonspecific T2 FLAIR hyperintensities in subcortical and periventricular white matter are compatible with mild chronic microvascular ischemic changes for age. Mild volume loss of the brain. No focal mass effect, extra-axial collection, hydrocephalus, or herniation. There are few scattered foci of susceptibility hypointensity within the right temporal lobe and left inferior cerebellum compatible with  hemosiderin deposition of chronic microhemorrhage. No associated mass lesion to suggest cavernoma. The distribution is nonspecific. Vascular: As below. Skull and upper cervical spine: Normal marrow signal. Sinuses/Orbits: Negative. Other: None. MRA HEAD FINDINGS Internal carotid arteries:  Patent. Anterior cerebral arteries:  Patent. Middle cerebral arteries: Patent. Anterior communicating artery: Not identified, likely hypoplastic or absent. Posterior communicating arteries: Not identified, likely hypoplastic or absent. Posterior cerebral arteries:  Patent. Basilar artery:  Patent. Vertebral arteries:  Patent. No evidence of high-grade stenosis, large vessel occlusion, or aneurysm. MRA NECK FINDINGS Aortic arch: Patent.  Four vessel arch. Right common carotid artery: Patent. Right internal carotid artery: Moderate 60% stenosis of the proximal right ICA (series 1401, image 93). Right vertebral artery: Patent. Mild less 50% stenosis of the vertebral artery origin. Left common carotid  artery: Patent. Left Internal carotid artery: Moderate 60% stenosis of the proximal left ICA (series 1401, image 84). Left Vertebral artery: Patent. Mild-to-moderate stenosis of the left vertebral artery origin, assessment degraded by motion artifact at level of aortic arch. No findings of dissection or aneurysm. Biapical pleuroparenchymal scarring of the lungs. IMPRESSION: MRI head: 1. 12 mm subacute infarction within the right caudate head and anterior limb of internal capsule. No associated hemorrhage or mass effect. 2. Mild for age chronic microvascular ischemic changes and volume loss of the brain. MRA head: Patent anterior and posterior intracranial circulation. No large vessel occlusion, aneurysm, or significant stenosis is identified. MRA neck: 1. Right proximal ICA moderate 60% stenosis. 2. Left proximal ICA moderate 60% stenosis. 3. Approximately mild stenosis of vertebral artery origins. 4. No findings of dissection or aneurysm of the carotid and vertebral arteries. Electronically Signed   By: Kristine Garbe M.D.   On: 03/04/2018 00:22   Mr Angiogram Neck W Or Wo Contrast  Result Date: 03/04/2018 CLINICAL DATA:  62 y/o M; left-sided headache. Ataxia. Weakness in lower extremities, left-greater-than-right. EXAM: MRI HEAD WITHOUT CONTRAST MRA HEAD WITHOUT CONTRAST MRA NECK WITHOUT AND WITH CONTRAST TECHNIQUE: Multiplanar, multiecho pulse sequences of the brain and surrounding structures were obtained without intravenous contrast. Angiographic images of the Circle of Willis were obtained using MRA technique without intravenous contrast. Angiographic images of the neck were obtained using MRA technique without and with intravenous contrast. Carotid stenosis measurements (when applicable) are obtained utilizing NASCET criteria, using the distal internal carotid diameter as the denominator. CONTRAST:  7 cc Gadavist COMPARISON:  09/16/2005 MRI of the head. 03/03/2018 CT head. 08/30/2014 CT  angiogram head and neck. FINDINGS: MRI HEAD FINDINGS Brain: 12 mm hyperintensity on the diffusion weighted sequence with intermediate diffusion on ADC compatible with subacute infarction involving the right anterior caudate head and anterior limb of internal capsule (series 3, image 28). No associated hemorrhage or mass effect. Few nonspecific T2 FLAIR hyperintensities in subcortical and periventricular white matter are compatible with mild chronic microvascular ischemic changes for age. Mild volume loss of the brain. No focal mass effect, extra-axial collection, hydrocephalus, or herniation. There are few scattered foci of susceptibility hypointensity within the right temporal lobe and left inferior cerebellum compatible with hemosiderin deposition of chronic microhemorrhage. No associated mass lesion to suggest cavernoma. The distribution is nonspecific. Vascular: As below. Skull and upper cervical spine: Normal marrow signal. Sinuses/Orbits: Negative. Other: None. MRA HEAD FINDINGS Internal carotid arteries:  Patent. Anterior cerebral arteries:  Patent. Middle cerebral arteries: Patent. Anterior communicating artery: Not identified, likely hypoplastic or absent. Posterior communicating arteries: Not identified, likely hypoplastic or absent. Posterior cerebral arteries:  Patent. Basilar artery:  Patent.  Vertebral arteries:  Patent. No evidence of high-grade stenosis, large vessel occlusion, or aneurysm. MRA NECK FINDINGS Aortic arch: Patent.  Four vessel arch. Right common carotid artery: Patent. Right internal carotid artery: Moderate 60% stenosis of the proximal right ICA (series 1401, image 93). Right vertebral artery: Patent. Mild less 50% stenosis of the vertebral artery origin. Left common carotid artery: Patent. Left Internal carotid artery: Moderate 60% stenosis of the proximal left ICA (series 1401, image 84). Left Vertebral artery: Patent. Mild-to-moderate stenosis of the left vertebral artery origin,  assessment degraded by motion artifact at level of aortic arch. No findings of dissection or aneurysm. Biapical pleuroparenchymal scarring of the lungs. IMPRESSION: MRI head: 1. 12 mm subacute infarction within the right caudate head and anterior limb of internal capsule. No associated hemorrhage or mass effect. 2. Mild for age chronic microvascular ischemic changes and volume loss of the brain. MRA head: Patent anterior and posterior intracranial circulation. No large vessel occlusion, aneurysm, or significant stenosis is identified. MRA neck: 1. Right proximal ICA moderate 60% stenosis. 2. Left proximal ICA moderate 60% stenosis. 3. Approximately mild stenosis of vertebral artery origins. 4. No findings of dissection or aneurysm of the carotid and vertebral arteries. Electronically Signed   By: Kristine Garbe M.D.   On: 03/04/2018 00:22   Mr Brain Wo Contrast  Result Date: 03/04/2018 CLINICAL DATA:  62 y/o M; left-sided headache. Ataxia. Weakness in lower extremities, left-greater-than-right. EXAM: MRI HEAD WITHOUT CONTRAST MRA HEAD WITHOUT CONTRAST MRA NECK WITHOUT AND WITH CONTRAST TECHNIQUE: Multiplanar, multiecho pulse sequences of the brain and surrounding structures were obtained without intravenous contrast. Angiographic images of the Circle of Willis were obtained using MRA technique without intravenous contrast. Angiographic images of the neck were obtained using MRA technique without and with intravenous contrast. Carotid stenosis measurements (when applicable) are obtained utilizing NASCET criteria, using the distal internal carotid diameter as the denominator. CONTRAST:  7 cc Gadavist COMPARISON:  09/16/2005 MRI of the head. 03/03/2018 CT head. 08/30/2014 CT angiogram head and neck. FINDINGS: MRI HEAD FINDINGS Brain: 12 mm hyperintensity on the diffusion weighted sequence with intermediate diffusion on ADC compatible with subacute infarction involving the right anterior caudate head and  anterior limb of internal capsule (series 3, image 28). No associated hemorrhage or mass effect. Few nonspecific T2 FLAIR hyperintensities in subcortical and periventricular white matter are compatible with mild chronic microvascular ischemic changes for age. Mild volume loss of the brain. No focal mass effect, extra-axial collection, hydrocephalus, or herniation. There are few scattered foci of susceptibility hypointensity within the right temporal lobe and left inferior cerebellum compatible with hemosiderin deposition of chronic microhemorrhage. No associated mass lesion to suggest cavernoma. The distribution is nonspecific. Vascular: As below. Skull and upper cervical spine: Normal marrow signal. Sinuses/Orbits: Negative. Other: None. MRA HEAD FINDINGS Internal carotid arteries:  Patent. Anterior cerebral arteries:  Patent. Middle cerebral arteries: Patent. Anterior communicating artery: Not identified, likely hypoplastic or absent. Posterior communicating arteries: Not identified, likely hypoplastic or absent. Posterior cerebral arteries:  Patent. Basilar artery:  Patent. Vertebral arteries:  Patent. No evidence of high-grade stenosis, large vessel occlusion, or aneurysm. MRA NECK FINDINGS Aortic arch: Patent.  Four vessel arch. Right common carotid artery: Patent. Right internal carotid artery: Moderate 60% stenosis of the proximal right ICA (series 1401, image 93). Right vertebral artery: Patent. Mild less 50% stenosis of the vertebral artery origin. Left common carotid artery: Patent. Left Internal carotid artery: Moderate 60% stenosis of the proximal left ICA (series 1401, image  39). Left Vertebral artery: Patent. Mild-to-moderate stenosis of the left vertebral artery origin, assessment degraded by motion artifact at level of aortic arch. No findings of dissection or aneurysm. Biapical pleuroparenchymal scarring of the lungs. IMPRESSION: MRI head: 1. 12 mm subacute infarction within the right caudate head  and anterior limb of internal capsule. No associated hemorrhage or mass effect. 2. Mild for age chronic microvascular ischemic changes and volume loss of the brain. MRA head: Patent anterior and posterior intracranial circulation. No large vessel occlusion, aneurysm, or significant stenosis is identified. MRA neck: 1. Right proximal ICA moderate 60% stenosis. 2. Left proximal ICA moderate 60% stenosis. 3. Approximately mild stenosis of vertebral artery origins. 4. No findings of dissection or aneurysm of the carotid and vertebral arteries. Electronically Signed   By: Kristine Garbe M.D.   On: 03/04/2018 00:22      Subjective: Overall status improved. Reports feeling better.  The patient denies any further facial Esher, weakness, numbness.  He denies any tingling.  Gait is normal.  He denies any headaches or vision changes.  He denies any confusion.  He denies any chest pain or palpitations.  He denies any shortness of breath or cough.  He denies any lower extremity swelling or calf pain.  Is eager to be discharged home.   Discharge Exam: Vitals:   03/04/18 0728 03/04/18 1235  BP: (!) 143/101 (!) 174/100  Pulse: 60 79  Resp: 16 15  Temp: 98.4 F (36.9 C) 98.2 F (36.8 C)  SpO2: 98% 100%   Vitals:   03/04/18 0420 03/04/18 0620 03/04/18 0728 03/04/18 1235  BP: (!) 196/109 (!) 146/70 (!) 143/101 (!) 174/100  Pulse: (!) 52 86 60 79  Resp: 18 16 16 15   Temp: 98.2 F (36.8 C) 97.7 F (36.5 C) 98.4 F (36.9 C) 98.2 F (36.8 C)  TempSrc: Oral Oral Oral   SpO2: 99% 99% 98% 100%  Weight:      Height:        General: Pt is alert, awake, not in acute distress Cardiovascular: RRR, S1/S2 +, no rubs, no gallops.  He has left carotid bruit. Respiratory: CTA bilaterally, no wheezing, no rhonchi Abdominal: Soft, NT, ND, bowel sounds + Extremities: no edema, no cyanosis     The results of significant diagnostics from this hospitalization (including imaging, microbiology, ancillary  and laboratory) are listed below for reference.     Microbiology: No results found for this or any previous visit (from the past 240 hour(s)).   Labs: BNP (last 3 results) No results for input(s): BNP in the last 8760 hours. Basic Metabolic Panel: Recent Labs  Lab 03/03/18 1525 03/03/18 1531  NA 139 137  K 3.6 4.7  CL 104 104  CO2 26  --   GLUCOSE 99 97  BUN 11 16  CREATININE 1.06 1.10  CALCIUM 9.3  --    Liver Function Tests: Recent Labs  Lab 03/03/18 1525  AST 25  ALT 20  ALKPHOS 54  BILITOT 0.7  PROT 6.6  ALBUMIN 4.2   No results for input(s): LIPASE, AMYLASE in the last 168 hours. No results for input(s): AMMONIA in the last 168 hours. CBC: Recent Labs  Lab 03/03/18 1525 03/03/18 1531  WBC 7.5  --   NEUTROABS 5.7  --   HGB 16.9 17.3*  HCT 51.8 51.0  MCV 95.2  --   PLT 196  --    Cardiac Enzymes: No results for input(s): CKTOTAL, CKMB, CKMBINDEX, TROPONINI in the last 168  hours. BNP: Invalid input(s): POCBNP CBG: No results for input(s): GLUCAP in the last 168 hours. D-Dimer No results for input(s): DDIMER in the last 72 hours. Hgb A1c Recent Labs    03/04/18 0153  HGBA1C 5.4   Lipid Profile Recent Labs    03/04/18 0153  CHOL 201*  HDL 52  LDLCALC 134*  TRIG 75  CHOLHDL 3.9   Thyroid function studies No results for input(s): TSH, T4TOTAL, T3FREE, THYROIDAB in the last 72 hours.  Invalid input(s): FREET3 Anemia work up No results for input(s): VITAMINB12, FOLATE, FERRITIN, TIBC, IRON, RETICCTPCT in the last 72 hours. Urinalysis    Component Value Date/Time   COLORURINE AMBER (A) 08/29/2014 1840   APPEARANCEUR CLEAR 08/29/2014 1840   LABSPEC 1.027 08/29/2014 1840   PHURINE 6.0 08/29/2014 1840   GLUCOSEU NEGATIVE 08/29/2014 1840   HGBUR NEGATIVE 08/29/2014 1840   BILIRUBINUR SMALL (A) 08/29/2014 1840   KETONESUR 15 (A) 08/29/2014 1840   PROTEINUR NEGATIVE 08/29/2014 1840   UROBILINOGEN 0.2 08/29/2014 1840   NITRITE NEGATIVE  08/29/2014 1840   LEUKOCYTESUR NEGATIVE 08/29/2014 1840   Sepsis Labs Invalid input(s): PROCALCITONIN,  WBC,  LACTICIDVEN Microbiology No results found for this or any previous visit (from the past 240 hour(s)).   Time coordinating discharge: Over 30 minutes  SIGNED:   Sharene Butters, MD  [facility] 03/04/2018, 3:45 PM   If 7PM-7AM, please contact night-coverage [email or web address] Password [email or web address]

## 2018-03-04 NOTE — Plan of Care (Signed)
Adequate for discharge.

## 2018-03-05 ENCOUNTER — Encounter: Payer: Self-pay | Admitting: Neurology

## 2018-03-07 DIAGNOSIS — Z720 Tobacco use: Secondary | ICD-10-CM | POA: Diagnosis not present

## 2018-03-07 DIAGNOSIS — I6523 Occlusion and stenosis of bilateral carotid arteries: Secondary | ICD-10-CM | POA: Diagnosis not present

## 2018-03-07 DIAGNOSIS — I63411 Cerebral infarction due to embolism of right middle cerebral artery: Secondary | ICD-10-CM | POA: Diagnosis not present

## 2018-03-07 DIAGNOSIS — E7800 Pure hypercholesterolemia, unspecified: Secondary | ICD-10-CM | POA: Diagnosis not present

## 2018-03-08 LAB — HIV 1/2 AB DIFFERENTIATION
HIV 1 AB: NEGATIVE
HIV 2 Ab: NEGATIVE
Note: NEGATIVE

## 2018-03-08 LAB — RNA QUALITATIVE: HIV 1 RNA Qualitative: 1

## 2018-03-08 LAB — HIV ANTIBODY (ROUTINE TESTING W REFLEX): HIV SCREEN 4TH GENERATION: REACTIVE — AB

## 2018-03-12 NOTE — Progress Notes (Signed)
NEUROLOGY CONSULTATION NOTE  [name] MRN: [id number] DOB: March 07, 1956  Referring provider: Ivor Costa, MD (hospital referral) Primary care provider: Marda Stalker, PA-C  Reason for consult:  Stroke and headache  HISTORY OF PRESENT ILLNESS: Lawrence Owen is a 62 year old right-handed male with hypertension, hyperlipidemia, hypothyroidism, tobacco and alcohol use, carotid artery stenosis, and history of tongue cancer in 2002 with x-ray therapy to the neck and lymph node dissection who presents for stroke and headache.  History supplemented by hospital notes.  He was admitted to [facility] from 03/03/2018 to 03/04/2018 for stroke and headache.  He developed left-sided head pressure with left sided facial numbness and unsteady gait with leaning towards the left side.  MRI of the brain was personally reviewed and revealed a 12 mm subacute infarction within the right caudate head and anterior limb of the internal capsule. MRA of head did not reveal aneurysm or large vessel occlusion or stenosis.  MRA of the neck was personally reviewed and revealed bilateral 60% moderate internal carotid artery stenosis.  2D echocardiogram with bubble study demonstrated EF 55-60% with no cardiac source of emboli.  He was found to have siginfiicant atheromatous plaque with mildly dilated ascending aorta.  He was found to exhibit sinus bradycardia.  LDL was 134.  Hgb A1c was 5.4.  He was discharged on ASA 81mg , Plavix 75mg  and Lipitor 80mg  daily. He is scheduled follow up with vascular surgery and cardiology.  Since the stroke, he feels a little faint and unsteady.  Legs feel heavy.  He also reports that about a month prior to the stroke, he had brief episodes of altered vision in his right eye.  He would notice kaleidoscope vision and white out of vision.  There was no associated headache.  It occurred off and on for a week.  He does have history of headaches.  PAST MEDICAL HISTORY: Past Medical  History:  Diagnosis Date  . Cancer of tongue ([place])     PAST SURGICAL HISTORY: Past Surgical History:  Procedure Laterality Date  . NECK SURGERY  2003   lymph node removed    MEDICATIONS: Current Outpatient Medications on File Prior to Visit  Medication Sig Dispense Refill  . acetaminophen (TYLENOL) 500 MG tablet Take 500 mg by mouth every 6 (six) hours as needed for mild pain or fever.    Marland Kitchen aspirin 81 MG chewable tablet Chew 1 tablet (81 mg total) by mouth daily. 30 tablet 0  . atorvastatin (LIPITOR) 80 MG tablet Take 1 tablet (80 mg total) by mouth daily at 6 PM. 30 tablet 0  . clopidogrel (PLAVIX) 75 MG tablet Take 1 tablet (75 mg total) by mouth daily. 30 tablet 0  . Cyanocobalamin (VITAMIN B-12 PO) Take 1 tablet by mouth daily.    . fluticasone (FLONASE) 50 MCG/ACT nasal spray Place 2 sprays into both nostrils daily. (Patient not taking: Reported on 81/07/7515) 16 g 2  . folic acid (FOLVITE) 1 MG tablet Take 1 tablet (1 mg total) by mouth daily. 30 tablet 0  . levothyroxine (SYNTHROID, LEVOTHROID) 100 MCG tablet Take 100 mcg by mouth daily before breakfast.    . Multiple Vitamins-Minerals (EMERGEN-C IMMUNE PO) Take 1 Package by mouth daily.    Marland Kitchen thiamine 100 MG tablet Take 1 tablet (100 mg total) by mouth daily. 30 tablet 0   No current facility-administered medications on file prior to visit.     ALLERGIES: No Known Allergies  FAMILY HISTORY: Family History  Problem Relation Age of Onset  . Emphysema Father    SOCIAL HISTORY: Social History   Socioeconomic History  . Marital status: Single    Spouse name: Not on file  . Number of children: Not on file  . Years of education: Not on file  . Highest education level: Not on file  Occupational History  . Not on file  Social Needs  . Financial resource strain: Not on file  . Food insecurity:    Worry: Not on file    Inability: Not on file  . Transportation needs:    Medical: Not on file    Non-medical: Not on file   Tobacco Use  . Smoking status: Current Every Day Smoker    Packs/day: 0.25    Years: 29.00    Pack years: 7.25    Types: Cigarettes  . Smokeless tobacco: Never Used  Substance and Sexual Activity  . Alcohol use: Yes    Comment: 0-3 beers per day  . Drug use: Yes    Frequency: 2.0 times per week    Types: Marijuana  . Sexual activity: Not on file  Lifestyle  . Physical activity:    Days per week: Not on file    Minutes per session: Not on file  . Stress: Not on file  Relationships  . Social connections:    Talks on phone: Not on file    Gets together: Not on file    Attends religious service: Not on file    Active member of club or organization: Not on file    Attends meetings of clubs or organizations: Not on file    Relationship status: Not on file  . Intimate partner violence:    Fear of current or ex partner: Not on file    Emotionally abused: Not on file    Physically abused: Not on file    Forced sexual activity: Not on file  Other Topics Concern  . 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  Has a degree in architectural engineering    REVIEW OF SYSTEMS: Constitutional: No fevers, chills, or sweats, no generalized fatigue, change in appetite Eyes: No visual changes, double vision, eye pain Ear, nose and throat: No hearing loss, ear pain, nasal congestion, sore throat Cardiovascular: No chest pain, palpitations Respiratory:  No shortness of breath at rest or with exertion, wheezes GastrointestinaI: No nausea, vomiting, diarrhea, abdominal pain, fecal incontinence Genitourinary:  No dysuria, urinary retention or frequency Musculoskeletal:  No neck pain, back pain Integumentary: No rash, pruritus, skin lesions Neurological: as above Psychiatric: No depression, insomnia, anxiety Endocrine: No palpitations,  fatigue, diaphoresis, mood swings, change in appetite, change in weight, increased thirst Hematologic/Lymphatic:  No purpura, petechiae. Allergic/Immunologic: no itchy/runny eyes, nasal congestion, recent allergic reactions, rashes  PHYSICAL EXAM: Blood pressure (!) 140/102, pulse 75, height 5\' 9"  (1.753 m), weight 141 lb (64 kg), SpO2 99 %. General: No acute distress.  Patient appears well-groomed.   Head:  Normocephalic/atraumatic Eyes:  fundi examined but not visualized Neck: supple, no paraspinal tenderness, full range of motion Back: No paraspinal tenderness Heart: regular rate and rhythm Lungs: Clear to auscultation bilaterally. Vascular: No carotid bruits. Neurological Exam: Mental status: alert and oriented to person, place, and time, recent and remote memory intact, fund of knowledge intact, attention and concentration intact, speech  fluent and not dysarthric, language intact. Cranial nerves: CN I: not tested CN II: pupils equal, round and reactive to light, visual fields intact CN III, IV, VI:  full range of motion, no nystagmus, no ptosis CN V: facial sensation intact CN VII: upper and lower face symmetric CN VIII: hearing intact CN IX, X: gag intact, uvula midline CN XI: sternocleidomastoid and trapezius muscles intact CN XII: tongue midline Bulk & Tone: normal, no fasciculations. Motor:  5/5 throughout  Sensation:  temperature and vibration sensation intact.  Deep Tendon Reflexes:  2+ throughout, toes downgoing.  Finger to nose testing:  Without dysmetria.   Heel to shin:  Without dysmetria.   Gait:  Normal station and stride.  Able to turn and tandem walk. Romberg negative.  IMPRESSION: 1.  Right subcortical infarct, likely secondary to small vessel disease. 2.  Bilateral carotid artery disease 3.  Hypertension 4.  Hyperlipidemia 5.  Tobacco use disorder 6.  Ocular migraine.  Episode of visual disturbance consistent with ocular migraine.  PLAN: 1.  Continue  ASA 81mg  and Plavix 75mg  until 10/27, at which point he should discontinue Plavix 75mg  daily and continue ASA alone. 2.  Lipitor 80mg  daily (LDL goal less than 70).  Repeat lipid panel in 3 months 3.  Optimize blood pressure control.  Follow up with PCP regarding blood pressure 4.  Mediterranean diet 5.  Routine exercise 6.  Tobacco cessation counseling (CPT 99406):  Tobacco with history of stroke and cancer  - Currently smoking 0.25 packs/day   - Patient was informed of the dangers of tobacco abuse including stroke, cancer, and MI, as well as benefits of tobacco cessation. - Patient is willing to quit at this time. - Approximately 5 mins were spent counseling patient cessation techniques. We discussed various methods to help quit smoking, including deciding on a date to quit, joining a support group, pharmacological agents- nicotine gum/patch/lozenges, chantix.  - I will reassess his progress at the next follow-up visit 7.  Follow up in 3 to 4 months  Thank you for allowing me to take part in the care of this patient.  Metta Clines, DO  CC:  Lawrence Stalker, PA-C

## 2018-03-13 ENCOUNTER — Encounter: Payer: Self-pay | Admitting: Neurology

## 2018-03-13 ENCOUNTER — Encounter

## 2018-03-13 ENCOUNTER — Ambulatory Visit: Payer: BLUE CROSS/BLUE SHIELD | Admitting: Neurology

## 2018-03-13 VITALS — BP 138/96 | HR 75 | Ht 69.0 in | Wt 141.0 lb

## 2018-03-13 DIAGNOSIS — F17200 Nicotine dependence, unspecified, uncomplicated: Secondary | ICD-10-CM | POA: Diagnosis not present

## 2018-03-13 DIAGNOSIS — I6523 Occlusion and stenosis of bilateral carotid arteries: Secondary | ICD-10-CM | POA: Diagnosis not present

## 2018-03-13 DIAGNOSIS — G43109 Migraine with aura, not intractable, without status migrainosus: Secondary | ICD-10-CM

## 2018-03-13 DIAGNOSIS — I6389 Other cerebral infarction: Secondary | ICD-10-CM | POA: Diagnosis not present

## 2018-03-13 DIAGNOSIS — I10 Essential (primary) hypertension: Secondary | ICD-10-CM

## 2018-03-13 NOTE — Patient Instructions (Addendum)
1.  Continue aspirin 81mg  daily and clopidogrel 75mg  daily until October 27, at which point you may discontinue clopidogrel and just continue aspirin 81mg  daily. 2.  Continue atorvastatin 80mg  daily.  We will recheck lipid panel in 3 months. 3.  Work on quitting smoking 4.  Follow Mediterranean diet (see below) 5.  Routine exercise 6.  Follow up with your PCP regarding blood pressure 7.  Follow up with cardiology and vascular surgery 8.  Follow up with me in 3 to 4 months.

## 2018-04-09 ENCOUNTER — Other Ambulatory Visit: Payer: Self-pay

## 2018-04-09 DIAGNOSIS — I639 Cerebral infarction, unspecified: Secondary | ICD-10-CM

## 2018-05-29 ENCOUNTER — Ambulatory Visit: Payer: BLUE CROSS/BLUE SHIELD | Admitting: Vascular Surgery

## 2018-05-29 ENCOUNTER — Other Ambulatory Visit: Payer: Self-pay

## 2018-05-29 ENCOUNTER — Ambulatory Visit (HOSPITAL_COMMUNITY)
Admission: RE | Admit: 2018-05-29 | Discharge: 2018-05-29 | Disposition: A | Discharge status: Home / Self Care | Payer: BLUE CROSS/BLUE SHIELD | Source: Ambulatory Visit | Attending: Vascular Surgery | Admitting: Vascular Surgery

## 2018-05-29 ENCOUNTER — Encounter: Payer: Self-pay | Admitting: Vascular Surgery

## 2018-05-29 VITALS — BP 193/104 | HR 58 | Temp 97.6°F | Resp 20 | Ht 69.0 in | Wt 141.0 lb

## 2018-05-29 DIAGNOSIS — I6523 Occlusion and stenosis of bilateral carotid arteries: Secondary | ICD-10-CM | POA: Diagnosis not present

## 2018-05-29 DIAGNOSIS — I639 Cerebral infarction, unspecified: Secondary | ICD-10-CM

## 2018-05-29 NOTE — Progress Notes (Signed)
[facility] [place]  Patient name: Lawrence Owen MRN: [id number] DOB: Oct 23, 1955 Sex: male  REASON FOR CONSULT: Discussion of bilateral carotid stenosis  HPI: Lawrence Owen is a 62 y.o. male, who is here today for evaluation of bilateral carotid stenosis.  He is a very pleasant gentleman who presented in October 2019 with headache and deviation to the left when he walked.  Work-up included an MRI showing acute infarct in the right internal capsule.  Moderate suggested bilateral 60% carotid stenosis.  His a stroke was felt not to be related to his carotid disease.  He is seen for follow-up.  Does have a history of tongue cancer and underwent right neck dissection and radiation therapy in 2003.  He does report smoking several cigarettes per day and again I explained the critical importance of absolute smoking cessation.  Past Medical History:  Diagnosis Date  . Cancer of tongue ([place])     Family History  Problem Relation Age of Onset  . Emphysema Father     SOCIAL HISTORY: Social History   Socioeconomic History  . Marital status: Single    Spouse name: Not on file  . Number of children: 3  . Years of education: Not on file  . Highest education level: Not on file  Occupational History  . Occupation: retired  Scientific laboratory technician  . Financial resource strain: Not on file  . Food insecurity:    Worry: Not on file    Inability: Not on file  . Transportation needs:    Medical: Not on file    Non-medical: Not on file  Tobacco Use  . Smoking status: Current Every Day Smoker    Packs/day: 0.25    Years: 29.00    Pack years: 7.25    Types: Cigarettes  . Smokeless tobacco: Never Used  Substance and Sexual Activity  . Alcohol use: Yes    Alcohol/week: 21.0 standard drinks    Types: 21 Cans of beer per week    Comment: 0-3 beers per day  . Drug use: Yes    Frequency: 2.0 times per week    Types: Marijuana  . Sexual  activity: Not on file  Lifestyle  . Physical activity:    Days per week: Not on file    Minutes per session: Not on file  . Stress: Not on file  Relationships  . Social connections:    Talks on phone: Not on file    Gets together: Not on file    Attends religious service: Not on file    Active member of club or organization: Not on file    Attends meetings of clubs or organizations: Not on file    Relationship status: Not on file  . Intimate partner violence:    Fear of current or ex partner: Not on file    Emotionally abused: Not on file    Physically abused: Not on file    Forced sexual activity: Not on file  Other Topics Concern  . 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  Has a degree in architectural engineering      Patient is right-handed. He lives with his long time girl-friend in a 2 story house, Restaurant manager, fast food on main level. He drinks 3-4 cups of coffee a day.    No Known Allergies  Current Outpatient Medications  Medication Sig Dispense Refill  . acetaminophen (TYLENOL) 500 MG tablet Take 500 mg by mouth every 6 (six) hours as needed for mild pain or fever.    Marland Kitchen aspirin 81 MG chewable tablet Chew 1 tablet (81 mg total) by mouth daily. 30 tablet 0  . atorvastatin (LIPITOR) 80 MG tablet Take 1 tablet (80 mg total) by mouth daily at 6 PM. 30 tablet 0  . Cyanocobalamin (VITAMIN B-12 PO) Take 1 tablet by mouth daily.    . fluticasone (FLONASE) 50 MCG/ACT nasal spray Place 2 sprays into both nostrils daily. 16 g 2  . levothyroxine (SYNTHROID, LEVOTHROID) 100 MCG tablet Take 100 mcg by mouth daily before breakfast.    . Multiple Vitamins-Minerals (EMERGEN-C IMMUNE PO) Take 1 Package by mouth daily.    . clopidogrel (PLAVIX) 75 MG tablet Take 1 tablet (75 mg total) by mouth daily. (Patient not taking: Reported on 05/29/2018) 30 tablet 0  . folic acid (FOLVITE) 1  MG tablet Take 1 tablet (1 mg total) by mouth daily. (Patient not taking: Reported on 05/29/2018) 30 tablet 0  . thiamine 100 MG tablet Take 1 tablet (100 mg total) by mouth daily. (Patient not taking: Reported on 05/29/2018) 30 tablet 0   No current facility-administered medications for this visit.     REVIEW OF SYSTEMS:  [X]  denotes positive finding, [ ]  denotes negative finding Cardiac  Comments:  Chest pain or chest pressure:    Shortness of breath upon exertion:    Short of breath when lying flat:    Irregular heart rhythm:        Vascular    Pain in calf, thigh, or hip brought on by ambulation:    Pain in feet at night that wakes you up from your sleep:     Blood clot in your veins:    Leg swelling:         Pulmonary    Oxygen at home:    Productive cough:     Wheezing:         Neurologic    Sudden weakness in arms or legs:  x   Sudden numbness in arms or legs:  x   Sudden onset of difficulty speaking or slurred speech:    Temporary loss of vision in one eye:     Problems with dizziness:         Gastrointestinal    Blood in stool:     Vomited blood:         Genitourinary    Burning when urinating:     Blood in urine:        Psychiatric    Major depression:         Hematologic    Bleeding problems:    Problems with blood clotting too easily:        Skin    Rashes or ulcers:        Constitutional    Fever or chills:      PHYSICAL EXAM: Vitals:   05/29/18 0945 05/29/18 0948  BP: (!) 188/100 (!) 193/104  Pulse: (!) 58   Resp: 20   Temp: 97.6 F (36.4 C)   SpO2: 99%   Weight: 141 lb (64 kg)   Height: 5\' 9"  (1.753 m)     GENERAL: The patient is a well-nourished male, in no acute distress. The vital signs are documented above. CARDIOVASCULAR: Patient does have a harsh right carotid bruit.  Changes of prior neck dissection are present.  No bruit on the left.  2+ radial and 2+ dorsalis pedis pulses bilaterally PULMONARY: There is good air exchange    ABDOMEN: Soft and non-tender  MUSCULOSKELETAL: There are no major deformities or cyanosis. NEUROLOGIC: No focal weakness or paresthesias are detected. SKIN: There are no ulcers or rashes noted. PSYCHIATRIC: The patient has a normal affect.  DATA:  Carotid duplex today reveals 60 to 79% stenosis in the right internal carotid and 70 to 59% stenosis on the left internal carotid  MEDICAL ISSUES: I long discussion with the patient regarding these findings.  Moderate to severe right internal carotid artery stenosis.  Explained this but potentially could be related to radiation therapy but in all likelihood is related to standard atherosclerotic disease.  Would not recommend any treatment currently based on this.  Would recommend 62-month follow-up to rule out any progression of stenosis.  If he does have progression, would require CT angiogram to determine his specific anatomy related to his prior radiation therapy.  He knows to repeat report immediately to the emergency department should he have any new neurologic deficit.   Rosetta Posner, MD FACS [facility] Tel [phone] Pager [phone]

## 2018-07-20 NOTE — Progress Notes (Signed)
NEUROLOGY FOLLOW UP OFFICE NOTE  Lawrence Owen [id number]  HISTORY OF PRESENT ILLNESS: Lawrence Owen is a 63 year old right-handed Caucasian man with hypertension, hyperlipidemia, hypothyroidism, carotid artery stenosis, tobacco and alcohol use and history of tongue cancer in 2002 with x-ray therapy to the neck and lymph node dissection who follows up for stroke.  She is accompanied by his wife who supplements history.  UPDATE:  He is currently on aspirin 81 mg daily.  Plavix has since been discontinued.  He is also on Lipitor 80 mg.  He sometimes forgets to take the Lipitor in the evening (misses a dose 2 to 3 times a week).  He followed up with Dr. Donnetta Hutching of vascular surgery who did not recommend any interventional treatment of his right internal carotid artery stenosis at this time.  It is not clear if the carotid artery stenosis is due to atherosclerotic disease or possibly radiation therapy but most likely atherosclerotic disease.  He recommended monitoring and repeating carotid ultrasound to evaluate for any progression of stenosis.  Once in awhile he still notes leg heaviness that may cause unsteadiness.  But overall, doing well.  HISTORY:  The patient was admitted to [facility] from 03/03/2018 to 03/04/2018 for stroke and headache.  He developed left-sided head pressure with left-sided facial numbness and unsteady gait with leaning towards the left side.  MRI of the brain revealed a 12 mm subacute infarction within the right caudate head and anterior limb of the internal capsule.  MRA of the head did not reveal aneurysm or large vessel occlusion or stenosis.  MRA of the neck revealed bilateral 60% moderate internal carotid artery stenosis.  2D echocardiogram with bubble study demonstrated EF 55-60% with no cardiac source of embolus.  He was found to have significant atheromatous plaque with mildly dilated ascending aorta.  He was found to exhibit sinus bradycardia.  LDL was 134.   Hemoglobin A1c was 5.4.  He was discharged on aspirin 81 mg, Plavix 75 mg and Lipitor 80 mg daily.  Since the stroke, he feels a little faint and unsteady.  Legs feel heavy.  He also reports that about a month prior to the stroke, he had brief episodes of altered vision in his right eye.  He would notice kaleidoscope vision and white out of vision.  There was no associated headache.  It occurred off and on for a week.  He does have a history of headaches.  PAST MEDICAL HISTORY: Past Medical History:  Diagnosis Date  . Cancer of tongue [facility])     MEDICATIONS: Current Outpatient Medications on File Prior to Visit  Medication Sig Dispense Refill  . acetaminophen (TYLENOL) 500 MG tablet Take 500 mg by mouth every 6 (six) hours as needed for mild pain or fever.    Marland Kitchen aspirin 81 MG chewable tablet Chew 1 tablet (81 mg total) by mouth daily. 30 tablet 0  . atorvastatin (LIPITOR) 80 MG tablet Take 1 tablet (80 mg total) by mouth daily at 6 PM. 30 tablet 0  . clopidogrel (PLAVIX) 75 MG tablet Take 1 tablet (75 mg total) by mouth daily. (Patient not taking: Reported on 05/29/2018) 30 tablet 0  . Cyanocobalamin (VITAMIN B-12 PO) Take 1 tablet by mouth daily.    . fluticasone (FLONASE) 50 MCG/ACT nasal spray Place 2 sprays into both nostrils daily. 16 g 2  . folic acid (FOLVITE) 1 MG tablet Take 1 tablet (1 mg total) by mouth daily. (Patient not taking: Reported on  05/29/2018) 30 tablet 0  . levothyroxine (SYNTHROID, LEVOTHROID) 100 MCG tablet Take 100 mcg by mouth daily before breakfast.    . Multiple Vitamins-Minerals (EMERGEN-C IMMUNE PO) Take 1 Package by mouth daily.    Marland Kitchen thiamine 100 MG tablet Take 1 tablet (100 mg total) by mouth daily. (Patient not taking: Reported on 05/29/2018) 30 tablet 0   No current facility-administered medications on file prior to visit.     ALLERGIES: No Known Allergies  FAMILY HISTORY: Family History  Problem Relation Age of Onset  . Emphysema Father    SOCIAL  HISTORY: Social History   Socioeconomic History  . Marital status: Single    Spouse name: Not on file  . Number of children: 3  . Years of education: Not on file  . Highest education level: Not on file  Occupational History  . Occupation: retired  Scientific laboratory technician  . Financial resource strain: Not on file  . Food insecurity:    Worry: Not on file    Inability: Not on file  . Transportation needs:    Medical: Not on file    Non-medical: Not on file  Tobacco Use  . Smoking status: Current Every Day Smoker    Packs/day: 0.25    Years: 29.00    Pack years: 7.25    Types: Cigarettes  . Smokeless tobacco: Never Used  Substance and Sexual Activity  . Alcohol use: Yes    Alcohol/week: 21.0 standard drinks    Types: 21 Cans of beer per week    Comment: 0-3 beers per day  . Drug use: Yes    Frequency: 2.0 times per week    Types: Marijuana  . Sexual activity: Not on file  Lifestyle  . Physical activity:    Days per week: Not on file    Minutes per session: Not on file  . Stress: Not on file  Relationships  . Social connections:    Talks on phone: Not on file    Gets together: Not on file    Attends religious service: Not on file    Active member of club or organization: Not on file    Attends meetings of clubs or organizations: Not on file    Relationship status: Not on file  . Intimate partner violence:    Fear of current or ex partner: Not on file    Emotionally abused: Not on file    Physically abused: Not on file    Forced sexual activity: Not on file  Other Topics Concern  . 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  Has a degree in architectural engineering      Patient is right-handed. He lives with his long time girl-friend in a 2 story house, Restaurant manager, fast food on main level. He drinks 3-4 cups of coffee a day.    REVIEW OF SYSTEMS: Constitutional: No  fevers, chills, or sweats, no generalized fatigue, change in appetite Eyes: No visual changes, double vision, eye pain Ear, nose and throat: No hearing loss, ear pain, nasal congestion, sore throat Cardiovascular: No chest pain, palpitations Respiratory:  No shortness of breath at rest or with exertion, wheezes GastrointestinaI: No nausea, vomiting, diarrhea, abdominal pain, fecal incontinence Genitourinary:  No dysuria, urinary retention or frequency Musculoskeletal:  No neck pain, back pain Integumentary: No rash, pruritus, skin lesions Neurological: as above Psychiatric: No depression, insomnia, anxiety  Endocrine: No palpitations, fatigue, diaphoresis, mood swings, change in appetite, change in weight, increased thirst Hematologic/Lymphatic:  No purpura, petechiae. Allergic/Immunologic: no itchy/runny eyes, nasal congestion, recent allergic reactions, rashes  PHYSICAL EXAM: Blood pressure (!) 178/88, pulse 72, height 5\' 9"  (1.753 m), weight 145 lb (65.8 kg), SpO2 97 %. General: No acute distress.  Patient appears well-groomed.   Head:  Normocephalic/atraumatic Eyes:  Fundi examined but not visualized Neck: supple, no paraspinal tenderness, full range of motion Heart:  Regular rate and rhythm Lungs:  Clear to auscultation bilaterally Back: No paraspinal tenderness Neurological Exam: alert and oriented to person, place, and time. Attention span and concentration intact, recent and remote memory intact, fund of knowledge intact.  Speech fluent and not dysarthric, language intact.  CN II-XII intact. Bulk and tone normal, muscle strength 5/5 throughout.  Sensation to light touch, temperature and vibration intact.  Deep tendon reflexes 2+ throughout, toes downgoing.  Finger to nose and heel to shin testing intact.  Gait normal, Romberg negative.  IMPRESSION: 1.  Right subcortical infarct, likely secondary to small vessel disease. 2.  Bilateral carotid artery disease 3.  Hypertension 4.   Hyperlipidemia 5.  Tobacco use disorder 6.  Ocular migraine.  PLAN: 1.  Continue aspirin 81mg  daily for secondary stroke prevention. 2.  Continue Lipitor 80mg  daily.  Advised to take action to remember to take daily.  LDL goal less than 70. 3.  Continue blood pressure control 4.  Mediterranean diet 5.  Tobacco cessation 6.  Follow up in 6 months.  20 minutes spent face to face with patient, over 50% spent discussing management.  Metta Clines, DO  CC: Marda Stalker, PA-C

## 2018-07-23 ENCOUNTER — Encounter: Payer: Self-pay | Admitting: Neurology

## 2018-07-23 ENCOUNTER — Ambulatory Visit: Payer: BLUE CROSS/BLUE SHIELD | Admitting: Neurology

## 2018-07-23 VITALS — BP 178/88 | HR 72 | Ht 69.0 in | Wt 145.0 lb

## 2018-07-23 DIAGNOSIS — G43109 Migraine with aura, not intractable, without status migrainosus: Secondary | ICD-10-CM

## 2018-07-23 DIAGNOSIS — I6523 Occlusion and stenosis of bilateral carotid arteries: Secondary | ICD-10-CM | POA: Diagnosis not present

## 2018-07-23 DIAGNOSIS — I10 Essential (primary) hypertension: Secondary | ICD-10-CM

## 2018-07-23 DIAGNOSIS — I6389 Other cerebral infarction: Secondary | ICD-10-CM | POA: Diagnosis not present

## 2018-07-23 DIAGNOSIS — F17200 Nicotine dependence, unspecified, uncomplicated: Secondary | ICD-10-CM

## 2018-07-23 NOTE — Patient Instructions (Signed)
1.  Continue aspirin 81mg  daily 2.  Continue Lipitor 80mg .  Take daily 3.  Continue blood pressure medication 4.  Try to work on quitting smoking 5.  Follow up in 6 months

## 2018-10-17 DIAGNOSIS — I6523 Occlusion and stenosis of bilateral carotid arteries: Secondary | ICD-10-CM | POA: Diagnosis not present

## 2018-10-17 DIAGNOSIS — Z720 Tobacco use: Secondary | ICD-10-CM | POA: Diagnosis not present

## 2018-10-17 DIAGNOSIS — E039 Hypothyroidism, unspecified: Secondary | ICD-10-CM | POA: Diagnosis not present

## 2018-10-17 DIAGNOSIS — E7800 Pure hypercholesterolemia, unspecified: Secondary | ICD-10-CM | POA: Diagnosis not present

## 2018-10-17 DIAGNOSIS — I63411 Cerebral infarction due to embolism of right middle cerebral artery: Secondary | ICD-10-CM | POA: Diagnosis not present

## 2018-12-07 ENCOUNTER — Other Ambulatory Visit: Payer: Self-pay

## 2018-12-07 DIAGNOSIS — I6523 Occlusion and stenosis of bilateral carotid arteries: Secondary | ICD-10-CM

## 2018-12-11 ENCOUNTER — Ambulatory Visit (HOSPITAL_COMMUNITY)
Admission: RE | Admit: 2018-12-11 | Discharge: 2018-12-11 | Disposition: A | Discharge status: Home / Self Care | Payer: BC Managed Care – PPO | Source: Ambulatory Visit | Attending: Family | Admitting: Family

## 2018-12-11 ENCOUNTER — Other Ambulatory Visit: Payer: Self-pay

## 2018-12-11 ENCOUNTER — Ambulatory Visit: Payer: BLUE CROSS/BLUE SHIELD | Admitting: Family

## 2018-12-11 DIAGNOSIS — I6523 Occlusion and stenosis of bilateral carotid arteries: Secondary | ICD-10-CM | POA: Insufficient documentation

## 2018-12-19 ENCOUNTER — Ambulatory Visit (INDEPENDENT_AMBULATORY_CARE_PROVIDER_SITE_OTHER): Payer: BC Managed Care – PPO | Admitting: Family

## 2018-12-19 ENCOUNTER — Other Ambulatory Visit: Payer: Self-pay

## 2018-12-19 ENCOUNTER — Encounter: Payer: Self-pay | Admitting: Family

## 2018-12-19 DIAGNOSIS — I6523 Occlusion and stenosis of bilateral carotid arteries: Secondary | ICD-10-CM | POA: Diagnosis not present

## 2018-12-19 DIAGNOSIS — F17200 Nicotine dependence, unspecified, uncomplicated: Secondary | ICD-10-CM | POA: Diagnosis not present

## 2018-12-19 NOTE — Progress Notes (Signed)
Virtual Visit via Telephone Note  I connected with Lawrence Owen on 12/19/2018 using the [email or web address] by telephone and verified that I was speaking with the correct person using two identifiers. Patient was located at his home and accompanied by himself. I am located at the [facility] office.   The limitations of evaluation and management by telemedicine and the availability of in person appointments have been previously discussed with the patient and are documented in the patients chart. The patient expressed understanding and consented to proceed.  PCP: Marda Stalker, PA-C  Chief Complaint: follow up extracranial carotid artery stenosis   History of Present Illness: Lawrence Owen is a 63 y.o. male who presented in October 2019 with headache and deviation to the left when he walked.  Work-up included an MRI showing acute infarct in the right internal capsule and bilateral 60% carotid stenosis.  His stroke in the Fall of 2019 was felt not to be related to his carotid disease. He denies any subsequent neurological event other than generalize dizziness, he attributes this to lisinopril.    He has a history of tongue cancer and underwent right neck dissection and radiation therapy in 2003.   He does report smoking several cigarettes per day and again Dr. Donnetta Hutching explained the critical importance of absolute smoking cessation at pt visit on 05-29-18. At that visit carotid duplex revealed 60 to 79% stenosis in the right internal carotid and 40 to 59% stenosis on the left internal carotid  Moderate to severe right internal carotid artery stenosis.  Dr. Donnetta Hutching explained to pt this could potentially could be related to radiation therapy but in all likelihood is related to standard atherosclerotic disease.  Dr. Donnetta Hutching would not recommend any treatment at that time based on this, he recommended 36-month follow-up to rule out any progression of stenosis.  If he does have progression, would require CT angiogram to  determine his specific anatomy related to his prior radiation therapy.  He knows to repeat report immediately to the emergency department should he have any new neurologic deficit.  He smokes 3-5 cigarettes/day. He does not have DM.     Past Medical History:  Diagnosis Date  . Cancer of tongue [facility])     Past Surgical History:  Procedure Laterality Date  . NECK SURGERY  2003   lymph node removed    Current Meds  Medication Sig  . acetaminophen (TYLENOL) 500 MG tablet Take 500 mg by mouth every 6 (six) hours as needed for mild pain or fever.  Marland Kitchen aspirin 81 MG chewable tablet Chew 1 tablet (81 mg total) by mouth daily.  Marland Kitchen atorvastatin (LIPITOR) 80 MG tablet Take 1 tablet (80 mg total) by mouth daily at 6 PM.  . Cyanocobalamin (VITAMIN B-12 PO) Take 1 tablet by mouth daily.  . fluticasone (FLONASE) 50 MCG/ACT nasal spray Place 2 sprays into both nostrils daily.  . folic acid (FOLVITE) 1 MG tablet Take 1 tablet (1 mg total) by mouth daily.  Marland Kitchen levothyroxine (SYNTHROID, LEVOTHROID) 100 MCG tablet Take 100 mcg by mouth daily before breakfast.  . Multiple Vitamins-Minerals (EMERGEN-C IMMUNE PO) Take 1 Package by mouth daily.  Marland Kitchen thiamine 100 MG tablet Take 1 tablet (100 mg total) by mouth daily.    12 system ROS was negative unless otherwise noted in HPI   Observations/Objective:  DATA Carotid Duplex (12-11-18): Right Carotid Findings: +----------+--------+--------+--------+------------+--------+           PSV cm/sEDV cm/sStenosisDescribe    Comments +----------+--------+--------+--------+------------+--------+  CCA Prox  94      27                                   +----------+--------+--------+--------+------------+--------+ CCA Mid   83      26              heterogenous         +----------+--------+--------+--------+------------+--------+ CCA Distal49      19              heterogenous          +----------+--------+--------+--------+------------+--------+ ICA Prox  480     181     80-99%  heterogenous         +----------+--------+--------+--------+------------+--------+ ICA Mid   160     42                                   +----------+--------+--------+--------+------------+--------+ ICA Distal73      27                                   +----------+--------+--------+--------+------------+--------+ ECA       64      15                                   +----------+--------+--------+--------+------------+--------+  +----------+--------+-------+----------------+-------------------+           PSV cm/sEDV cmsDescribe        Arm Pressure (mmHG) +----------+--------+-------+----------------+-------------------+ [id number]     5      Multiphasic, WNL                    +----------+--------+-------+----------------+-------------------+  +---------+--------+--+--------+--+---------+ VertebralPSV cm/s51EDV cm/s20Antegrade +---------+--------+--+--------+--+---------+    Left Carotid Findings: +----------+--------+--------+--------+------------+--------+           PSV cm/sEDV cm/sStenosisDescribe    Comments +----------+--------+--------+--------+------------+--------+ CCA Prox  95      31              homogeneous          +----------+--------+--------+--------+------------+--------+ CCA Mid   89      35              homogeneous          +----------+--------+--------+--------+------------+--------+ CCA Distal90      34              heterogenous         +----------+--------+--------+--------+------------+--------+ ICA Prox  189     71      60-79%  heterogenous         +----------+--------+--------+--------+------------+--------+ ICA Mid   133     37                                   +----------+--------+--------+--------+------------+--------+ ICA Distal107     31                                    +----------+--------+--------+--------+------------+--------+ ECA       89      19                                   +----------+--------+--------+--------+------------+--------+  +----------+--------+--------+----------------+-------------------+  SubclavianPSV cm/sEDV cm/sDescribe        Arm Pressure (mmHG) +----------+--------+--------+----------------+-------------------+           109     1       Multiphasic, WNL                    +----------+--------+--------+----------------+-------------------+  +---------+--------+--+--------+--+---------+ VertebralPSV cm/s35EDV cm/s15Antegrade +---------+--------+--+--------+--+---------+    Summary: Right Carotid: Velocities in the right ICA are consistent with a 80-99% stenosis. Left Carotid: Velocities in the left ICA are consistent with a 60-79% stenosis. Vertebrals:  Bilateral vertebral arteries demonstrate antegrade flow. Subclavians: Normal flow hemodynamics were seen in bilateral subclavian arteries.   Assessment and Plan: Carotid duplex today shows increased stenosis of the extracranial carotid arteries, 80-99% in the right, 60-79% in the left.  His atherosclerotic risk factors include continued smoking. I discussed with him the critical importance of smoking cessation.  Continue daily 81 mg ASA and a statin.   Follow Up Instructions:  Schedule CTA neck ASAP to determine his specific anatomy related to his prior radiation therapy. See Dr. Donnetta Hutching afterward.    I discussed the assessment and treatment plan with the patient. The patient was provided an opportunity to ask questions and all were answered. The patient agreed with the plan and demonstrated an understanding of the instructions.   The patient was advised to call back or seek an in-person evaluation if the symptoms worsen or if the condition fails to improve as anticipated.  I spent 10 minutes with the patient via telephone  encounter.   Gabrielle Dare  [facility] Office: [phone]  12/19/2018, 1:39 PM

## 2018-12-19 NOTE — Patient Instructions (Signed)
Steps to Quit Smoking Smoking tobacco is the leading cause of preventable death. It can affect almost every organ in the body. Smoking puts you and people around you at risk for many serious, long-lasting (chronic) diseases. Quitting smoking can be hard, but it is one of the best things that you can do for your health. It is never too late to quit. How do I get ready to quit? When you decide to quit smoking, make a plan to help you succeed. Before you quit:  Pick a date to quit. Set a date within the next 2 weeks to give you time to prepare.  Write down the reasons why you are quitting. Keep this list in places where you will see it often.  Tell your family, friends, and co-workers that you are quitting. Their support is important.  Talk with your doctor about the choices that may help you quit.  Find out if your health insurance will pay for these treatments.  Know the people, places, things, and activities that make you want to smoke (triggers). Avoid them. What first steps can I take to quit smoking?  Throw away all cigarettes at home, at work, and in your car.  Throw away the things that you use when you smoke, such as ashtrays and lighters.  Clean your car. Make sure to empty the ashtray.  Clean your home, including curtains and carpets. What can I do to help me quit smoking? Talk with your doctor about taking medicines and seeing a counselor at the same time. You are more likely to succeed when you do both.  If you are pregnant or breastfeeding, talk with your doctor about counseling or other ways to quit smoking. Do not take medicine to help you quit smoking unless your doctor tells you to do so. To quit smoking: Quit right away  Quit smoking totally, instead of slowly cutting back on how much you smoke over a period of time.  Go to counseling. You are more likely to quit if you go to counseling sessions regularly. Take medicine You may take medicines to help you quit. Some  medicines need a prescription, and some you can buy over-the-counter. Some medicines may contain a drug called nicotine to replace the nicotine in cigarettes. Medicines may:  Help you to stop having the desire to smoke (cravings).  Help to stop the problems that come when you stop smoking (withdrawal symptoms). Your doctor may ask you to use:  Nicotine patches, gum, or lozenges.  Nicotine inhalers or sprays.  Non-nicotine medicine that is taken by mouth. Find resources Find resources and other ways to help you quit smoking and remain smoke-free after you quit. These resources are most helpful when you use them often. They include:  Online chats with a counselor.  Phone quitlines.  Printed self-help materials.  Support groups or group counseling.  Text messaging programs.  Mobile phone apps. Use apps on your mobile phone or tablet that can help you stick to your quit plan. There are many free apps for mobile phones and tablets as well as websites. Examples include Quit Guide from the CDC and smokefree.gov  What things can I do to make it easier to quit?   Talk to your family and friends. Ask them to support and encourage you.  Call a phone quitline (1-800-QUIT-NOW), reach out to support groups, or work with a counselor.  Ask people who smoke to not smoke around you.  Avoid places that make you want to smoke,   such as: ? Bars. ? Parties. ? Smoke-break areas at work.  Spend time with people who do not smoke.  Lower the stress in your life. Stress can make you want to smoke. Try these things to help your stress: ? Getting regular exercise. ? Doing deep-breathing exercises. ? Doing yoga. ? Meditating. ? Doing a body scan. To do this, close your eyes, focus on one area of your body at a time from head to toe. Notice which parts of your body are tense. Try to relax the muscles in those areas. How will I feel when I quit smoking? Day 1 to 3 weeks Within the first 24 hours,  you may start to have some problems that come from quitting tobacco. These problems are very bad 2-3 days after you quit, but they do not often last for more than 2-3 weeks. You may get these symptoms:  Mood swings.  Feeling restless, nervous, angry, or annoyed.  Trouble concentrating.  Dizziness.  Strong desire for high-sugar foods and nicotine.  Weight gain.  Trouble pooping (constipation).  Feeling like you may vomit (nausea).  Coughing or a sore throat.  Changes in how the medicines that you take for other issues work in your body.  Depression.  Trouble sleeping (insomnia). Week 3 and afterward After the first 2-3 weeks of quitting, you may start to notice more positive results, such as:  Better sense of smell and taste.  Less coughing and sore throat.  Slower heart rate.  Lower blood pressure.  Clearer skin.  Better breathing.  Fewer sick days. Quitting smoking can be hard. Do not give up if you fail the first time. Some people need to try a few times before they succeed. Do your best to stick to your quit plan, and talk with your doctor if you have any questions or concerns. Summary  Smoking tobacco is the leading cause of preventable death. Quitting smoking can be hard, but it is one of the best things that you can do for your health.  When you decide to quit smoking, make a plan to help you succeed.  Quit smoking right away, not slowly over a period of time.  When you start quitting, seek help from your doctor, family, or friends. This information is not intended to replace advice given to you by your health care provider. Make sure you discuss any questions you have with your health care provider. Document Released: 03/12/2009 Document Revised: 08/03/2018 Document Reviewed: 08/04/2018 Elsevier Patient Education  2020 Else[employer]     Stroke Prevention Some medical conditions and lifestyle choices can lead to a higher risk for a stroke. You can  help to prevent a stroke by making nutrition, lifestyle, and other changes. What nutrition changes can be made?   Eat healthy foods. ? Choose foods that are high in fiber. These include:  Fresh fruits.  Fresh vegetables.  Whole grains. ? Eat at least 5 or more servings of fruits and vegetables each day. Try to fill half of your plate at each meal with fruits and vegetables. ? Choose lean protein foods. These include:  Lowfat (lean) cuts of meat.  Chicken without skin.  Fish.  Tofu.  Beans.  Nuts. ? Eat low-fat dairy products. ? Avoid foods that:  Are high in salt (sodium).  Have saturated fat.  Have trans fat.  Have cholesterol.  Are processed.  Are premade.  Follow eating guidelines as told by your doctor. These may include: ? Reducing how many calories you   eat and drink each day. ? Limiting how much salt you eat or drink each day to 1,500 milligrams (mg). ? Using only healthy fats for cooking. These include:  Olive oil.  Canola oil.  Sunflower oil. ? Counting how many carbohydrates you eat and drink each day. What lifestyle changes can be made?  Try to stay at a healthy weight. Talk to your doctor about what a good weight is for you.  Get at least 30 minutes of moderate physical activity at least 5 days a week. This can include: ? Fast walking. ? Biking. ? Swimming.  Do not use any products that have nicotine or tobacco. This includes cigarettes and e-cigarettes. If you need help quitting, ask your doctor. Avoid being around tobacco smoke in general.  Limit how much alcohol you drink to no more than 1 drink a day for nonpregnant women and 2 drinks a day for men. One drink equals 12 oz of beer, 5 oz of wine, or 1 oz of hard liquor.  Do not use drugs.  Avoid taking birth control pills. Talk to your doctor about the risks of taking birth control pills if: ? You are over 35 years old. ? You smoke. ? You get migraines. ? You have had a blood clot.  What other changes can be made?  Manage your cholesterol. ? It is important to eat a healthy diet. ? If your cholesterol cannot be managed through your diet, you may also need to take medicines. Take medicines as told by your doctor.  Manage your diabetes. ? It is important to eat a healthy diet and to exercise regularly. ? If your blood sugar cannot be managed through diet and exercise, you may need to take medicines. Take medicines as told by your doctor.  Control your high blood pressure (hypertension). ? Try to keep your blood pressure below 130/80. This can help lower your risk of stroke. ? It is important to eat a healthy diet and to exercise regularly. ? If your blood pressure cannot be managed through diet and exercise, you may need to take medicines. Take medicines as told by your doctor. ? Ask your doctor if you should check your blood pressure at home. ? Have your blood pressure checked every year. Do this even if your blood pressure is normal.  Talk to your doctor about getting checked for a sleep disorder. Signs of this can include: ? Snoring a lot. ? Feeling very tired.  Take over-the-counter and prescription medicines only as told by your doctor. These may include aspirin or blood thinners (antiplatelets or anticoagulants).  Make sure that any other medical conditions you have are managed. Where to find more information  American Stroke Association: www.strokeassociation.org  National Stroke Association: www.stroke.org Get help right away if:  You have any symptoms of stroke. "BE FAST" is an easy way to remember the main warning signs: ? B - Balance. Signs are dizziness, sudden trouble walking, or loss of balance. ? E - Eyes. Signs are trouble seeing or a sudden change in how you see. ? F - Face. Signs are sudden weakness or loss of feeling of the face, or the face or eyelid drooping on one side. ? A - Arms. Signs are weakness or loss of feeling in an arm. This  happens suddenly and usually on one side of the body. ? S - Speech. Signs are sudden trouble speaking, slurred speech, or trouble understanding what people say. ? T - Time. Time to call emergency   services. Write down what time symptoms started.  You have other signs of stroke, such as: ? A sudden, very bad headache with no known cause. ? Feeling sick to your stomach (nausea). ? Throwing up (vomiting). ? Jerky movements you cannot control (seizure). These symptoms may represent a serious problem that is an emergency. Do not wait to see if the symptoms will go away. Get medical help right away. Call your local emergency services (911 in the [place] Do not drive yourself to the hospital. Summary  You can prevent a stroke by eating healthy, exercising, not smoking, drinking less alcohol, and treating other health problems, such as diabetes, high blood pressure, or high cholesterol.  Do not use any products that contain nicotine or tobacco, such as cigarettes and e-cigarettes.  Get help right away if you have any signs or symptoms of a stroke. This information is not intended to replace advice given to you by your health care provider. Make sure you discuss any questions you have with your health care provider. Document Released: 11/15/2011 Document Revised: 07/12/2018 Document Reviewed: 08/17/2016 Elsevier Patient Education  2020 Elsevier Inc.  

## 2018-12-20 ENCOUNTER — Other Ambulatory Visit: Payer: Self-pay | Admitting: Vascular Surgery

## 2018-12-20 DIAGNOSIS — I639 Cerebral infarction, unspecified: Secondary | ICD-10-CM

## 2018-12-20 DIAGNOSIS — I6523 Occlusion and stenosis of bilateral carotid arteries: Secondary | ICD-10-CM

## 2018-12-31 ENCOUNTER — Ambulatory Visit
Admission: RE | Admit: 2018-12-31 | Discharge: 2018-12-31 | Disposition: A | Discharge status: Home / Self Care | Payer: BC Managed Care – PPO | Source: Ambulatory Visit | Attending: Vascular Surgery | Admitting: Vascular Surgery

## 2018-12-31 ENCOUNTER — Other Ambulatory Visit: Payer: BC Managed Care – PPO

## 2018-12-31 ENCOUNTER — Other Ambulatory Visit: Payer: Self-pay

## 2018-12-31 DIAGNOSIS — I639 Cerebral infarction, unspecified: Secondary | ICD-10-CM

## 2018-12-31 DIAGNOSIS — I6523 Occlusion and stenosis of bilateral carotid arteries: Secondary | ICD-10-CM

## 2018-12-31 DIAGNOSIS — Z85810 Personal history of malignant neoplasm of tongue: Secondary | ICD-10-CM | POA: Diagnosis not present

## 2018-12-31 DIAGNOSIS — I6502 Occlusion and stenosis of left vertebral artery: Secondary | ICD-10-CM | POA: Diagnosis not present

## 2018-12-31 MED ORDER — IOPAMIDOL (ISOVUE-370) INJECTION 76%
75.0000 mL | Freq: Once | INTRAVENOUS | Status: AC | PRN
  Administered 2018-12-31: 75 mL via INTRAVENOUS

## 2019-01-01 ENCOUNTER — Ambulatory Visit (INDEPENDENT_AMBULATORY_CARE_PROVIDER_SITE_OTHER): Payer: BC Managed Care – PPO | Admitting: Vascular Surgery

## 2019-01-01 ENCOUNTER — Encounter: Payer: Self-pay | Admitting: Vascular Surgery

## 2019-01-01 ENCOUNTER — Other Ambulatory Visit: Payer: Self-pay

## 2019-01-01 VITALS — BP 151/102 | HR 68 | Temp 96.4°F | Resp 20 | Ht 69.0 in | Wt 141.3 lb

## 2019-01-01 DIAGNOSIS — I6523 Occlusion and stenosis of bilateral carotid arteries: Secondary | ICD-10-CM

## 2019-01-01 NOTE — Progress Notes (Signed)
[facility] [place]  Patient name: Lawrence Owen MRN: [id number] DOB: 1956-05-26 Sex: male  REASON FOR VISIT: Discuss carotid stenosis  HPI: Lawrence Owen is a 63 y.o. male here today for continued follow-up of his carotid stenosis.  He had a posterior circulation stroke proximately 8 months ago and at that time had a work-up that revealed asymptomatic bilateral 60% carotid stenosis.  He was recently seen in our office with carotid duplex surveillance follow-up and was found to have progression of his right carotid stenosis.  He separately underwent CT scan is here today for discussion of this.  He has had no focal neurologic deficits.  Past Medical History:  Diagnosis Date  . Cancer of tongue ([place])     Family History  Problem Relation Age of Onset  . Emphysema Father     SOCIAL HISTORY: Social History   Tobacco Use  . Smoking status: Current Every Day Smoker    Packs/day: 0.25    Years: 29.00    Pack years: 7.25    Types: Cigarettes  . Smokeless tobacco: Never Used  Substance Use Topics  . Alcohol use: Yes    Alcohol/week: 21.0 standard drinks    Types: 21 Cans of beer per week    Comment: 0-3 beers per day    No Known Allergies  Current Outpatient Medications  Medication Sig Dispense Refill  . acetaminophen (TYLENOL) 500 MG tablet Take 500 mg by mouth every 6 (six) hours as needed for mild pain or fever.    Marland Kitchen aspirin 81 MG chewable tablet Chew 1 tablet (81 mg total) by mouth daily. 30 tablet 0  . atorvastatin (LIPITOR) 80 MG tablet Take 1 tablet (80 mg total) by mouth daily at 6 PM. 30 tablet 0  . Cyanocobalamin (VITAMIN B-12 PO) Take 1 tablet by mouth daily.    . fluticasone (FLONASE) 50 MCG/ACT nasal spray Place 2 sprays into both nostrils daily. 16 g 2  . folic acid (FOLVITE) 1 MG tablet Take 1 tablet (1 mg total) by mouth daily. 30 tablet 0  . levothyroxine (SYNTHROID, LEVOTHROID) 100 MCG tablet Take  100 mcg by mouth daily before breakfast.    . losartan (COZAAR) 50 MG tablet Take 50 mg by mouth daily.    . Multiple Vitamins-Minerals (EMERGEN-C IMMUNE PO) Take 1 Package by mouth daily.    Marland Kitchen thiamine 100 MG tablet Take 1 tablet (100 mg total) by mouth daily. 30 tablet 0   No current facility-administered medications for this visit.     REVIEW OF SYSTEMS:  [X]  denotes positive finding, [ ]  denotes negative finding Cardiac  Comments:  Chest pain or chest pressure:    Shortness of breath upon exertion:    Short of breath when lying flat:    Irregular heart rhythm:        Vascular    Pain in calf, thigh, or hip brought on by ambulation:    Pain in feet at night that wakes you up from your sleep:     Blood clot in your veins:    Leg swelling:           PHYSICAL EXAM: Vitals:   01/01/19 1452  BP: (!) 151/102  Pulse: 68  Resp: 20  Temp: (!) 96.4 F (35.8 C)  SpO2: 96%  Weight: 141 lb 4.8 oz (64.1 kg)  Height: 5\' 9"  (1.753 m)    GENERAL: The patient is a well-nourished male, in no acute distress. The  vital signs are documented above. CARDIOVASCULAR: Prior right neck dissection with extensive surgery on the right neck.  I do not hear bruits bilaterally.  2+ radial pulses bilaterally PULMONARY: There is good air exchange  MUSCULOSKELETAL: There are no major deformities or cyanosis. NEUROLOGIC: No focal weakness or paresthesias are detected. SKIN: There are no ulcers or rashes noted. PSYCHIATRIC: The patient has a normal affect.  DATA:  CT scan from 12/31/2018 was reviewed with the patient.  We reviewed his actual images as well.  This does show progression to high-grade stenosis of his right internal carotid artery above the level of the bifurcation.  No significant change in his left internal carotid approximately 60% stenosis  MEDICAL ISSUES: I had a very long discussion with the patient regarding these findings.  I explained that we would typically recommend elective  endarterectomy for a high-grade asymptomatic carotid stenosis.  I explained that his prior right neck dissection and radiation therapy for tongue cancer in 2002 would put him at some increased risk for treatment.  I did explain the option of carotid stenting versus carotid endarterectomy due to his radiation treatment.  I explained those transfemoral treatment and TCAR technique.  Will review films with the other surgeons who are offering carotid stenting in our practice and make a recommendation to the patient.  He understands and we will discuss this further    Rosetta Posner, MD [facility] Tel [phone] Pager [phone]

## 2019-01-02 ENCOUNTER — Other Ambulatory Visit: Payer: Self-pay | Admitting: *Deleted

## 2019-01-02 ENCOUNTER — Telehealth: Payer: Self-pay | Admitting: *Deleted

## 2019-01-02 DIAGNOSIS — I6523 Occlusion and stenosis of bilateral carotid arteries: Secondary | ICD-10-CM

## 2019-01-02 MED ORDER — CLOPIDOGREL BISULFATE 75 MG PO TABS: 75.0000 mg | ORAL_TABLET | Freq: Every day | ORAL | 11 refills | Status: DC

## 2019-01-02 NOTE — Progress Notes (Signed)
NEUROLOGY FOLLOW UP OFFICE NOTE  Lawrence Owen [id number]  HISTORY OF PRESENT ILLNESS: Lawrence Owen is a 63 year old right-handed Caucasian man with hypertension, hyperlipidemia, hypothyroidism, carotid artery stenosis, tobacco and alcohol use and history of tongue cancer in 2002 with x-ray therapy to the neck and lymph node dissection who follows up for stroke.  She is accompanied by his wife who supplements history.  UPDATE: Current medications:  ASA 81mg , Lipitor 80mg , Plavix 75mg , Cozaar  Repeat carotid duplex from 12/11/18 showed progression with 80-99% stenosis in the right ICA and 60-79% stenosis in the left ICA with antegrade flow in both vertebrals.  Follow up CTA of neck on 12/31/18 was personally reviewed and confirmed high grade proximal right ICA stenosis (about 80%) as well as 55% left proximal ICA.  As his prior radiation therapy for tongue cancer may complicate potential carotid endarterectomy, Dr. Donnetta Hutching will be discussing possibility of other options with his colleagues, such as stenting.   He notes that after having the flu in January, he started feeling more off balance and often stubs his toe.  Previously, he was feeling dizzy.  He started taking losartan at night instead of the morning and now no longer dizzy.    HISTORY:  The patient was admitted to [facility] from 03/03/2018 to 03/04/2018 for stroke and headache.  He developed left-sided head pressure with left-sided facial numbness and unsteady gait with leaning towards the left side.  MRI of the brain revealed a 12 mm subacute infarction within the right caudate head and anterior limb of the internal capsule.  MRA of the head did not reveal aneurysm or large vessel occlusion or stenosis.  MRA of the neck revealed bilateral 60% moderate internal carotid artery stenosis.  2D echocardiogram with bubble study demonstrated EF 55-60% with no cardiac source of embolus.  He was found to have significant atheromatous  plaque with mildly dilated ascending aorta.  He was found to exhibit sinus bradycardia.  LDL was 134.  Hemoglobin A1c was 5.4.  He was discharged on aspirin 81 mg, Plavix 75 mg and Lipitor 80 mg daily.  He followed up with Dr. Donnetta Hutching of vascular surgery who did not recommend any interventional treatment of his right internal carotid artery stenosis at this time.  It is not clear if the carotid artery stenosis is due to atherosclerotic disease or possibly radiation therapy but most likely atherosclerotic disease.  He recommended monitoring and repeating carotid ultrasound to evaluate for any progression of stenosis.  He also reports that about a month prior to the stroke, he had brief episodes of altered vision in his right eye.  He would notice kaleidoscope vision and white out of vision.  There was no associated headache.  It occurred off and on for a week.  He does have a history of headaches.  PAST MEDICAL HISTORY: Past Medical History:  Diagnosis Date   Cancer of tongue [facility])     MEDICATIONS: Current Outpatient Medications on File Prior to Visit  Medication Sig Dispense Refill   acetaminophen (TYLENOL) 500 MG tablet Take 500 mg by mouth every 6 (six) hours as needed for mild pain or fever.     aspirin 81 MG chewable tablet Chew 1 tablet (81 mg total) by mouth daily. 30 tablet 0   atorvastatin (LIPITOR) 80 MG tablet Take 1 tablet (80 mg total) by mouth daily at 6 PM. 30 tablet 0   Cyanocobalamin (VITAMIN B-12 PO) Take 1 tablet by mouth daily.  fluticasone (FLONASE) 50 MCG/ACT nasal spray Place 2 sprays into both nostrils daily. 16 g 2   folic acid (FOLVITE) 1 MG tablet Take 1 tablet (1 mg total) by mouth daily. 30 tablet 0   levothyroxine (SYNTHROID, LEVOTHROID) 100 MCG tablet Take 100 mcg by mouth daily before breakfast.     losartan (COZAAR) 50 MG tablet Take 50 mg by mouth daily.     Multiple Vitamins-Minerals (EMERGEN-C IMMUNE PO) Take 1 Package by mouth daily.      thiamine 100 MG tablet Take 1 tablet (100 mg total) by mouth daily. 30 tablet 0   No current facility-administered medications on file prior to visit.     ALLERGIES: No Known Allergies  FAMILY HISTORY: Family History  Problem Relation Age of Onset   Emphysema Father     SOCIAL HISTORY: Social History   Socioeconomic History   Marital status: Single    Spouse name: Not on file   Number of children: 3   Years of education: Not on file   Highest education level: Not on file  Occupational History   Occupation: retired  Scientist, product/process development strain: Not on file   Food insecurity    Worry: Not on file    Inability: Not on Lexicographer needs    Medical: Not on file    Non-medical: Not on file  Tobacco Use   Smoking status: Current Every Day Smoker    Packs/day: 0.25    Years: 29.00    Pack years: 7.25    Types: Cigarettes   Smokeless tobacco: Never Used  Substance and Sexual Activity   Alcohol use: Yes    Alcohol/week: 21.0 standard drinks    Types: 21 Cans of beer per week    Comment: 0-3 beers per day   Drug use: Yes    Frequency: 2.0 times per week    Types: Marijuana   Sexual activity: Not on file  Lifestyle   Physical activity    Days per week: Not on file    Minutes per session: Not on file   Stress: Not on file  Relationships   Social connections    Talks on phone: Not on file    Gets together: Not on file    Attends religious service: Not on file    Active member of club or organization: Not on file    Attends meetings of clubs or organizations: Not on file    Relationship status: Not on file   Intimate partner violence    Fear of current or ex partner: Not on file    Emotionally abused: Not on file    Physically abused: Not on file    Forced sexual activity: Not on file  Other Topics Concern   Not on file  Social History Narrative   3 children   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  Has a degree in architectural engineering      Patient is right-handed. He lives with his long time girl-friend in a 2 story house, Restaurant manager, fast food on main level. He drinks 3-4 cups of coffee a day.    REVIEW OF SYSTEMS: Constitutional: No fevers, chills, or sweats, no generalized fatigue, change in appetite Eyes: No visual changes, double vision, eye pain Ear, nose and throat: No hearing loss, ear pain, nasal congestion, sore throat Cardiovascular: No chest pain, palpitations Respiratory:  No shortness of breath at rest  or with exertion, wheezes GastrointestinaI: No nausea, vomiting, diarrhea, abdominal pain, fecal incontinence Genitourinary:  No dysuria, urinary retention or frequency Musculoskeletal:  No neck pain, back pain Integumentary: No rash, pruritus, skin lesions Neurological: as above Psychiatric: No depression, insomnia, anxiety Endocrine: No palpitations, fatigue, diaphoresis, mood swings, change in appetite, change in weight, increased thirst Hematologic/Lymphatic:  No purpura, petechiae. Allergic/Immunologic: no itchy/runny eyes, nasal congestion, recent allergic reactions, rashes  PHYSICAL EXAM: Blood pressure 128/83, pulse 61, temperature 98.6 F (37 C), temperature source Oral, height 5\' 9"  (1.753 m), weight 141 lb (64 kg), SpO2 98 %. General: No acute distress.  Patient appears well-groomed.   Head:  Normocephalic/atraumatic Eyes:  Fundi examined but not visualized Neck: supple, no paraspinal tenderness, full range of motion Heart:  Regular rate and rhythm Lungs:  Clear to auscultation bilaterally Back: No paraspinal tenderness Neurological Exam: alert and oriented to person, place, and time. Attention span and concentration intact, recent and remote memory intact, fund of knowledge intact.  Speech fluent and not dysarthric, language intact.  CN II-XII intact. Bulk and tone normal, muscle strength 5/5 throughout.  Sensation  to light touch intact.  Deep tendon reflexes 2+ throughout, toes downgoing.  Finger to nose testing intact.  Gait mildly unsteady, Romberg negative.  IMPRESSION: 1.  Right subcortical infarct, likely secondary to small vessel disease 2.  Bilateral carotid artery disease with high-grade asymptomatic right ICA stenosis 3.  Hypertension 4.  Hyperlipidemia 5.  Tobacco use disorder 6.  Ocular migraine  Suspect the off balance likely residual stroke symptoms.  PLAN: 1.  Dual antiplatelet therapy 2.  Atorvastatin 80mg  daily.  LDL goal less than 70.  Check lipid panel today. 3.  Blood pressure control 4.  Follow up with Dr. Donnetta Hutching 5.  Tobacco cessation counseling (CPT 99406):  Tobacco use with history of stroke and cancer  - Currently smoking 1 to 5 cigarettes/day   - Patient was informed of the dangers of tobacco abuse including stroke, cancer, and MI, as well as benefits of tobacco cessation. - Patient is not willing to quit at this time. - Approximately 4 mins were spent counseling patient cessation techniques. We discussed various methods to help quit smoking, including deciding on a date to quit, joining a support group, pharmacological agents- nicotine gum/patch/lozenges, chantix.  - I will reassess his progress at the next follow-up visit 6.  Mediterranean diet 7.  Follow up in 6 months.  25 minutes spent face to face with patient, over 50% spent discussing management.  Metta Clines, DO  CC: Marda Stalker, PA-C

## 2019-01-02 NOTE — Telephone Encounter (Signed)
Attempted to call patient at only phone number given to instruct him regarding Plavix per protocol for pending TCAR surgery. No answer no voice mail.

## 2019-01-03 ENCOUNTER — Ambulatory Visit (INDEPENDENT_AMBULATORY_CARE_PROVIDER_SITE_OTHER): Payer: BC Managed Care – PPO | Admitting: Neurology

## 2019-01-03 ENCOUNTER — Other Ambulatory Visit: Payer: Self-pay

## 2019-01-03 ENCOUNTER — Other Ambulatory Visit (INDEPENDENT_AMBULATORY_CARE_PROVIDER_SITE_OTHER): Payer: BC Managed Care – PPO

## 2019-01-03 ENCOUNTER — Encounter: Payer: Self-pay | Admitting: Neurology

## 2019-01-03 VITALS — BP 128/83 | HR 61 | Temp 98.6°F | Ht 69.0 in | Wt 141.0 lb

## 2019-01-03 DIAGNOSIS — F17200 Nicotine dependence, unspecified, uncomplicated: Secondary | ICD-10-CM

## 2019-01-03 DIAGNOSIS — I6389 Other cerebral infarction: Secondary | ICD-10-CM

## 2019-01-03 DIAGNOSIS — E7800 Pure hypercholesterolemia, unspecified: Secondary | ICD-10-CM

## 2019-01-03 DIAGNOSIS — I10 Essential (primary) hypertension: Secondary | ICD-10-CM | POA: Diagnosis not present

## 2019-01-03 DIAGNOSIS — I6523 Occlusion and stenosis of bilateral carotid arteries: Secondary | ICD-10-CM

## 2019-01-03 DIAGNOSIS — G43109 Migraine with aura, not intractable, without status migrainosus: Secondary | ICD-10-CM

## 2019-01-03 NOTE — Patient Instructions (Addendum)
1.  Continue aspirin and Plavix. 2.  Continue atorvastatin.  Check lipid panel 3.  Continue blood pressure medication 4.  Follow up with Dr. Donnetta Hutching regarding potential surgery 5.  Try to work on quitting smoking 6.  Mediterranean diet  Your provider has requested that you have labwork completed today. Please go to [facility] ([id number]) on the second floor of this building before leaving the office today. You do not need to check in. If you are not called within 15 minutes please check with the front desk.   Mediterranean Diet A Mediterranean diet refers to food and lifestyle choices that are based on the traditions of countries located on the [employer]. This way of eating has been shown to help prevent certain conditions and improve outcomes for people who have chronic diseases, like kidney disease and heart disease. What are tips for following this plan? Lifestyle  Cook and eat meals together with your family, when possible.  Drink enough fluid to keep your urine clear or pale yellow.  Be physically active every day. This includes: ? Aerobic exercise like running or swimming. ? Leisure activities like gardening, walking, or housework.  Get 7-8 hours of sleep each night.  If recommended by your health care provider, drink red wine in moderation. This means 1 glass a day for nonpregnant women and 2 glasses a day for men. A glass of wine equals 5 oz (150 mL). Reading food labels   Check the serving size of packaged foods. For foods such as rice and pasta, the serving size refers to the amount of cooked product, not dry.  Check the total fat in packaged foods. Avoid foods that have saturated fat or trans fats.  Check the ingredients list for added sugars, such as corn syrup. Shopping  At the grocery store, buy most of your food from the areas near the walls of the store. This includes: ? Fresh fruits and vegetables (produce). ? Grains, beans, nuts, and seeds. Some  of these may be available in unpackaged forms or large amounts (in bulk). ? Fresh seafood. ? Poultry and eggs. ? Low-fat dairy products.  Buy whole ingredients instead of prepackaged foods.  Buy fresh fruits and vegetables in-season from local farmers markets.  Buy frozen fruits and vegetables in resealable bags.  If you do not have access to quality fresh seafood, buy precooked frozen shrimp or canned fish, such as tuna, salmon, or sardines.  Buy small amounts of raw or cooked vegetables, salads, or olives from the deli or salad bar at your store.  Stock your pantry so you always have certain foods on hand, such as olive oil, canned tuna, canned tomatoes, rice, pasta, and beans. Cooking  Cook foods with extra-virgin olive oil instead of using butter or other vegetable oils.  Have meat as a side dish, and have vegetables or grains as your main dish. This means having meat in small portions or adding small amounts of meat to foods like pasta or stew.  Use beans or vegetables instead of meat in common dishes like chili or lasagna.  Experiment with different cooking methods. Try roasting or broiling vegetables instead of steaming or sauteing them.  Add frozen vegetables to soups, stews, pasta, or rice.  Add nuts or seeds for added healthy fat at each meal. You can add these to yogurt, salads, or vegetable dishes.  Marinate fish or vegetables using olive oil, lemon juice, garlic, and fresh herbs. Meal planning   Plan to eat 1  vegetarian meal one day each week. Try to work up to 2 vegetarian meals, if possible.  Eat seafood 2 or more times a week.  Have healthy snacks readily available, such as: ? Vegetable sticks with hummus. ? [place] yogurt. ? Fruit and nut trail mix.  Eat balanced meals throughout the week. This includes: ? Fruit: 2-3 servings a day ? Vegetables: 4-5 servings a day ? Low-fat dairy: 2 servings a day ? Fish, poultry, or lean meat: 1 serving a day ? Beans  and legumes: 2 or more servings a week ? Nuts and seeds: 1-2 servings a day ? Whole grains: 6-8 servings a day ? Extra-virgin olive oil: 3-4 servings a day  Limit red meat and sweets to only a few servings a month What are my food choices?  Mediterranean diet ? Recommended  Grains: Whole-grain pasta. Brown rice. Bulgar wheat. Polenta. Couscous. Whole-wheat bread. [place].  Vegetables: Artichokes. Beets. Broccoli. Cabbage. Carrots. Eggplant. Green beans. Chard. Kale. Spinach. Onions. Leeks. Peas. Squash. Tomatoes. Peppers. Radishes.  Fruits: Apples. Apricots. Avocado. Berries. Bananas. Cherries. Dates. Figs. Grapes. Lemons. Melon. Oranges. Peaches. Plums. Pomegranate.  Meats and other protein foods: Beans. Almonds. Sunflower seeds. Pine nuts. Peanuts. [place]. Salmon. Scallops. Shrimp. [place]. Tilapia. Clams. Oysters. Eggs.  Dairy: Low-fat milk. Cheese. Greek yogurt.  Beverages: Water. Red wine. Herbal tea.  Fats and oils: Extra virgin olive oil. Avocado oil. Grape seed oil.  Sweets and desserts: [place] yogurt with honey. Baked apples. Poached pears. Trail mix.  Seasoning and other foods: Basil. Cilantro. Coriander. Cumin. Mint. Parsley. Sage. Rosemary. Tarragon. Garlic. Oregano. Thyme. Pepper. Balsalmic vinegar. Tahini. Hummus. Tomato sauce. Olives. Mushrooms. ? Limit these  Grains: Prepackaged pasta or rice dishes. Prepackaged cereal with added sugar.  Vegetables: Deep fried potatoes (french fries).  Fruits: Fruit canned in syrup.  Meats and other protein foods: Beef. Pork. Lamb. Poultry with skin. Hot dogs. Berniece Salines.  Dairy: Ice cream. Sour cream. Whole milk.  Beverages: Juice. Sugar-sweetened soft drinks. Beer. Liquor and spirits.  Fats and oils: Butter. Canola oil. Vegetable oil. Beef fat (tallow). Lard.  Sweets and desserts: Cookies. Cakes. Pies. Candy.  Seasoning and other foods: Mayonnaise. Premade sauces and marinades. The items listed may not be a complete list.  Talk with your dietitian about what dietary choices are right for you. Summary  The Mediterranean diet includes both food and lifestyle choices.  Eat a variety of fresh fruits and vegetables, beans, nuts, seeds, and whole grains.  Limit the amount of red meat and sweets that you eat.  Talk with your health care provider about whether it is safe for you to drink red wine in moderation. This means 1 glass a day for nonpregnant women and 2 glasses a day for men. A glass of wine equals 5 oz (150 mL). This information is not intended to replace advice given to you by your health care provider. Make sure you discuss any questions you have with your health care provider. Document Released: 01/07/2016 Document Revised: 01/14/2016 Document Reviewed: 01/07/2016 Elsevier Patient Education  2020 [employer].

## 2019-01-03 NOTE — Addendum Note (Signed)
Addended by: Clois Comber on: 01/03/2019 11:22 AM   Modules accepted: Orders

## 2019-01-04 LAB — LIPID PANEL
Cholesterol: 144 mg/dL (ref <200)
HDL: 62 mg/dL (ref >=40)
LDL Cholesterol (Calc): 68 mg/dL (calc)
Non-HDL Cholesterol (Calc): 82 mg/dL (calc) (ref <130)
Total CHOL/HDL Ratio: 2.3 (calc) (ref <5.0)
Triglycerides: 56 mg/dL (ref <150)

## 2019-01-14 ENCOUNTER — Telehealth (HOSPITAL_COMMUNITY): Payer: Self-pay | Admitting: Rehabilitation

## 2019-01-14 NOTE — Telephone Encounter (Signed)

## 2019-01-15 ENCOUNTER — Encounter: Payer: Self-pay | Admitting: Vascular Surgery

## 2019-01-15 ENCOUNTER — Other Ambulatory Visit: Payer: Self-pay

## 2019-01-15 ENCOUNTER — Ambulatory Visit (INDEPENDENT_AMBULATORY_CARE_PROVIDER_SITE_OTHER): Payer: BC Managed Care – PPO | Admitting: Vascular Surgery

## 2019-01-15 ENCOUNTER — Other Ambulatory Visit: Payer: Self-pay | Admitting: *Deleted

## 2019-01-15 ENCOUNTER — Encounter: Payer: Self-pay | Admitting: *Deleted

## 2019-01-15 VITALS — BP 144/97 | HR 70 | Temp 97.5°F | Resp 16 | Ht 69.0 in | Wt 137.0 lb

## 2019-01-15 DIAGNOSIS — I6523 Occlusion and stenosis of bilateral carotid arteries: Secondary | ICD-10-CM | POA: Diagnosis not present

## 2019-01-15 DIAGNOSIS — I6529 Occlusion and stenosis of unspecified carotid artery: Secondary | ICD-10-CM | POA: Insufficient documentation

## 2019-01-15 NOTE — Progress Notes (Signed)
Patient name: Lawrence Owen MRN: [id number] DOB: Jan 17, 1956 Sex: male  REASON FOR CONSULT: High-grade right ICA stenosis in setting of previous right neck dissection and radiation for tongue cancer  HPI: Lawrence Owen is a 63 y.o. male, with history of tongue cancer that presents to discuss possible right TCAR.  He has been followed by Dr. Donnetta Hutching for his carotid stenosis.  As previously noted he had a posterior circulation stroke 8 months ago and at that time work-up revealed asymptomatic bilateral 60% carotid stenosis.  He was seen in our office for follow-up and found to have progression of his right carotid stenosis.  He also had a CTA neck done that showed high-grade stenosis on the right.  He was felt to be a poor candidate for carotid endarterectomy given his previous head and neck dissection as well as radiation.  He presents today to discuss possible TCAR.  He reports no new neurologic deficits.  He is on aspirin Plavix and statin.  His right neck dissection/radiation was in 2002 for tongue cancer.  Past Medical History:  Diagnosis Date  . Cancer of tongue [facility])     Past Surgical History:  Procedure Laterality Date  . NECK SURGERY  2003   lymph node removed    Family History  Problem Relation Age of Onset  . Emphysema Father     SOCIAL HISTORY: Social History   Socioeconomic History  . Marital status: Single    Spouse name: Neoma Laming  . Number of children: 3  . Years of education: 16  . Highest education level: Bachelor's degree (e.g., BA, AB, BS)  Occupational History  . Occupation: retired  Scientific laboratory technician  . Financial resource strain: Not on file  . Food insecurity    Worry: Not on file    Inability: Not on file  . Transportation needs    Medical: Not on file    Non-medical: Not on file  Tobacco Use  . Smoking status: Current Every Day Smoker    Packs/day: 0.25    Years: 29.00    Pack years: 7.25    Types: Cigarettes  . Smokeless tobacco: Never Used   Substance and Sexual Activity  . Alcohol use: Yes    Alcohol/week: 21.0 standard drinks    Types: 21 Cans of beer per week    Comment: 0-3 beers per day  . Drug use: Yes    Frequency: 2.0 times per week    Types: Marijuana  . Sexual activity: Not on file  Lifestyle  . Physical activity    Days per week: Not on file    Minutes per session: Not on file  . Stress: Not on file  Relationships  . Social Herbalist on phone: Not on file    Gets together: Not on file    Attends religious service: Not on file    Active member of club or organization: Not on file    Attends meetings of clubs or organizations: Not on file    Relationship status: Not on file  . Intimate partner violence    Fear of current or ex partner: Not on file    Emotionally abused: Not on file    Physically abused: Not on file    Forced sexual activity: Not on file  Other Topics Concern  . 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  Has a degree in architectural engineering      Patient is right-handed. He lives with his long time girl-friend in a 2 story house, Restaurant manager, fast food on main level. He drinks 3-4 cups of coffee a day.    No Known Allergies  Current Outpatient Medications  Medication Sig Dispense Refill  . acetaminophen (TYLENOL) 500 MG tablet Take 500 mg by mouth every 6 (six) hours as needed for mild pain or fever.    Marland Kitchen aspirin 81 MG chewable tablet Chew 1 tablet (81 mg total) by mouth daily. 30 tablet 0  . atorvastatin (LIPITOR) 80 MG tablet Take 1 tablet (80 mg total) by mouth daily at 6 PM. 30 tablet 0  . clopidogrel (PLAVIX) 75 MG tablet Take 1 tablet (75 mg total) by mouth daily. 30 tablet 11  . Cyanocobalamin (VITAMIN B-12 PO) Take 1 tablet by mouth daily.    . fluticasone (FLONASE) 50 MCG/ACT nasal spray Place 2 sprays into both nostrils daily. 16 g 2  . folic acid  (FOLVITE) 1 MG tablet Take 1 tablet (1 mg total) by mouth daily. 30 tablet 0  . levothyroxine (SYNTHROID, LEVOTHROID) 100 MCG tablet Take 100 mcg by mouth daily before breakfast.    . losartan (COZAAR) 50 MG tablet Take 50 mg by mouth daily.    . Multiple Vitamins-Minerals (EMERGEN-C IMMUNE PO) Take 1 Package by mouth daily.    Marland Kitchen thiamine 100 MG tablet Take 1 tablet (100 mg total) by mouth daily. 30 tablet 0  . TURMERIC PO Take by mouth.     No current facility-administered medications for this visit.     REVIEW OF SYSTEMS:  [X]  denotes positive finding, [ ]  denotes negative finding Cardiac  Comments:  Chest pain or chest pressure:    Shortness of breath upon exertion:    Short of breath when lying flat:    Irregular heart rhythm:        Vascular    Pain in calf, thigh, or hip brought on by ambulation:    Pain in feet at night that wakes you up from your sleep:     Blood clot in your veins:    Leg swelling:         Pulmonary    Oxygen at home:    Productive cough:     Wheezing:         Neurologic    Sudden weakness in arms or legs:     Sudden numbness in arms or legs:     Sudden onset of difficulty speaking or slurred speech:    Temporary loss of vision in one eye:     Problems with dizziness:         Gastrointestinal    Blood in stool:     Vomited blood:         Genitourinary    Burning when urinating:     Blood in urine:        Psychiatric    Major depression:         Hematologic    Bleeding problems:    Problems with blood clotting too easily:        Skin    Rashes or ulcers:        Constitutional    Fever or chills:      PHYSICAL EXAM: Vitals:   01/15/19 0834  BP: (!) 144/97  Pulse: 70  Resp: 16  Temp: (!) 97.5 F (36.4 C)  TempSrc: Temporal  SpO2: 99%  Weight: 137 lb (62.1 kg)  Height: 5\' 9"  (1.753 m)    GENERAL: The patient is a well-nourished male, in no acute distress. The vital signs are documented above. CARDIOVASCULAR: Prior right  neck dissection with extensive surgery on the right neck.  2+ carotid pulse at base of right neck just above clavicle PULMONARY: There is good air exchange  MUSCULOSKELETAL: There are no major deformities or cyanosis. NEUROLOGIC: No focal weakness or paresthesias are detected. SKIN: There are no ulcers or rashes noted. PSYCHIATRIC: The patient has a normal affect.  DATA:   I independently reviewed his CTA neck and this shows a focal greater than 80% stenosis of the proximal right ICA.  The left ICA has a 50 to 55% stenosis.  Assessment/Plan:  63 year old male with history of tongue cancer that previously underwent right neck dissection with radiation that presents with asymptomatic high-grade right ICA stenosis.  Discussed plan for TCAR with stent placement given hostile neck.  This will require small incision at the base of the calvicle.  The details of the operation were discussed as well as risks and benefits.  I did discuss 1% risk of perioperative stroke.  He will need to stay on his aspirin Plavix and statin in the perioperative period.  He is scheduled for 01/23/2019.  Discussed if neck too scarred for sheath placement, only other option would be transfemoral stent placement with 6% risk of perioperative stroke.   Marty Heck, MD [facility] [place] Office: [phone] Pager: [phone]

## 2019-01-18 ENCOUNTER — Telehealth: Payer: Self-pay

## 2019-01-18 NOTE — Telephone Encounter (Signed)
-----   Message from Pieter Partridge, DO sent at 01/04/2019  8:25 AM EDT ----- Cholesterol looks good.  No change to the atorvastatin.

## 2019-01-18 NOTE — Telephone Encounter (Signed)
Notified patient on voicemail of labs

## 2019-01-21 ENCOUNTER — Telehealth: Payer: Self-pay

## 2019-01-21 ENCOUNTER — Ambulatory Visit: Payer: BLUE CROSS/BLUE SHIELD | Admitting: Neurology

## 2019-01-21 NOTE — Telephone Encounter (Signed)
Patients wife called and said that she wanted to know if he could still have surgery if he possibly had a TIA this weekend. She said that he was really dizzy, hard to understand and not himself. I asked if he went anywhere to be evaluated and she said no. She said that they are tired of going to [facility] and sitting hours with nothing being done. Expressed the importance of time in treating someone who has had a stroke. She said that she understands but they arent going anywhere.   Spoke with Lawrence Owen and she said that pt can still proceed with surgery as they will do test before on him to ensure he is ok. He has preadmit testing tomorrow.   Advised wife if his symptoms get worse to take him to the ER for evaluation. Otherwise they will keep appt tomorrow for preadmit testing and Wednesday for surgery.   York Cerise, CMA

## 2019-01-21 NOTE — Progress Notes (Signed)
[place], [place] - [id number] [place] [place] [address] Phone: [phone] Fax: [phone]      Your procedure is scheduled on August 26  Report to [facility] Main Entrance "A" at 1040 A.M., and check in at the Admitting office.  Call this number if you have problems the morning of surgery:  [phone]  Call [phone] if you have any questions prior to your surgery date Monday-Friday 8am-4pm    Remember:  Do not eat or drink after midnight the night before your surgery    Take these medicines the morning of surgery with A SIP OF WATER  atorvastatin (LIPITOR) levothyroxine (SYNTHROID, LEVOTHROID) Eye drop if needed  Follow your surgeon's instructions on when to stop Aspirin and Plavix.  If no instructions were given by your surgeon then you will need to call the office to get those instructions.    7 days prior to surgery STOP taking any Aspirin (unless otherwise instructed by your surgeon), Aleve, Naproxen, Ibuprofen, Motrin, Advil, Goody's, BC's, all herbal medications, fish oil, and all vitamins.    The Morning of Surgery  Do not wear jewelry  Do not wear lotions, powders, or colognes, or deodorant  Men may shave face and neck.  Do not bring valuables to the hospital.  [facility] is not responsible for any belongings or valuables.  If you are a smoker, DO NOT Smoke 24 hours prior to surgery IF you wear a CPAP at night please bring your mask, tubing, and machine the morning of surgery   Remember that you must have someone to transport you home after your surgery, and remain with you for 24 hours if you are discharged the same day.   Contacts, glasses, hearing aids, dentures or bridgework may not be worn into surgery.    Leave your suitcase in the car.  After surgery it may be brought to your room.  For patients admitted to the hospital, discharge time will be determined by your treatment  team.  Patients discharged the day of surgery will not be allowed to drive home.    Special instructions:   [place]- Preparing For Surgery  Before surgery, you can play an important role. Because skin is not sterile, your skin needs to be as free of germs as possible. You can reduce the number of germs on your skin by washing with CHG (chlorahexidine gluconate) Soap before surgery.  CHG is an antiseptic cleaner which kills germs and bonds with the skin to continue killing germs even after washing.    Oral Hygiene is also important to reduce your risk of infection.  Remember - BRUSH YOUR TEETH THE MORNING OF SURGERY WITH YOUR REGULAR TOOTHPASTE  Please do not use if you have an allergy to CHG or antibacterial soaps. If your skin becomes reddened/irritated stop using the CHG.  Do not shave (including legs and underarms) for at least 48 hours prior to first CHG shower. It is OK to shave your face.  Please follow these instructions carefully.   1. Shower the NIGHT BEFORE SURGERY and the MORNING OF SURGERY with CHG Soap.   2. If you chose to wash your hair, wash your hair first as usual with your normal shampoo.  3. After you shampoo, rinse your hair and body thoroughly to remove the shampoo.  4. Use CHG as you would any other liquid soap. You can apply CHG directly to the skin and wash gently with a scrungie or a  clean washcloth.   5. Apply the CHG Soap to your body ONLY FROM THE NECK DOWN.  Do not use on open wounds or open sores. Avoid contact with your eyes, ears, mouth and genitals (private parts). Wash Face and genitals (private parts)  with your normal soap.   6. Wash thoroughly, paying special attention to the area where your surgery will be performed.  7. Thoroughly rinse your body with warm water from the neck down.  8. DO NOT shower/wash with your normal soap after using and rinsing off the CHG Soap.  9. Pat yourself dry with a CLEAN TOWEL.  10. Wear CLEAN PAJAMAS to bed  the night before surgery, wear comfortable clothes the morning of surgery  11. Place CLEAN SHEETS on your bed the night of your first shower and DO NOT SLEEP WITH PETS.    Day of Surgery:  Do not apply any deodorants/lotions. Please shower the morning of surgery with the CHG soap  Please wear clean clothes to the hospital/surgery center.   Remember to brush your teeth WITH YOUR REGULAR TOOTHPASTE.   Please read over the following fact sheets that you were given.

## 2019-01-22 ENCOUNTER — Other Ambulatory Visit (HOSPITAL_COMMUNITY)
Admission: RE | Admit: 2019-01-22 | Discharge: 2019-01-22 | Disposition: A | Discharge status: Home / Self Care | Payer: BC Managed Care – PPO | Source: Ambulatory Visit | Attending: Vascular Surgery | Admitting: Vascular Surgery

## 2019-01-22 ENCOUNTER — Encounter (HOSPITAL_COMMUNITY)
Admission: RE | Admit: 2019-01-22 | Discharge: 2019-01-22 | Disposition: A | Discharge status: Home / Self Care | Payer: BC Managed Care – PPO | Source: Ambulatory Visit | Attending: Vascular Surgery | Admitting: Vascular Surgery

## 2019-01-22 ENCOUNTER — Encounter (HOSPITAL_COMMUNITY): Payer: Self-pay

## 2019-01-22 ENCOUNTER — Other Ambulatory Visit: Payer: Self-pay

## 2019-01-22 DIAGNOSIS — Z01812 Encounter for preprocedural laboratory examination: Secondary | ICD-10-CM | POA: Insufficient documentation

## 2019-01-22 DIAGNOSIS — Z20828 Contact with and (suspected) exposure to other viral communicable diseases: Secondary | ICD-10-CM | POA: Insufficient documentation

## 2019-01-22 HISTORY — DX: Paresthesia of skin: R20.2

## 2019-01-22 HISTORY — DX: Post-traumatic stress disorder, unspecified: F43.10

## 2019-01-22 HISTORY — DX: Anxiety disorder, unspecified: F41.9

## 2019-01-22 HISTORY — DX: Hypothyroidism, unspecified: E03.9

## 2019-01-22 HISTORY — DX: Obsessive-compulsive disorder, unspecified: F42.9

## 2019-01-22 HISTORY — DX: Cerebral infarction, unspecified: I63.9

## 2019-01-22 HISTORY — DX: Chronic obstructive pulmonary disease, unspecified: J44.9

## 2019-01-22 HISTORY — DX: Essential (primary) hypertension: I10

## 2019-01-22 LAB — CBC
HCT: 47.3 % (ref 39.0–52.0)
Hemoglobin: 15.6 g/dL (ref 13.0–17.0)
MCH: 31.5 pg (ref 26.0–34.0)
MCHC: 33 g/dL (ref 30.0–36.0)
MCV: 95.4 fL (ref 80.0–100.0)
Platelets: 165 10*3/uL (ref 150–400)
RBC: 4.96 MIL/uL (ref 4.22–5.81)
RDW: 12.7 % (ref 11.5–15.5)
WBC: 5.7 10*3/uL (ref 4.0–10.5)
nRBC: 0 % (ref 0.0–0.2)

## 2019-01-22 LAB — COMPREHENSIVE METABOLIC PANEL
ALT: 27 U/L (ref 0–44)
AST: 29 U/L (ref 15–41)
Albumin: 4.4 g/dL (ref 3.5–5.0)
Alkaline Phosphatase: 59 U/L (ref 38–126)
Anion gap: 7 (ref 5–15)
BUN: 10 mg/dL (ref 8–23)
CO2: 26 mmol/L (ref 22–32)
Calcium: 9.1 mg/dL (ref 8.9–10.3)
Chloride: 105 mmol/L (ref 98–111)
Creatinine, Ser: 1.07 mg/dL (ref 0.61–1.24)
GFR calc Af Amer: >60 mL/min (ref >60)
GFR calc non Af Amer: >60 mL/min (ref >60)
Glucose, Bld: 94 mg/dL (ref 70–99)
Potassium: 4.2 mmol/L (ref 3.5–5.1)
Sodium: 138 mmol/L (ref 135–145)
Total Bilirubin: 1 mg/dL (ref 0.3–1.2)
Total Protein: 6.9 g/dL (ref 6.5–8.1)

## 2019-01-22 LAB — TYPE AND SCREEN
ABO/RH(D): O POS
Antibody Screen: NEGATIVE

## 2019-01-22 LAB — URINALYSIS, ROUTINE W REFLEX MICROSCOPIC
Bilirubin Urine: NEGATIVE
Glucose, UA: NEGATIVE mg/dL
Hgb urine dipstick: NEGATIVE
Ketones, ur: NEGATIVE mg/dL
Leukocytes,Ua: NEGATIVE
Nitrite: NEGATIVE
Protein, ur: NEGATIVE mg/dL
Specific Gravity, Urine: 1.012 (ref 1.005–1.030)
pH: 6 (ref 5.0–8.0)

## 2019-01-22 LAB — SURGICAL PCR SCREEN
MRSA, PCR: NEGATIVE
Staphylococcus aureus: NEGATIVE

## 2019-01-22 LAB — ABO/RH: ABO/RH(D): O POS

## 2019-01-22 LAB — PROTIME-INR
INR: 1.1 (ref 0.8–1.2)
Prothrombin Time: 14.2 seconds (ref 11.4–15.2)

## 2019-01-22 LAB — SARS CORONAVIRUS 2 (TAT 6-24 HRS): SARS Coronavirus 2: NEGATIVE

## 2019-01-22 LAB — APTT: aPTT: 38 seconds — ABNORMAL HIGH (ref 24–36)

## 2019-01-22 NOTE — Progress Notes (Signed)
PCP - Lake Bells, PA-C with [facility] , [place]. Cardiologist -   Chest x-ray - na  EKG - 03/03/2018  Stress Test - no  ECHO - 2019  Cardiac Cath - no  Sleep Study - no CPAP - na  ERAS-no  HA1C-na Fasting Blood Sugar - no Checks Blood Sugar ___na__ times a day  Anesthesia-  Pt denies having chest pain, sob, or fever at this time. All instructions explained to the pt, with a verbal understanding of the material. Pt agrees to go over the instructions while at home for a better understanding. Pt also instructed to self quarantine after being tested for COVID-19. The opportunity to ask questions was provided.

## 2019-01-22 NOTE — Progress Notes (Signed)
Your procedure is scheduled on August 26  Report to [facility] Main Entrance "A" at 1040 A.M., and check in at the Admitting office.            Your surgery or procedure is scheduled for 1240 PM  Call this number if you have problems the morning of surgery:  [phone]   Remember:  Do not eat or drink after midnight the night before your surgery    Take these medicines the morning of surgery with A SIP OF WATER  atorvastatin (LIPITOR) levothyroxine (SYNTHROID, LEVOTHROID) Eye drop if needed  Follow your surgeon's instructions on when to stop Aspirin and Plavix.  If no instructions were given by your surgeon then you will need to call the office to get those instructions.    7 days prior to surgery STOP taking any Aspirin (unless otherwise instructed by your surgeon), Aleve, Naproxen, Ibuprofen, Motrin, Advil, Goody's, BC's, all herbal medications, fish oil, and all vitamins.    The Morning of Surgery  Do not wear jewelry  Do not wear lotions, powders, or colognes, or deodorant  Men may shave face and neck.  Do not bring valuables to the hospital.  [facility] is not responsible for any belongings or valuables.  If you are a smoker, DO NOT Smoke 24 hours prior to surgery IF you wear a CPAP at night please bring your mask, tubing, and machine the morning of surgery   Remember that you must have someone to transport you home after your surgery, and remain with you for 24 hours if you are discharged the same day.   Contacts, glasses, hearing aids, dentures or bridgework may not be worn into surgery.    Leave your suitcase in the car.  After surgery it may be brought to your room.  For patients admitted to the hospital, discharge time will be determined by your treatment team.  Patients discharged the day of surgery will not be allowed to drive home.    Special instructions:   [place]- Preparing For Surgery  Before surgery, you can play an important role. Because  skin is not sterile, your skin needs to be as free of germs as possible. You can reduce the number of germs on your skin by washing with CHG (chlorahexidine gluconate) Soap before surgery.  CHG is an antiseptic cleaner which kills germs and bonds with the skin to continue killing germs even after washing.    Oral Hygiene is also important to reduce your risk of infection.  Remember - BRUSH YOUR TEETH THE MORNING OF SURGERY WITH YOUR REGULAR TOOTHPASTE  Please do not use if you have an allergy to CHG or antibacterial soaps. If your skin becomes reddened/irritated stop using the CHG.  Do not shave (including legs and underarms) for at least 48 hours prior to first CHG shower. It is OK to shave your face.  Please follow these instructions carefully.   1. Shower the NIGHT BEFORE SURGERY and the MORNING OF SURGERY with CHG Soap.   2. If you chose to wash your hair, wash your hair first as usual with your normal shampoo.  3. After you shampoo, wash your face and private area with the soap you use at home, then rinse your hair and body thoroughly to remove the shampoo and soap.  4. Use CHG as you would any other liquid soap. You can apply CHG directly to the skin and wash gently with a scrungie or a clean washcloth.  5. Apply the CHG Soap to your body ONLY FROM THE NECK DOWN.  Do not use on open wounds or open sores. Avoid contact with your eyes, ears, mouth and genitals (private parts). Wash Face and genitals (private parts)  with your normal soap.   6. Wash thoroughly, paying special attention to the area where your surgery will be performed.  7. Thoroughly rinse your body with warm water from the neck down.  8. DO NOT shower/wash with your normal soap after using and rinsing off the CHG Soap.  9. Pat yourself dry with a CLEAN TOWEL.  10. Wear CLEAN PAJAMAS to bed the night before surgery, wear comfortable clothes the morning of surgery  11. Place CLEAN SHEETS on your bed the night of your  first shower and DO NOT SLEEP WITH PETS.  The Morning of Surgery             Shower as instructed above.            Do not wear lotions, powders, or colognes, or deodorant  Please wear clean clothes to the hospital/surgery center.   Remember to brush your teeth WITH YOUR REGULAR TOOTHPASTE.   Do not wear jewelry    Men may shave face and neck.  Do not bring valuables to the hospital.  [facility] is not responsible for any belongings or valuables.  If you are a smoker, DO NOT Smoke 24 hours prior to surgery IF you wear a CPAP at night please bring your mask, tubing, and machine the morning of surgery   Remember that you must have someone to transport you home after your surgery, and remain with you for 24 hours if you are discharged the same day.   Contacts, glasses, hearing aids, dentures or bridgework may not be worn into surgery.    For patients admitted to the hospital, discharge time will be determined by your treatment team.  Please read over the following fact sheets that you were given.

## 2019-01-23 ENCOUNTER — Inpatient Hospital Stay (HOSPITAL_COMMUNITY): Payer: BC Managed Care – PPO | Admitting: Certified Registered Nurse Anesthetist

## 2019-01-23 ENCOUNTER — Encounter (HOSPITAL_COMMUNITY): Payer: Self-pay

## 2019-01-23 ENCOUNTER — Ambulatory Visit (HOSPITAL_COMMUNITY)
Admission: RE | Admit: 2019-01-23 | Discharge: 2019-01-23 | Disposition: A | Discharge status: Home / Self Care | Payer: BC Managed Care – PPO | Attending: Vascular Surgery | Admitting: Vascular Surgery

## 2019-01-23 ENCOUNTER — Other Ambulatory Visit: Payer: Self-pay

## 2019-01-23 ENCOUNTER — Encounter (HOSPITAL_COMMUNITY)
Admission: RE | Disposition: A | Discharge status: Home / Self Care | Payer: Self-pay | Source: Home / Self Care | Attending: Vascular Surgery

## 2019-01-23 DIAGNOSIS — Z8673 Personal history of transient ischemic attack (TIA), and cerebral infarction without residual deficits: Secondary | ICD-10-CM | POA: Insufficient documentation

## 2019-01-23 DIAGNOSIS — Z7902 Long term (current) use of antithrombotics/antiplatelets: Secondary | ICD-10-CM | POA: Insufficient documentation

## 2019-01-23 DIAGNOSIS — Z539 Procedure and treatment not carried out, unspecified reason: Secondary | ICD-10-CM | POA: Insufficient documentation

## 2019-01-23 DIAGNOSIS — F17210 Nicotine dependence, cigarettes, uncomplicated: Secondary | ICD-10-CM | POA: Diagnosis not present

## 2019-01-23 DIAGNOSIS — Z79899 Other long term (current) drug therapy: Secondary | ICD-10-CM | POA: Insufficient documentation

## 2019-01-23 DIAGNOSIS — Z7982 Long term (current) use of aspirin: Secondary | ICD-10-CM | POA: Diagnosis not present

## 2019-01-23 DIAGNOSIS — I6523 Occlusion and stenosis of bilateral carotid arteries: Secondary | ICD-10-CM | POA: Insufficient documentation

## 2019-01-23 DIAGNOSIS — Z85810 Personal history of malignant neoplasm of tongue: Secondary | ICD-10-CM | POA: Insufficient documentation

## 2019-01-23 SURGERY — TRANSCAROTID ARTERY REVASCULARIZATION (TCAR)
Anesthesia: General

## 2019-01-23 MED ORDER — SODIUM CHLORIDE 0.9 % IV SOLN: INTRAVENOUS | Status: DC

## 2019-01-23 MED ORDER — CEFAZOLIN SODIUM-DEXTROSE 2-4 GM/100ML-% IV SOLN
INTRAVENOUS | Status: AC
  Filled 2019-01-23: qty 100

## 2019-01-23 MED ORDER — FENTANYL CITRATE (PF) 250 MCG/5ML IJ SOLN
INTRAMUSCULAR | Status: AC
  Filled 2019-01-23: qty 5

## 2019-01-23 MED ORDER — PROPOFOL 10 MG/ML IV BOLUS
INTRAVENOUS | Status: AC
  Filled 2019-01-23: qty 20

## 2019-01-23 MED ORDER — LACTATED RINGERS IV SOLN: INTRAVENOUS | Status: DC

## 2019-01-23 MED ORDER — CHLORHEXIDINE GLUCONATE CLOTH 2 % EX PADS: 6.0000 | MEDICATED_PAD | Freq: Once | CUTANEOUS | Status: DC

## 2019-01-23 MED ORDER — CEFAZOLIN SODIUM-DEXTROSE 2-4 GM/100ML-% IV SOLN: 2.0000 g | INTRAVENOUS | Status: DC

## 2019-01-23 NOTE — Progress Notes (Signed)
Patient discharge home with significant other.  IV removed with catheter intact.  Patient left short stay ambulating, escorted by NT.

## 2019-01-23 NOTE — Progress Notes (Signed)
Patient scheduled for right TCAR today for asymptomatic high-grade stenosis.  On arrival blood pressure significantly elevated after multiple readings including 227/127 and 229/137.  Patient denies any chest pain shortness of breath etc.  States he did take all of his blood pressure medicine today.  Discussed that given this is an asymptomatic lesion I think surgical intervention is too high risk given that we will likely have difficult time controlling his blood pressure postop particularly after manipulation of his baroreceptors with balloon angioplasty and stent placement.  Would be high risk for  Hemorrhagic stroke as well.  I have offered to arrange follow-up with his primary care doctor but he states he can do this on his own and will call today.  Once his blood pressure is controlled I gave him our office number as well as my triage nurse information and discussed that he contact us to get rescheduled when we think it is safe to proceed.  Anesthesia completely agrees and does not think he safe for anesthesia today.  Marty Heck, MD [facility] Office: [phone] Pager: [place]

## 2019-01-23 NOTE — Progress Notes (Signed)
Dr. Glennon Mac notified patient had coffee with cream and sugar as well as elevated BP upon arrival to short stay.  She will come see the patient and talk with Dr. Carlis Abbott.  No new orders received.  Will continue to monitor the patient.

## 2019-01-23 NOTE — Progress Notes (Signed)
Per Dr. Glennon Mac and Dr. Carlis Abbott, surgery is being cancelled.  Patient is asymptomatic so Dr. Carlis Abbott is ok with patient to follow up with PCP regarding elevated BP.  This RN will write down BPs for patient to have when he goes for follow up.

## 2019-01-23 NOTE — H&P (Signed)
History and Physical Interval Note:  01/23/2019 11:29 AM  Lawrence Owen  has presented today for surgery, with the diagnosis of right carotid stenosis.  The various methods of treatment have been discussed with the patient and family. After consideration of risks, benefits and other options for treatment, the patient has consented to  Procedure(s): TRANSCAROTID ARTERY REVASCULARIZATION (Right) as a surgical intervention.  The patient's history has been reviewed, patient examined, no change in status, stable for surgery.  I have reviewed the patient's chart and labs.  Questions were answered to the patient's satisfaction.    Right TCAR  Marty Heck  Patient name: Lawrence Owen           MRN: [id number]        DOB: 1955-08-19          Sex: male  REASON FOR CONSULT: High-grade right ICA stenosis in setting of previous right neck dissection and radiation for tongue cancer  HPI: Lawrence Owen is a 63 y.o. male, with history of tongue cancer that presents to discuss possible right TCAR.  He has been followed by Dr. Donnetta Hutching for his carotid stenosis.  As previously noted he had a posterior circulation stroke 8 months ago and at that time work-up revealed asymptomatic bilateral 60% carotid stenosis.  He was seen in our office for follow-up and found to have progression of his right carotid stenosis.  He also had a CTA neck done that showed high-grade stenosis on the right.  He was felt to be a poor candidate for carotid endarterectomy given his previous head and neck dissection as well as radiation.  He presents today to discuss possible TCAR.  He reports no new neurologic deficits.  He is on aspirin Plavix and statin.  His right neck dissection/radiation was in 2002 for tongue cancer.      Past Medical History:  Diagnosis Date  . Cancer of tongue [facility])          Past Surgical History:  Procedure Laterality Date  . NECK SURGERY  2003   lymph node removed         Family History   Problem Relation Age of Onset  . Emphysema Father     SOCIAL HISTORY: Social History        Socioeconomic History  . Marital status: Single    Spouse name: Neoma Laming  . Number of children: 3  . Years of education: 16  . Highest education level: Bachelor's degree (e.g., BA, AB, BS)  Occupational History  . Occupation: retired  Scientific laboratory technician  . Financial resource strain: Not on file  . Food insecurity    Worry: Not on file    Inability: Not on file  . Transportation needs    Medical: Not on file    Non-medical: Not on file  Tobacco Use  . Smoking status: Current Every Day Smoker    Packs/day: 0.25    Years: 29.00    Pack years: 7.25    Types: Cigarettes  . Smokeless tobacco: Never Used  Substance and Sexual Activity  . Alcohol use: Yes    Alcohol/week: 21.0 standard drinks    Types: 21 Cans of beer per week    Comment: 0-3 beers per day  . Drug use: Yes    Frequency: 2.0 times per week    Types: Marijuana  . Sexual activity: Not on file  Lifestyle  . Physical activity    Days per week: Not on file  Minutes per session: Not on file  . Stress: Not on file  Relationships  . Social Herbalist on phone: Not on file    Gets together: Not on file    Attends religious service: Not on file    Active member of club or organization: Not on file    Attends meetings of clubs or organizations: Not on file    Relationship status: Not on file  . Intimate partner violence    Fear of current or ex partner: Not on file    Emotionally abused: Not on file    Physically abused: Not on file    Forced sexual activity: Not on file  Other Topics Concern  . 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  Has a degree in architectural engineering      Patient is right-handed. He lives with his long  time girl-friend in a 2 story house, Restaurant manager, fast food on main level. He drinks 3-4 cups of coffee a day.    No Known Allergies        Current Outpatient Medications  Medication Sig Dispense Refill  . acetaminophen (TYLENOL) 500 MG tablet Take 500 mg by mouth every 6 (six) hours as needed for mild pain or fever.    Marland Kitchen aspirin 81 MG chewable tablet Chew 1 tablet (81 mg total) by mouth daily. 30 tablet 0  . atorvastatin (LIPITOR) 80 MG tablet Take 1 tablet (80 mg total) by mouth daily at 6 PM. 30 tablet 0  . clopidogrel (PLAVIX) 75 MG tablet Take 1 tablet (75 mg total) by mouth daily. 30 tablet 11  . Cyanocobalamin (VITAMIN B-12 PO) Take 1 tablet by mouth daily.    . fluticasone (FLONASE) 50 MCG/ACT nasal spray Place 2 sprays into both nostrils daily. 16 g 2  . folic acid (FOLVITE) 1 MG tablet Take 1 tablet (1 mg total) by mouth daily. 30 tablet 0  . levothyroxine (SYNTHROID, LEVOTHROID) 100 MCG tablet Take 100 mcg by mouth daily before breakfast.    . losartan (COZAAR) 50 MG tablet Take 50 mg by mouth daily.    . Multiple Vitamins-Minerals (EMERGEN-C IMMUNE PO) Take 1 Package by mouth daily.    Marland Kitchen thiamine 100 MG tablet Take 1 tablet (100 mg total) by mouth daily. 30 tablet 0  . TURMERIC PO Take by mouth.     No current facility-administered medications for this visit.     REVIEW OF SYSTEMS:  [X]  denotes positive finding, [ ]  denotes negative finding Cardiac  Comments:  Chest pain or chest pressure:    Shortness of breath upon exertion:    Short of breath when lying flat:    Irregular heart rhythm:        Vascular    Pain in calf, thigh, or hip brought on by ambulation:    Pain in feet at night that wakes you up from your sleep:     Blood clot in your veins:    Leg swelling:         Pulmonary    Oxygen at home:    Productive cough:     Wheezing:         Neurologic    Sudden weakness in arms or legs:     Sudden numbness in arms or  legs:     Sudden onset of difficulty speaking or slurred  speech:    Temporary loss of vision in one eye:     Problems with dizziness:         Gastrointestinal    Blood in stool:     Vomited blood:         Genitourinary    Burning when urinating:     Blood in urine:        Psychiatric    Major depression:         Hematologic    Bleeding problems:    Problems with blood clotting too easily:        Skin    Rashes or ulcers:        Constitutional    Fever or chills:      PHYSICAL EXAM:    Vitals:   01/15/19 0834  BP: (!) 144/97  Pulse: 70  Resp: 16  Temp: (!) 97.5 F (36.4 C)  TempSrc: Temporal  SpO2: 99%  Weight: 137 lb (62.1 kg)  Height: 5\' 9"  (1.753 m)    GENERAL:The patient is a well-nourished male, in no acute distress. The vital signs are documented above. CARDIOVASCULAR:Prior right neck dissection with extensive surgery on the right neck.  2+ carotid pulse at base of right neck just above clavicle PULMONARY:There is good air exchange  MUSCULOSKELETAL:There are no major deformities or cyanosis. NEUROLOGIC:No focal weakness or paresthesias are detected. SKIN:There are no ulcers or rashes noted. PSYCHIATRIC:The patient has a normal affect.  DATA:   I independently reviewed his CTA neck and this shows a focal greater than 80% stenosis of the proximal right ICA.  The left ICA has a 50 to 55% stenosis.  Assessment/Plan:  63 year old male with history of tongue cancer that previously underwent right neck dissection with radiation that presents with asymptomatic high-grade right ICA stenosis.  Discussed plan for TCAR with stent placement given hostile neck.  This will require small incision at the base of the calvicle.  The details of the operation were discussed as well as risks and benefits.  I did discuss 1% risk of perioperative stroke.  He will need to stay on his aspirin Plavix and statin  in the perioperative period.  He is scheduled for 01/23/2019.  Discussed if neck too scarred for sheath placement, only other option would be transfemoral stent placement with 6% risk of perioperative stroke.   Marty Heck, MD [facility] Office: [phone] Pager: [phone]

## 2019-01-23 NOTE — Progress Notes (Signed)
Dr. Carlis Abbott notified patient has elevated BP upon arrival to short stay, as well as a wound on right side of neck.  Dr. Carlis Abbott to see patient to assess.

## 2019-01-24 DIAGNOSIS — J449 Chronic obstructive pulmonary disease, unspecified: Secondary | ICD-10-CM | POA: Diagnosis not present

## 2019-01-24 DIAGNOSIS — Z720 Tobacco use: Secondary | ICD-10-CM | POA: Diagnosis not present

## 2019-01-24 DIAGNOSIS — I10 Essential (primary) hypertension: Secondary | ICD-10-CM | POA: Diagnosis not present

## 2019-01-28 ENCOUNTER — Other Ambulatory Visit: Payer: Self-pay | Admitting: *Deleted

## 2019-02-18 NOTE — Progress Notes (Signed)
[place], [place] - [id number] [place] [place] [address] Phone: [phone] Fax: [phone]      Your procedure is scheduled on Thursday, September 24th.  Report to [facility] Main Entrance "A" at 5:30 A.M., and check in at the Admitting office.  Call this number if you have problems the morning of surgery:  [phone]  Call [phone] if you have any questions prior to your surgery date Monday-Friday 8am-4pm    Remember:  Do not eat or drink after midnight the night before your surgery     Take these medicines the morning of surgery with A SIP OF WATER  levothyroxine (SYNTHROID, LEVOTHROID)  Eye drops if needed.   Follow your surgeon's instructions on when to stop/resume Aspirin and Plavix.  If no instructions were given by your surgeon then you will need to call the office to get those instructions.    As of today, STOP taking any Aleve, Naproxen, Ibuprofen, Motrin, Advil, Goody's, BC's, all herbal medications, fish oil, and all vitamins.    The Morning of Surgery  Do not wear jewelry.  Do not wear lotions, powders, or colognes, or deodorant  Men may shave face and neck.  Do not bring valuables to the hospital.  [facility] is not responsible for any belongings or valuables.  If you are a smoker, DO NOT Smoke 24 hours prior to surgery IF you wear a CPAP at night please bring your mask, tubing, and machine the morning of surgery   Remember that you must have someone to transport you home after your surgery, and remain with you for 24 hours if you are discharged the same day.   Contacts, glasses, hearing aids, dentures or bridgework may not be worn into surgery.    Leave your suitcase in the car.  After surgery it may be brought to your room.  For patients admitted to the hospital, discharge time will be determined by your treatment team.  Patients discharged the day of surgery will not be allowed to  drive home.    Special instructions:   [place]- Preparing For Surgery  Before surgery, you can play an important role. Because skin is not sterile, your skin needs to be as free of germs as possible. You can reduce the number of germs on your skin by washing with CHG (chlorahexidine gluconate) Soap before surgery.  CHG is an antiseptic cleaner which kills germs and bonds with the skin to continue killing germs even after washing.    Oral Hygiene is also important to reduce your risk of infection.  Remember - BRUSH YOUR TEETH THE MORNING OF SURGERY WITH YOUR REGULAR TOOTHPASTE  Please do not use if you have an allergy to CHG or antibacterial soaps. If your skin becomes reddened/irritated stop using the CHG.  Do not shave (including legs and underarms) for at least 48 hours prior to first CHG shower. It is OK to shave your face.  Please follow these instructions carefully.   1. Shower the NIGHT BEFORE SURGERY and the MORNING OF SURGERY with CHG Soap.   2. If you chose to wash your hair, wash your hair first as usual with your normal shampoo.  3. After you shampoo, rinse your hair and body thoroughly to remove the shampoo.  4. Use CHG as you would any other liquid soap. You can apply CHG directly to the skin and wash gently with a scrungie or a clean washcloth.   5. Apply the  CHG Soap to your body ONLY FROM THE NECK DOWN.  Do not use on open wounds or open sores. Avoid contact with your eyes, ears, mouth and genitals (private parts). Wash Face and genitals (private parts)  with your normal soap.   6. Wash thoroughly, paying special attention to the area where your surgery will be performed.  7. Thoroughly rinse your body with warm water from the neck down.  8. DO NOT shower/wash with your normal soap after using and rinsing off the CHG Soap.  9. Pat yourself dry with a CLEAN TOWEL.  10. Wear CLEAN PAJAMAS to bed the night before surgery, wear comfortable clothes the morning of  surgery  11. Place CLEAN SHEETS on your bed the night of your first shower and DO NOT SLEEP WITH PETS.    Day of Surgery:  Do not apply any deodorants/lotions. Please shower the morning of surgery with the CHG soap  Please wear clean clothes to the hospital/surgery center.   Remember to brush your teeth WITH YOUR REGULAR TOOTHPASTE.   Please read over the following fact sheets that you were given.

## 2019-02-19 ENCOUNTER — Encounter (HOSPITAL_COMMUNITY): Payer: Self-pay

## 2019-02-19 ENCOUNTER — Other Ambulatory Visit: Payer: Self-pay

## 2019-02-19 ENCOUNTER — Other Ambulatory Visit (HOSPITAL_COMMUNITY)
Admission: RE | Admit: 2019-02-19 | Discharge: 2019-02-19 | Disposition: A | Discharge status: Home / Self Care | Payer: Medicare Other | Source: Ambulatory Visit | Attending: Vascular Surgery | Admitting: Vascular Surgery

## 2019-02-19 ENCOUNTER — Encounter (HOSPITAL_COMMUNITY)
Admission: RE | Admit: 2019-02-19 | Discharge: 2019-02-19 | Disposition: A | Discharge status: Home / Self Care | Payer: Medicare Other | Source: Ambulatory Visit | Attending: Vascular Surgery | Admitting: Vascular Surgery

## 2019-02-19 DIAGNOSIS — Z01812 Encounter for preprocedural laboratory examination: Secondary | ICD-10-CM | POA: Insufficient documentation

## 2019-02-19 LAB — COMPREHENSIVE METABOLIC PANEL
ALT: 38 U/L (ref 0–44)
AST: 30 U/L (ref 15–41)
Albumin: 4.1 g/dL (ref 3.5–5.0)
Alkaline Phosphatase: 63 U/L (ref 38–126)
Anion gap: 9 (ref 5–15)
BUN: 10 mg/dL (ref 8–23)
CO2: 26 mmol/L (ref 22–32)
Calcium: 9.1 mg/dL (ref 8.9–10.3)
Chloride: 106 mmol/L (ref 98–111)
Creatinine, Ser: 0.94 mg/dL (ref 0.61–1.24)
GFR calc Af Amer: >60 mL/min (ref >60)
GFR calc non Af Amer: >60 mL/min (ref >60)
Glucose, Bld: 89 mg/dL (ref 70–99)
Potassium: 3.9 mmol/L (ref 3.5–5.1)
Sodium: 141 mmol/L (ref 135–145)
Total Bilirubin: 0.9 mg/dL (ref 0.3–1.2)
Total Protein: 6.9 g/dL (ref 6.5–8.1)

## 2019-02-19 LAB — URINALYSIS, ROUTINE W REFLEX MICROSCOPIC
Bilirubin Urine: NEGATIVE
Glucose, UA: NEGATIVE mg/dL
Hgb urine dipstick: NEGATIVE
Ketones, ur: NEGATIVE mg/dL
Leukocytes,Ua: NEGATIVE
Nitrite: NEGATIVE
Protein, ur: NEGATIVE mg/dL
Specific Gravity, Urine: 1.006 (ref 1.005–1.030)
pH: 6 (ref 5.0–8.0)

## 2019-02-19 LAB — CBC
HCT: 45.6 % (ref 39.0–52.0)
Hemoglobin: 15.2 g/dL (ref 13.0–17.0)
MCH: 31.7 pg (ref 26.0–34.0)
MCHC: 33.3 g/dL (ref 30.0–36.0)
MCV: 95.2 fL (ref 80.0–100.0)
Platelets: 148 10*3/uL — ABNORMAL LOW (ref 150–400)
RBC: 4.79 MIL/uL (ref 4.22–5.81)
RDW: 12.3 % (ref 11.5–15.5)
WBC: 6 10*3/uL (ref 4.0–10.5)
nRBC: 0 % (ref 0.0–0.2)

## 2019-02-19 LAB — SARS CORONAVIRUS 2 (TAT 6-24 HRS): SARS Coronavirus 2: NEGATIVE

## 2019-02-19 LAB — TYPE AND SCREEN
ABO/RH(D): O POS
Antibody Screen: NEGATIVE

## 2019-02-19 LAB — PROTIME-INR
INR: 1.1 (ref 0.8–1.2)
Prothrombin Time: 14.5 seconds (ref 11.4–15.2)

## 2019-02-19 LAB — APTT: aPTT: 36 seconds (ref 24–36)

## 2019-02-19 NOTE — Progress Notes (Signed)
PCP - Marda Stalker, PA-C Cardiologist - denies  PPM/ICD - N/A Device Orders -N/A  Rep Notified  Chest x-ray - N/A EKG - 03/04/18 Stress Test - denies ECHO - 03/04/18 Cardiac Cath - denies  Sleep Study - denies CPAP - denies  Blood Thinner Instructions: Plavix; continue per surgeon Aspirin Instructions: continue per surgeon  ERAS Protcol -N/A PRE-SURGERY Ensure - N/A   COVID TEST- 02/19/19 after PAT appointment.   Anesthesia review: Yes, surgery previously cancelled d/t hypertension. Pt states that he f/u with PCP after his surgery was cancelled. BP has been better controlled.   Patient denies shortness of breath, fever, cough and chest pain at PAT appointment   Patient verbalized understanding of instructions that were given to them at the PAT appointment. Patient was also instructed that they will need to review over the PAT instructions again at home before surgery.   Coronavirus Screening  Have you experienced the following symptoms:  Cough yes/no: No Fever (>100.53F)  yes/no: No Runny nose yes/no: No Sore throat yes/no: No Difficulty breathing/shortness of breath  yes/no: No  Have you or a family member traveled in the last 14 days and where? yes/no: No   If the patient indicates "YES" to the above questions, their PAT will be rescheduled to limit the exposure to others and, the surgeon will be notified. THE PATIENT WILL NEED TO BE ASYMPTOMATIC FOR 14 DAYS.   If the patient is not experiencing any of these symptoms, the PAT nurse will instruct them to NOT bring anyone with them to their appointment since they may have these symptoms or traveled as well.   Please remind your patients and families that hospital visitation restrictions are in effect and the importance of the restrictions.

## 2019-02-20 NOTE — Anesthesia Preprocedure Evaluation (Addendum)
Anesthesia Evaluation  Patient identified by MRN, date of birth, ID band Patient awake    Reviewed: Allergy & Precautions, NPO status , Patient's Chart, lab work & pertinent test results  History of Anesthesia Complications Negative for: history of anesthetic complications  Airway Mallampati: I  TM Distance: >3 FB Neck ROM: Full    Dental  (+) Edentulous Upper, Edentulous Lower   Pulmonary COPD, Patient abstained from smoking., former smoker,    Pulmonary exam normal        Cardiovascular hypertension, Pt. on medications (-) angina+ Peripheral Vascular Disease  Normal cardiovascular exam   '20 Carotid US - 80-99% right ICAS, 60-79% left ICAS  '19 TTE - mild concentric LVH. EF 55% to 60%. There was diffuse, mildly ulcerated atheromatous plaque in the abdominal aorta. Ascending aortic diameter: 40 mm, mildly dilated.    Neuro/Psych  Headaches, PSYCHIATRIC DISORDERS Anxiety Depression  OCD CVA, Residual Symptoms    GI/Hepatic negative GI ROS, (+)     substance abuse  marijuana use,   Endo/Other  Hypothyroidism   Renal/GU negative Renal ROS     Musculoskeletal negative musculoskeletal ROS (+)   Abdominal   Peds  Hematology negative hematology ROS (+)   Anesthesia Other Findings Tongue cancer s/p radiation   Reproductive/Obstetrics                           Anesthesia Physical Anesthesia Plan  ASA: III  Anesthesia Plan: General   Post-op Pain Management:    Induction: Intravenous  PONV Risk Score and Plan: 2 and Treatment may vary due to age or medical condition, Ondansetron, Dexamethasone and Midazolam  Airway Management Planned: Oral ETT  Additional Equipment: Arterial line  Intra-op Plan:   Post-operative Plan: Possible Post-op intubation/ventilation  Informed Consent: I have reviewed the patients History and Physical, chart, labs and discussed the procedure including  the risks, benefits and alternatives for the proposed anesthesia with the patient or authorized representative who has indicated his/her understanding and acceptance.     Dental advisory given  Plan Discussed with: CRNA and Anesthesiologist  Anesthesia Plan Comments: (Procedure previously cancelled 01/23/19 due to uncontrolled BP on DOS. Pt reportedly seen by PCP in interim and BP better controlled. BP 165/91 at PAT on 02/20/19. )     Anesthesia Quick Evaluation

## 2019-02-21 ENCOUNTER — Inpatient Hospital Stay (HOSPITAL_COMMUNITY): Payer: BC Managed Care – PPO | Admitting: Certified Registered Nurse Anesthetist

## 2019-02-21 ENCOUNTER — Inpatient Hospital Stay (HOSPITAL_COMMUNITY)
Admission: RE | Admit: 2019-02-21 | Discharge: 2019-02-22 | DRG: 036 | Disposition: A | Discharge status: Home / Self Care | Payer: BC Managed Care – PPO | Attending: Vascular Surgery | Admitting: Vascular Surgery

## 2019-02-21 ENCOUNTER — Encounter (HOSPITAL_COMMUNITY)
Admission: RE | Disposition: A | Discharge status: Home / Self Care | Payer: Self-pay | Source: Home / Self Care | Attending: Vascular Surgery

## 2019-02-21 ENCOUNTER — Inpatient Hospital Stay (HOSPITAL_COMMUNITY): Payer: BC Managed Care – PPO

## 2019-02-21 ENCOUNTER — Encounter (HOSPITAL_COMMUNITY): Payer: Self-pay

## 2019-02-21 ENCOUNTER — Inpatient Hospital Stay (HOSPITAL_COMMUNITY): Payer: BC Managed Care – PPO | Admitting: Physician Assistant

## 2019-02-21 ENCOUNTER — Other Ambulatory Visit: Payer: Self-pay

## 2019-02-21 DIAGNOSIS — Z923 Personal history of irradiation: Secondary | ICD-10-CM

## 2019-02-21 DIAGNOSIS — J449 Chronic obstructive pulmonary disease, unspecified: Secondary | ICD-10-CM | POA: Diagnosis present

## 2019-02-21 DIAGNOSIS — F418 Other specified anxiety disorders: Secondary | ICD-10-CM | POA: Diagnosis present

## 2019-02-21 DIAGNOSIS — I739 Peripheral vascular disease, unspecified: Secondary | ICD-10-CM | POA: Diagnosis present

## 2019-02-21 DIAGNOSIS — Z7982 Long term (current) use of aspirin: Secondary | ICD-10-CM

## 2019-02-21 DIAGNOSIS — Z79890 Hormone replacement therapy (postmenopausal): Secondary | ICD-10-CM

## 2019-02-21 DIAGNOSIS — F1290 Cannabis use, unspecified, uncomplicated: Secondary | ICD-10-CM | POA: Diagnosis not present

## 2019-02-21 DIAGNOSIS — E039 Hypothyroidism, unspecified: Secondary | ICD-10-CM | POA: Diagnosis present

## 2019-02-21 DIAGNOSIS — I10 Essential (primary) hypertension: Secondary | ICD-10-CM | POA: Diagnosis not present

## 2019-02-21 DIAGNOSIS — J309 Allergic rhinitis, unspecified: Secondary | ICD-10-CM | POA: Diagnosis present

## 2019-02-21 DIAGNOSIS — F4310 Post-traumatic stress disorder, unspecified: Secondary | ICD-10-CM | POA: Diagnosis not present

## 2019-02-21 DIAGNOSIS — I6521 Occlusion and stenosis of right carotid artery: Principal | ICD-10-CM | POA: Diagnosis present

## 2019-02-21 DIAGNOSIS — Z85810 Personal history of malignant neoplasm of tongue: Secondary | ICD-10-CM

## 2019-02-21 DIAGNOSIS — Z7902 Long term (current) use of antithrombotics/antiplatelets: Secondary | ICD-10-CM | POA: Diagnosis not present

## 2019-02-21 DIAGNOSIS — Z79899 Other long term (current) drug therapy: Secondary | ICD-10-CM

## 2019-02-21 DIAGNOSIS — Z20828 Contact with and (suspected) exposure to other viral communicable diseases: Secondary | ICD-10-CM | POA: Diagnosis not present

## 2019-02-21 DIAGNOSIS — Z8673 Personal history of transient ischemic attack (TIA), and cerebral infarction without residual deficits: Secondary | ICD-10-CM

## 2019-02-21 DIAGNOSIS — F17210 Nicotine dependence, cigarettes, uncomplicated: Secondary | ICD-10-CM | POA: Diagnosis present

## 2019-02-21 DIAGNOSIS — I6529 Occlusion and stenosis of unspecified carotid artery: Secondary | ICD-10-CM | POA: Diagnosis present

## 2019-02-21 DIAGNOSIS — F429 Obsessive-compulsive disorder, unspecified: Secondary | ICD-10-CM | POA: Diagnosis present

## 2019-02-21 HISTORY — PX: TRANSCAROTID ARTERY REVASCULARIZATIONÂ: SHX6778

## 2019-02-21 LAB — POCT ACTIVATED CLOTTING TIME: Activated Clotting Time: 323 seconds

## 2019-02-21 SURGERY — TRANSCAROTID ARTERY REVASCULARIZATION (TCAR)
Anesthesia: General | Site: Neck | Laterality: Right

## 2019-02-21 MED ORDER — ALUM & MAG HYDROXIDE-SIMETH 200-200-20 MG/5ML PO SUSP: 15.0000 mL | ORAL | Status: DC | PRN

## 2019-02-21 MED ORDER — ONDANSETRON HCL 4 MG/2ML IJ SOLN
INTRAMUSCULAR | Status: DC | PRN
  Administered 2019-02-21: 4 mg via INTRAVENOUS

## 2019-02-21 MED ORDER — CHLORHEXIDINE GLUCONATE CLOTH 2 % EX PADS: 6.0000 | MEDICATED_PAD | Freq: Once | CUTANEOUS | Status: DC

## 2019-02-21 MED ORDER — ONDANSETRON HCL 4 MG/2ML IJ SOLN
INTRAMUSCULAR | Status: AC
  Filled 2019-02-21: qty 2

## 2019-02-21 MED ORDER — CEFAZOLIN SODIUM-DEXTROSE 2-4 GM/100ML-% IV SOLN
2.0000 g | INTRAVENOUS | Status: AC
  Administered 2019-02-21: 2 g via INTRAVENOUS

## 2019-02-21 MED ORDER — OXYCODONE-ACETAMINOPHEN 5-325 MG PO TABS
1.0000 | ORAL_TABLET | ORAL | Status: DC | PRN
  Administered 2019-02-21: 1 via ORAL
  Filled 2019-02-21: qty 1

## 2019-02-21 MED ORDER — PROTAMINE SULFATE 10 MG/ML IV SOLN
INTRAVENOUS | Status: AC
  Filled 2019-02-21: qty 5

## 2019-02-21 MED ORDER — ATORVASTATIN CALCIUM 80 MG PO TABS
80.0000 mg | ORAL_TABLET | Freq: Every day | ORAL | Status: DC
  Administered 2019-02-21: 80 mg via ORAL
  Filled 2019-02-21: qty 1

## 2019-02-21 MED ORDER — MIDAZOLAM HCL 2 MG/2ML IJ SOLN
INTRAMUSCULAR | Status: DC | PRN
  Administered 2019-02-21 (×2): 1 mg via INTRAVENOUS

## 2019-02-21 MED ORDER — PHENOL 1.4 % MT LIQD: 1.0000 | OROMUCOSAL | Status: DC | PRN

## 2019-02-21 MED ORDER — CLOPIDOGREL BISULFATE 75 MG PO TABS
75.0000 mg | ORAL_TABLET | Freq: Every day | ORAL | Status: DC
  Administered 2019-02-21 – 2019-02-22 (×2): 75 mg via ORAL
  Filled 2019-02-21 (×2): qty 1

## 2019-02-21 MED ORDER — SODIUM CHLORIDE 0.9 % IV SOLN: INTRAVENOUS | Status: DC

## 2019-02-21 MED ORDER — OXYCODONE HCL 5 MG/5ML PO SOLN: 5.0000 mg | Freq: Once | ORAL | Status: DC | PRN

## 2019-02-21 MED ORDER — SODIUM CHLORIDE 0.9 % IV SOLN
INTRAVENOUS | Status: DC | PRN
  Administered 2019-02-21: 500 mL

## 2019-02-21 MED ORDER — FENTANYL CITRATE (PF) 250 MCG/5ML IJ SOLN
INTRAMUSCULAR | Status: DC | PRN
  Administered 2019-02-21 (×2): 50 ug via INTRAVENOUS

## 2019-02-21 MED ORDER — ASPIRIN 81 MG PO CHEW
81.0000 mg | CHEWABLE_TABLET | Freq: Every day | ORAL | Status: DC
  Administered 2019-02-22: 81 mg via ORAL
  Filled 2019-02-21: qty 1

## 2019-02-21 MED ORDER — HYDRALAZINE HCL 20 MG/ML IJ SOLN: 5.0000 mg | INTRAMUSCULAR | Status: DC | PRN

## 2019-02-21 MED ORDER — EPHEDRINE SULFATE-NACL 50-0.9 MG/10ML-% IV SOSY
PREFILLED_SYRINGE | INTRAVENOUS | Status: DC | PRN
  Administered 2019-02-21: 2.5 mg via INTRAVENOUS
  Administered 2019-02-21: 5 mg via INTRAVENOUS

## 2019-02-21 MED ORDER — GLYCOPYRROLATE PF 0.2 MG/ML IJ SOSY
PREFILLED_SYRINGE | INTRAMUSCULAR | Status: AC
  Filled 2019-02-21: qty 1

## 2019-02-21 MED ORDER — FENTANYL CITRATE (PF) 250 MCG/5ML IJ SOLN
INTRAMUSCULAR | Status: AC
  Filled 2019-02-21: qty 5

## 2019-02-21 MED ORDER — ACETAMINOPHEN 325 MG RE SUPP: 325.0000 mg | RECTAL | Status: DC | PRN

## 2019-02-21 MED ORDER — LABETALOL HCL 5 MG/ML IV SOLN
INTRAVENOUS | Status: AC
  Filled 2019-02-21: qty 4

## 2019-02-21 MED ORDER — PROPOFOL 10 MG/ML IV BOLUS
INTRAVENOUS | Status: DC | PRN
  Administered 2019-02-21: 20 mg via INTRAVENOUS
  Administered 2019-02-21: 130 mg via INTRAVENOUS
  Administered 2019-02-21: 70 mg via INTRAVENOUS
  Administered 2019-02-21: 30 mg via INTRAVENOUS

## 2019-02-21 MED ORDER — SUGAMMADEX SODIUM 200 MG/2ML IV SOLN
INTRAVENOUS | Status: DC | PRN
  Administered 2019-02-21: 140 mg via INTRAVENOUS

## 2019-02-21 MED ORDER — ROCURONIUM BROMIDE 10 MG/ML (PF) SYRINGE
PREFILLED_SYRINGE | INTRAVENOUS | Status: AC
  Filled 2019-02-21: qty 10

## 2019-02-21 MED ORDER — LIDOCAINE HCL (PF) 1 % IJ SOLN
INTRAMUSCULAR | Status: AC
  Filled 2019-02-21: qty 30

## 2019-02-21 MED ORDER — LEVOTHYROXINE SODIUM 100 MCG PO TABS
100.0000 ug | ORAL_TABLET | Freq: Every day | ORAL | Status: DC
  Administered 2019-02-22: 100 ug via ORAL
  Filled 2019-02-21: qty 1

## 2019-02-21 MED ORDER — DEXAMETHASONE SODIUM PHOSPHATE 10 MG/ML IJ SOLN
INTRAMUSCULAR | Status: AC
  Filled 2019-02-21: qty 1

## 2019-02-21 MED ORDER — PROPOFOL 10 MG/ML IV BOLUS
INTRAVENOUS | Status: AC
  Filled 2019-02-21: qty 40

## 2019-02-21 MED ORDER — OXYCODONE HCL 5 MG PO TABS: 5.0000 mg | ORAL_TABLET | Freq: Once | ORAL | Status: DC | PRN

## 2019-02-21 MED ORDER — LABETALOL HCL 5 MG/ML IV SOLN
10.0000 mg | INTRAVENOUS | Status: DC | PRN
  Administered 2019-02-21: 10 mg via INTRAVENOUS
  Filled 2019-02-21: qty 4

## 2019-02-21 MED ORDER — CEFAZOLIN SODIUM-DEXTROSE 2-4 GM/100ML-% IV SOLN
INTRAVENOUS | Status: AC
  Filled 2019-02-21: qty 100

## 2019-02-21 MED ORDER — DEXAMETHASONE SODIUM PHOSPHATE 10 MG/ML IJ SOLN
INTRAMUSCULAR | Status: DC | PRN
  Administered 2019-02-21: 8 mg via INTRAVENOUS

## 2019-02-21 MED ORDER — LIDOCAINE HCL (CARDIAC) PF 100 MG/5ML IV SOSY
PREFILLED_SYRINGE | INTRAVENOUS | Status: DC | PRN
  Administered 2019-02-21: 60 mg via INTRAVENOUS

## 2019-02-21 MED ORDER — PANTOPRAZOLE SODIUM 40 MG PO TBEC
40.0000 mg | DELAYED_RELEASE_TABLET | Freq: Every day | ORAL | Status: DC
  Administered 2019-02-21 – 2019-02-22 (×2): 40 mg via ORAL
  Filled 2019-02-21 (×2): qty 1

## 2019-02-21 MED ORDER — LACTATED RINGERS IV SOLN
INTRAVENOUS | Status: DC | PRN
  Administered 2019-02-21: 07:00:00 via INTRAVENOUS

## 2019-02-21 MED ORDER — ONDANSETRON HCL 4 MG/2ML IJ SOLN: 4.0000 mg | Freq: Four times a day (QID) | INTRAMUSCULAR | Status: DC | PRN

## 2019-02-21 MED ORDER — GLYCOPYRROLATE 0.2 MG/ML IJ SOLN
INTRAMUSCULAR | Status: DC | PRN
  Administered 2019-02-21 (×2): 0.2 mg via INTRAVENOUS

## 2019-02-21 MED ORDER — METOPROLOL TARTRATE 5 MG/5ML IV SOLN: 2.0000 mg | INTRAVENOUS | Status: DC | PRN

## 2019-02-21 MED ORDER — HEPARIN SODIUM (PORCINE) 1000 UNIT/ML IJ SOLN
INTRAMUSCULAR | Status: DC | PRN
  Administered 2019-02-21: 7000 [IU] via INTRAVENOUS

## 2019-02-21 MED ORDER — GUAIFENESIN-DM 100-10 MG/5ML PO SYRP: 15.0000 mL | ORAL_SOLUTION | ORAL | Status: DC | PRN

## 2019-02-21 MED ORDER — 0.9 % SODIUM CHLORIDE (POUR BTL) OPTIME
TOPICAL | Status: DC | PRN
  Administered 2019-02-21: 2000 mL

## 2019-02-21 MED ORDER — HEPARIN SODIUM (PORCINE) 1000 UNIT/ML IJ SOLN
INTRAMUSCULAR | Status: AC
  Filled 2019-02-21: qty 1

## 2019-02-21 MED ORDER — CEFAZOLIN SODIUM-DEXTROSE 2-4 GM/100ML-% IV SOLN
2.0000 g | Freq: Three times a day (TID) | INTRAVENOUS | Status: AC
  Administered 2019-02-21 (×2): 2 g via INTRAVENOUS
  Filled 2019-02-21 (×3): qty 100

## 2019-02-21 MED ORDER — MORPHINE SULFATE (PF) 2 MG/ML IV SOLN: 2.0000 mg | INTRAVENOUS | Status: DC | PRN

## 2019-02-21 MED ORDER — SODIUM CHLORIDE 0.9 % IV SOLN: 500.0000 mL | Freq: Once | INTRAVENOUS | Status: DC | PRN

## 2019-02-21 MED ORDER — SODIUM CHLORIDE 0.9 % IV SOLN
INTRAVENOUS | Status: AC
  Administered 2019-02-21: 12:00:00 via INTRAVENOUS

## 2019-02-21 MED ORDER — PROMETHAZINE HCL 25 MG/ML IJ SOLN: 6.2500 mg | INTRAMUSCULAR | Status: DC | PRN

## 2019-02-21 MED ORDER — DOCUSATE SODIUM 100 MG PO CAPS
100.0000 mg | ORAL_CAPSULE | Freq: Every day | ORAL | Status: DC
  Administered 2019-02-22: 100 mg via ORAL
  Filled 2019-02-21: qty 1

## 2019-02-21 MED ORDER — ACETAMINOPHEN 325 MG PO TABS: 325.0000 mg | ORAL_TABLET | ORAL | Status: DC | PRN

## 2019-02-21 MED ORDER — PROTAMINE SULFATE 10 MG/ML IV SOLN
INTRAVENOUS | Status: DC | PRN
  Administered 2019-02-21: 40 mg via INTRAVENOUS

## 2019-02-21 MED ORDER — SODIUM CHLORIDE 0.9 % IV SOLN
INTRAVENOUS | Status: DC | PRN
  Administered 2019-02-21: 20 ug/min via INTRAVENOUS

## 2019-02-21 MED ORDER — FENTANYL CITRATE (PF) 100 MCG/2ML IJ SOLN: 25.0000 ug | INTRAMUSCULAR | Status: DC | PRN

## 2019-02-21 MED ORDER — LIDOCAINE 2% (20 MG/ML) 5 ML SYRINGE
INTRAMUSCULAR | Status: AC
  Filled 2019-02-21: qty 5

## 2019-02-21 MED ORDER — POTASSIUM CHLORIDE CRYS ER 20 MEQ PO TBCR: 20.0000 meq | EXTENDED_RELEASE_TABLET | Freq: Every day | ORAL | Status: DC | PRN

## 2019-02-21 MED ORDER — IODIXANOL 320 MG/ML IV SOLN
INTRAVENOUS | Status: DC | PRN
  Administered 2019-02-21: 38 mL

## 2019-02-21 MED ORDER — LABETALOL HCL 5 MG/ML IV SOLN: 5.0000 mg | INTRAVENOUS | Status: DC | PRN

## 2019-02-21 MED ORDER — ROCURONIUM BROMIDE 100 MG/10ML IV SOLN
INTRAVENOUS | Status: DC | PRN
  Administered 2019-02-21: 60 mg via INTRAVENOUS

## 2019-02-21 MED ORDER — SODIUM CHLORIDE 0.9 % IV SOLN
INTRAVENOUS | Status: AC
  Filled 2019-02-21: qty 1.2

## 2019-02-21 MED ORDER — MAGNESIUM SULFATE 2 GM/50ML IV SOLN: 2.0000 g | Freq: Every day | INTRAVENOUS | Status: DC | PRN

## 2019-02-21 MED ORDER — MIDAZOLAM HCL 2 MG/2ML IJ SOLN
INTRAMUSCULAR | Status: AC
  Filled 2019-02-21: qty 2

## 2019-02-21 SURGICAL SUPPLY — 82 items
ADH SKN CLS APL DERMABOND .7 (GAUZE/BANDAGES/DRESSINGS) ×2
ADPR TBG 2 MALE LL ART (MISCELLANEOUS)
BAG BANDED W/RUBBER/TAPE 36X54 (MISCELLANEOUS) ×2 IMPLANT
BAG EQP BAND 135X91 W/RBR TAPE (MISCELLANEOUS) ×1
BALLN STERLING RX 4X30X80 (BALLOONS) ×2
BALLOON STERLING RX 4X30X80 (BALLOONS) IMPLANT
CANISTER SUCT 3000ML PPV (MISCELLANEOUS) ×2 IMPLANT
CATH ROBINSON RED A/P 18FR (CATHETERS) IMPLANT
CLIP VESOCCLUDE MED 6/CT (CLIP) ×2 IMPLANT
CLIP VESOCCLUDE SM WIDE 6/CT (CLIP) ×2 IMPLANT
COVER DOME SNAP 22 D (MISCELLANEOUS) ×2 IMPLANT
COVER PROBE W GEL 5X96 (DRAPES) ×2 IMPLANT
DERMABOND ADVANCED (GAUZE/BANDAGES/DRESSINGS) ×2
DERMABOND ADVANCED .7 DNX12 (GAUZE/BANDAGES/DRESSINGS) ×1 IMPLANT
DRAIN CHANNEL 15F RND FF W/TCR (WOUND CARE) IMPLANT
DRAPE INCISE IOBAN 66X45 STRL (DRAPES) ×4 IMPLANT
DRAPE INCISE IOBAN 85X60 (DRAPES) ×4 IMPLANT
ELECT REM PT RETURN 9FT ADLT (ELECTROSURGICAL) ×2
ELECTRODE REM PT RTRN 9FT ADLT (ELECTROSURGICAL) ×1 IMPLANT
EVACUATOR SILICONE 100CC (DRAIN) IMPLANT
GLOVE BIO SURGEON STRL SZ 6.5 (GLOVE) ×1 IMPLANT
GLOVE BIO SURGEON STRL SZ7 (GLOVE) ×1 IMPLANT
GLOVE BIO SURGEON STRL SZ7.5 (GLOVE) ×2 IMPLANT
GLOVE BIOGEL PI IND STRL 6.5 (GLOVE) IMPLANT
GLOVE BIOGEL PI IND STRL 7.0 (GLOVE) IMPLANT
GLOVE BIOGEL PI INDICATOR 6.5 (GLOVE) ×1
GLOVE BIOGEL PI INDICATOR 7.0 (GLOVE) ×2
GLOVE SURG SS PI 6.5 STRL IVOR (GLOVE) ×1 IMPLANT
GOWN STRL REUS W/ TWL LRG LVL3 (GOWN DISPOSABLE) ×2 IMPLANT
GOWN STRL REUS W/ TWL XL LVL3 (GOWN DISPOSABLE) ×1 IMPLANT
GOWN STRL REUS W/TWL LRG LVL3 (GOWN DISPOSABLE) ×4
GOWN STRL REUS W/TWL XL LVL3 (GOWN DISPOSABLE) ×2
GUIDEWIRE ENROUTE 0.014 (WIRE) ×1 IMPLANT
HEMOSTAT SNOW SURGICEL 2X4 (HEMOSTASIS) IMPLANT
INSERT FOGARTY SM (MISCELLANEOUS) IMPLANT
INTRODUCER KIT GALT 7CM (INTRODUCER) ×4
IV ADAPTER SYR DOUBLE MALE LL (MISCELLANEOUS) IMPLANT
KIT BASIN OR (CUSTOM PROCEDURE TRAY) ×2 IMPLANT
KIT ENCORE 26 ADVANTAGE (KITS) ×2 IMPLANT
KIT INTRODUCER GALT 7 (INTRODUCER) ×1 IMPLANT
KIT TURNOVER KIT B (KITS) ×2 IMPLANT
NDL HYPO 25GX1X1/2 BEV (NEEDLE) IMPLANT
NDL PERC 18GX7CM (NEEDLE) ×1 IMPLANT
NDL SPNL 20GX3.5 QUINCKE YW (NEEDLE) IMPLANT
NEEDLE HYPO 25GX1X1/2 BEV (NEEDLE) IMPLANT
NEEDLE PERC 18GX7CM (NEEDLE) ×2 IMPLANT
NEEDLE SPNL 20GX3.5 QUINCKE YW (NEEDLE) IMPLANT
PACK CAROTID (CUSTOM PROCEDURE TRAY) ×2 IMPLANT
PAD ARMBOARD 7.5X6 YLW CONV (MISCELLANEOUS) ×4 IMPLANT
POSITIONER HEAD DONUT 9IN (MISCELLANEOUS) ×2 IMPLANT
PROTECTION STATION PRESSURIZED (MISCELLANEOUS) ×2
SET MICROPUNCTURE 5F STIFF (MISCELLANEOUS) ×2 IMPLANT
SHEATH AVANTI 11CM 5FR (SHEATH) IMPLANT
SHUNT CAROTID BYPASS 10 (VASCULAR PRODUCTS) IMPLANT
SHUNT CAROTID BYPASS 12FRX15.5 (VASCULAR PRODUCTS) IMPLANT
STATION PROTECTION PRESSURIZED (MISCELLANEOUS) ×1 IMPLANT
STENT TRANSCAROTID SYS 7X30 (Permanent Stent) ×1 IMPLANT
STOPCOCK 4 WAY LG BORE MALE ST (IV SETS) ×2 IMPLANT
SUT ETHILON 3 0 PS 1 (SUTURE) IMPLANT
SUT MNCRL AB 4-0 PS2 18 (SUTURE) ×1 IMPLANT
SUT PROLENE 5 0 C 1 24 (SUTURE) ×2 IMPLANT
SUT PROLENE 6 0 BV (SUTURE) ×1 IMPLANT
SUT PROLENE 7 0 BV 1 (SUTURE) IMPLANT
SUT SILK 2 0 SH (SUTURE) ×1 IMPLANT
SUT SILK 2 0 SH CR/8 (SUTURE) ×2 IMPLANT
SUT SILK 3 0 (SUTURE)
SUT SILK 3-0 18XBRD TIE 12 (SUTURE) IMPLANT
SUT VIC AB 3-0 SH 27 (SUTURE) ×2
SUT VIC AB 3-0 SH 27X BRD (SUTURE) ×1 IMPLANT
SUT VICRYL 4-0 PS2 18IN ABS (SUTURE) ×2 IMPLANT
SYR 10ML LL (SYRINGE) ×6 IMPLANT
SYR 20ML LL LF (SYRINGE) ×2 IMPLANT
SYR 5ML LL (SYRINGE) ×2 IMPLANT
SYR CONTROL 10ML LL (SYRINGE) IMPLANT
SYSTEM TRANSCAROTID NEUROPRTCT (MISCELLANEOUS) IMPLANT
TOWEL GREEN STERILE (TOWEL DISPOSABLE) ×2 IMPLANT
TRANSCAROTID NEUROPROTECT SYS (MISCELLANEOUS) ×2
TUBING ART PRESS 48 MALE/FEM (TUBING) IMPLANT
TUBING EXTENTION W/L.L. (IV SETS) IMPLANT
WATER STERILE IRR 1000ML POUR (IV SOLUTION) ×2 IMPLANT
WIRE AMPLATZ SS-J .035X180CM (WIRE) IMPLANT
WIRE BENTSON .035X145CM (WIRE) ×2 IMPLANT

## 2019-02-21 NOTE — Op Note (Signed)
Date: February 21, 2019  Preoperative diagnosis: Asymptomatic high-grade right internal carotid artery stenosis status post head and neck radiation  Postoperative diagnosis: Same  Procedure: 1.  Ultrasound-guided vascular access of the left common femoral vein with sheath placement 2.  Transcatheter placement of intravascular stent in the right cervical carotid artery including angioplasty (right TCAR)  Surgeon: Dr. Marty Heck, MD  Assistant: Dr. 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  Ultimately he was noted to have progression to high-grade greater than 80% right ICA stenosis.  It was felt he was too high risk for open carotid endarterectomy given his history of radiation and neck dissection.  He presents today for planned right TCAR after risk and benefits were discussed.  Findings: Greater than 80% stenosis of the right internal carotid artery.  Placement of Enroute flow reversal sheath in the right common carotid artery and then predilation with a 4 mm x 30 mm balloon followed by placement of a 7 mm x 30 mm stent with no residual stenosis.  Anesthesia: General  Details: The patient was taken to the operating room after informed consent was obtained.  He was placed on operative table in supine position.  General endotracheal anesthesia was induced.  A bump was placed under his shoulders and his neck was turned to the left to expose his right side.  I used ultrasound to mark the internal carotid artery just above the clavicle.  Ultimately his right neck was prepped and draped usual sterile fashion.  Preop timeout was performed identify patient, procedure and site.  Initially made a longitudinal incision over the right carotid artery just above the clavicle.  Dissected down with Bovie cautery to open the  subcutaneous tissue.  Use Metzenbaum scissors with blunt dissection the nerve was identified posteriorly and the artery was circumferentially mobilized.  Ultimately large vessel loop was placed around the common carotid artery just above the clavicle.  I then used a 5-0 Prolene and placed a pursestring suture in the anterior wall of the common carotid artery.  That point in time Dr. Trula Slade then used sterile ultrasound and evaluated the left common femoral vein.  It was patent and image was saved.  A micro access needle was used to access the left common femoral vein and a microwire was placed and then a micro-sheath.  Then used a Bentson wire and exchanged for a TCAR sheath that was placed in the left common femoral vein.  The patient was given 100 units heparin/kg. Then used our micro access needle and accessed the right common carotid artery where pursestring was placed.  Once we had backbleeding we placed a micro wire and stayed below the carotid bifurcation.  I then placed a micro-sheath over the microwire approximately 2-1/2 cm into the artery.  We then used extension tubing and got a cerebral angiogram to evaluate the right common carotid artery access as well as the bifurcation and the ICA lesion.  All of these were marked on the screen.  We then placed a stiffer J-wire and exchanged for the TCAR flow reversal sheath that was then placed into the base of the common carotid artery just above the clavicle.  We used 2-0 silk sutures and the sheath was secured in 2 locations.  Then confirmed that we had a therapeutic ACT greater than 250.  Performed a TCAR timeout to  confirm heart rate was greater than 70 and systolics greater than [id number].  Then clamped the base of the common carotid artery with our vessel loop and went on active flow reversal.  That point in time then used our angioplasty balloon and 014 guidewire and crossed the lesion in the ICA.  We did take one additional cerebral angiogram to confirm we were  in the correct location in the internal carotid artery and again the lesion was marked.  We then advanced our balloon and predilated the ICA lesion with a 4 mm x 30 mm balloon to nominal pressure.  Then placed a 7 mm x 30 mm Enroute stent across the stenosis in the ICA over the wire and the stent was hung into carotid bifurcation.  The stent expanded very nicely and we shot one final angiogram that again showed good wall apposition with no evidence of dissection and we did not feel that this needed to be post ballooned.  That point in time the active flow reversal was continued for 2 more minutes.  We then disconnected the flow reversal system.  The Enroute sheath was removed in the common carotid artery and the pursestring was tied down.  I did have place one other figure-of-eight with a 5-0 Prolene for hemostasis.  I used the Doppler and there was excellent signal in the common carotid artery proximal and distal to the access site.  Patient was given 40 mg of protamine for reversal.  Sheath was removed in the left common femoral vein and manual pressure was held.  I washed out the neck incision.  This was closed in two layers with 3-0 Vicryl and 4-0 Monocryl.  Dermabond was applied.  Awakened from anesthesia and was neurologically intact.  Fluoroscopy time: 3.4 minutes  Contrast volume: 38 mL's  Procedure time: 45 minutes  Complications: None  Condition: Stable  Marty Heck, MD [facility] Office: [phone] Pager: [place]

## 2019-02-21 NOTE — Transfer of Care (Signed)
Immediate Anesthesia Transfer of Care Note  Patient: Lawrence Owen  Procedure(s) Performed: Dorthula Perfect ARTERY REVASCULARIZATION (Right Neck)  Patient Location: PACU  Anesthesia Type:General  Level of Consciousness: awake, alert , oriented and patient cooperative  Airway & Oxygen Therapy: Patient Spontanous Breathing  Post-op Assessment: Report given to RN, Post -op Vital signs reviewed and stable and Patient moving all extremities X 4  Post vital signs: Reviewed and stable  Last Vitals:  Vitals Value Taken Time  BP 167/110 02/21/19 0920  Temp    Pulse 90 02/21/19 0920  Resp 19 02/21/19 0920  SpO2 100 % 02/21/19 0920  Vitals shown include unvalidated device data.  Last Pain:  Vitals:   02/21/19 0630  TempSrc:   PainSc: 0-No pain      Patients Stated Pain Goal: 2 ([id number])  Complications: No apparent anesthesia complications

## 2019-02-21 NOTE — Progress Notes (Signed)
Patient arrived to 4E room 17 at this time. Telemetry applied and CCMD notified. V/a and assessment complete. CHG bath done. Patient oriented to room and call bell. Will continue to monitor.   Emelda Fear, RN

## 2019-02-21 NOTE — Anesthesia Procedure Notes (Signed)
Procedure Name: Intubation Date/Time: 02/21/2019 7:41 AM Performed by: Raenette Rover, CRNA Pre-anesthesia Checklist: Patient identified, Emergency Drugs available, Suction available and Patient being monitored Patient Re-evaluated:Patient Re-evaluated prior to induction Oxygen Delivery Method: Circle system utilized Preoxygenation: Pre-oxygenation with 100% oxygen Induction Type: IV induction Ventilation: Mask ventilation without difficulty and Oral airway inserted - appropriate to patient size Laryngoscope Size: Miller and 3 Grade View: Grade I Tube type: Oral Tube size: 7.5 mm Number of attempts: 1 Airway Equipment and Method: Stylet Placement Confirmation: ETT inserted through vocal cords under direct vision,  positive ETCO2 and breath sounds checked- equal and bilateral Secured at: 22 cm Tube secured with: Tape Dental Injury: Teeth and Oropharynx as per pre-operative assessment

## 2019-02-21 NOTE — Anesthesia Procedure Notes (Signed)
Arterial Line Insertion Start/End9/24/2020 7:00 AM, 02/21/2019 7:15 AM Performed by: Audry Pili, MD, Raenette Rover, CRNA, CRNA  Patient location: Pre-op. Preanesthetic checklist: patient identified, IV checked, site marked, risks and benefits discussed, surgical consent, monitors and equipment checked, pre-op evaluation, timeout performed and anesthesia consent Lidocaine 1% used for infiltration Right, radial was placed Catheter size: 20 G  Attempts: 1 Procedure performed without using ultrasound guided technique. Following insertion, dressing applied and Biopatch. Post procedure assessment: normal  Patient tolerated the procedure well with no immediate complications.

## 2019-02-21 NOTE — H&P (Signed)
History and Physical Interval Note:  02/21/2019 7:22 AM  Lawrence Owen  has presented today for surgery, with the diagnosis of right carotid stenosis.  The various methods of treatment have been discussed with the patient and family. After consideration of risks, benefits and other options for treatment, the patient has consented to  Procedure(s): TRANSCAROTID ARTERY REVASCULARIZATION (Right) as a surgical intervention.  The patient's history has been reviewed, patient examined, no change in status, stable for surgery.  I have reviewed the patient's chart and labs.  Questions were answered to the patient's satisfaction.    Right TCAR.  Asymptomatic high grade stenosis.  BP better controlled.  Taking aspirin and plavix.    Marty Heck  REASON FOR CONSULT:High-grade right ICA stenosis in setting of previousrightneck dissection and radiation for tongue cancer  HPI: Lawrence Owen a 63 y.o.male,with history of tongue cancer thatpresents to discuss possible right TCAR. He has been followed by Dr. Donnetta Hutching for his carotid stenosis. As previously noted he had a posterior circulation stroke 8 months ago and at that time work-up revealed asymptomatic bilateral 60% carotid stenosis. He was seen in our office for follow-up and found to have progression of his right carotid stenosis. He also had a CTA neck done that showed high-grade stenosis on the right. He was felt to be a poor candidate for carotid endarterectomy given his previous head and neck dissection as well as radiation. He presents today to discuss possible TCAR. He reports no new neurologic deficits. He is on aspirin Plavix and statin. His right neck dissection/radiation was in 2002 for tongue cancer.      Past Medical History:  Diagnosis Date  . Cancer of tongue [facility])          Past Surgical History:  Procedure Laterality Date  . NECK SURGERY  2003   lymph node removed         Family  History  Problem Relation Age of Onset  . Emphysema Father     SOCIAL HISTORY: Social History        Socioeconomic History  . Marital status: Single    Spouse name: Neoma Laming  . Number of children: 3  . Years of education: 16  . Highest education level: Bachelor's degree (e.g., BA, AB, BS)  Occupational History  . Occupation: retired  Scientific laboratory technician  . Financial resource strain: Not on file  . Food insecurity    Worry: Not on file    Inability: Not on file  . Transportation needs    Medical: Not on file    Non-medical: Not on file  Tobacco Use  . Smoking status: Current Every Day Smoker    Packs/day: 0.25    Years: 29.00    Pack years: 7.25    Types: Cigarettes  . Smokeless tobacco: Never Used  Substance and Sexual Activity  . Alcohol use: Yes    Alcohol/week: 21.0 standard drinks    Types: 21 Cans of beer per week    Comment: 0-3 beers per day  . Drug use: Yes    Frequency: 2.0 times per week    Types: Marijuana  . Sexual activity: Not on file  Lifestyle  . Physical activity    Days per week: Not on file    Minutes per session: Not on file  . Stress: Not on file  Relationships  . Social Herbalist on phone: Not on file    Gets together: Not on file  Attends religious service: Not on file    Active member of club or organization: Not on file    Attends meetings of clubs or organizations: Not on file    Relationship status: Not on file  . Intimate partner violence    Fear of current or ex partner: Not on file    Emotionally abused: Not on file    Physically abused: Not on file    Forced sexual activity: Not on file  Other Topics Concern  . 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 Has a degree in architectural engineering      Patient is right-handed. He lives  with his long time girl-friend in a 2 story house, Restaurant manager, fast food on main level. He drinks 3-4 cups of coffee a day.    No Known Allergies        Current Outpatient Medications  Medication Sig Dispense Refill  . acetaminophen (TYLENOL) 500 MG tablet Take 500 mg by mouth every 6 (six) hours as needed for mild pain or fever.    Marland Kitchen aspirin 81 MG chewable tablet Chew 1 tablet (81 mg total) by mouth daily. 30 tablet 0  . atorvastatin (LIPITOR) 80 MG tablet Take 1 tablet (80 mg total) by mouth daily at 6 PM. 30 tablet 0  . clopidogrel (PLAVIX) 75 MG tablet Take 1 tablet (75 mg total) by mouth daily. 30 tablet 11  . Cyanocobalamin (VITAMIN B-12 PO) Take 1 tablet by mouth daily.    . fluticasone (FLONASE) 50 MCG/ACT nasal spray Place 2 sprays into both nostrils daily. 16 g 2  . folic acid (FOLVITE) 1 MG tablet Take 1 tablet (1 mg total) by mouth daily. 30 tablet 0  . levothyroxine (SYNTHROID, LEVOTHROID) 100 MCG tablet Take 100 mcg by mouth daily before breakfast.    . losartan (COZAAR) 50 MG tablet Take 50 mg by mouth daily.    . Multiple Vitamins-Minerals (EMERGEN-C IMMUNE PO) Take 1 Package by mouth daily.    Marland Kitchen thiamine 100 MG tablet Take 1 tablet (100 mg total) by mouth daily. 30 tablet 0  . TURMERIC PO Take by mouth.     No current facility-administered medications for this visit.    REVIEW OF SYSTEMS: [X]  denotes positive finding, [ ]  denotes negative finding Cardiac  Comments:  Chest pain or chest pressure:    Shortness of breath upon exertion:    Short of breath when lying flat:    Irregular heart rhythm:        Vascular    Pain in calf, thigh, or hip brought on by ambulation:    Pain in feet at night that wakes you up from your sleep:     Blood clot in your veins:    Leg swelling:         Pulmonary    Oxygen at home:    Productive cough:     Wheezing:         Neurologic    Sudden weakness in arms or legs:     Sudden  numbness in arms or legs:     Sudden onset of difficulty speaking or slurred speech:    Temporary loss of vision in one eye:     Problems with dizziness:         Gastrointestinal    Blood in stool:     Vomited blood:  Genitourinary    Burning when urinating:     Blood in urine:        Psychiatric    Major depression:         Hematologic    Bleeding problems:    Problems with blood clotting too easily:        Skin    Rashes or ulcers:        Constitutional    Fever or chills:      PHYSICAL EXAM:    Vitals:   01/15/19 0834  BP: (!) 144/97  Pulse: 70  Resp: 16  Temp: (!) 97.5 F (36.4 C)  TempSrc: Temporal  SpO2: 99%  Weight: 137 lb (62.1 kg)  Height: 5\' 9"  (1.753 m)    GENERAL:The patient is a well-nourished male, in no acute distress. The vital signs are documented above. CARDIOVASCULAR:Prior right neck dissection with extensive surgery on the right neck. 2+ carotid pulse at base of right neck just above clavicle PULMONARY:There is good air exchange  MUSCULOSKELETAL:There are no major deformities or cyanosis. NEUROLOGIC:No focal weakness or paresthesias are detected. SKIN:There are no ulcers or rashes noted. PSYCHIATRIC:The patient has a normal affect.  DATA:  I independently reviewed his CTA neck and this shows a focal greater than 80% stenosis of the proximal right ICA. The left ICA has a 50 to 55% stenosis.  Assessment/Plan:  63 year old male with history of tongue cancer that previously underwentright neck dissection with radiation thatpresents with asymptomatic high-grade right ICA stenosis. Discussed plan for TCAR withstent placement given hostile neck. This will require small incision at the base of the calvicle. The details of the operation were discussed as well as risks and benefits. I did discuss 1% risk of perioperative stroke. He will need to stay on his  aspirin Plavix and statin in the perioperative period.He is scheduled for 01/23/2019.Discussed if neck too scarred for sheath placement, only other option would be transfemoral stent placement with 6% risk of perioperative stroke.   Marty Heck, MD [facility] [place] Office: [phone] Pager: [phone]

## 2019-02-21 NOTE — Plan of Care (Signed)
Continue to monitor

## 2019-02-21 NOTE — Anesthesia Postprocedure Evaluation (Signed)
Anesthesia Post Note  Patient: Lawrence Owen  Procedure(s) Performed: TRANSCAROTID ARTERY REVASCULARIZATION (Right Neck)     Patient location during evaluation: PACU Anesthesia Type: General Level of consciousness: awake and alert Pain management: pain level controlled Vital Signs Assessment: post-procedure vital signs reviewed and stable Respiratory status: spontaneous breathing, nonlabored ventilation and respiratory function stable Cardiovascular status: blood pressure returned to baseline and stable Postop Assessment: no apparent nausea or vomiting Anesthetic complications: no    Last Vitals:  Vitals:   02/21/19 1100 02/21/19 1137  BP: (!) 151/97 (!) 151/97  Pulse: 60 60  Resp: (!) 9 10  Temp:  36.7 C  SpO2: 96% 98%    Last Pain:  Vitals:   02/21/19 1026  TempSrc: Oral  PainSc:                  Audry Pili

## 2019-02-22 ENCOUNTER — Encounter (HOSPITAL_COMMUNITY): Payer: Self-pay | Admitting: Vascular Surgery

## 2019-02-22 LAB — CBC
HCT: 37.1 % — ABNORMAL LOW (ref 39.0–52.0)
Hemoglobin: 12.6 g/dL — ABNORMAL LOW (ref 13.0–17.0)
MCH: 31.6 pg (ref 26.0–34.0)
MCHC: 34 g/dL (ref 30.0–36.0)
MCV: 93 fL (ref 80.0–100.0)
Platelets: 131 10*3/uL — ABNORMAL LOW (ref 150–400)
RBC: 3.99 MIL/uL — ABNORMAL LOW (ref 4.22–5.81)
RDW: 12.2 % (ref 11.5–15.5)
WBC: 11.6 10*3/uL — ABNORMAL HIGH (ref 4.0–10.5)
nRBC: 0 % (ref 0.0–0.2)

## 2019-02-22 LAB — BASIC METABOLIC PANEL
Anion gap: 8 (ref 5–15)
BUN: 12 mg/dL (ref 8–23)
CO2: 24 mmol/L (ref 22–32)
Calcium: 8.5 mg/dL — ABNORMAL LOW (ref 8.9–10.3)
Chloride: 109 mmol/L (ref 98–111)
Creatinine, Ser: 0.93 mg/dL (ref 0.61–1.24)
GFR calc Af Amer: >60 mL/min (ref >60)
GFR calc non Af Amer: >60 mL/min (ref >60)
Glucose, Bld: 131 mg/dL — ABNORMAL HIGH (ref 70–99)
Potassium: 3.5 mmol/L (ref 3.5–5.1)
Sodium: 141 mmol/L (ref 135–145)

## 2019-02-22 MED ORDER — OXYCODONE-ACETAMINOPHEN 5-325 MG PO TABS: 1.0000 | ORAL_TABLET | Freq: Four times a day (QID) | ORAL | 0 refills | Status: DC | PRN

## 2019-02-22 NOTE — Plan of Care (Signed)
Discharged to go home

## 2019-02-22 NOTE — Progress Notes (Addendum)
  Progress Note    02/22/2019 7:35 AM 1 Day Post-Op  Subjective:  Denies stroke like symptoms including slurring speech, changes in vision, or one sided weakness since surgery   Vitals:   02/22/19 0437 02/22/19 0610  BP:  (!) 149/86  Pulse: (!) 51 (!) 48  Resp: 14 12  Temp: 98.2 F (36.8 C)   SpO2: 99% 100%   Physical Exam: Lungs:  Non labored Incisions:  R neck incision c/d/i; L vein cath site without hematoma Extremities:  Moving all extremities well Neurologic: CN grossly intact  CBC    Component Value Date/Time   WBC 11.6 (H) 02/22/2019 0316   RBC 3.99 (L) 02/22/2019 0316   HGB 12.6 (L) 02/22/2019 0316   HCT 37.1 (L) 02/22/2019 0316   PLT 131 (L) 02/22/2019 0316   MCV 93.0 02/22/2019 0316   MCH 31.6 02/22/2019 0316   MCHC 34.0 02/22/2019 0316   RDW 12.2 02/22/2019 0316   LYMPHSABS 1.4 03/03/2018 1525   MONOABS 0.3 03/03/2018 1525   EOSABS 0.1 03/03/2018 1525   BASOSABS 0.0 03/03/2018 1525    BMET    Component Value Date/Time   NA 141 02/22/2019 0316   K 3.5 02/22/2019 0316   CL 109 02/22/2019 0316   CO2 24 02/22/2019 0316   GLUCOSE 131 (H) 02/22/2019 0316   BUN 12 02/22/2019 0316   CREATININE 0.93 02/22/2019 0316   CALCIUM 8.5 (L) 02/22/2019 0316   GFRNONAA >60 02/22/2019 0316   GFRAA >60 02/22/2019 0316    INR    Component Value Date/Time   INR 1.1 02/19/2019 1000     Intake/Output Summary (Last 24 hours) at 02/22/2019 0735 Last data filed at 02/22/2019 0500 Gross per 24 hour  Intake 3751.82 ml  Output 2870 ml  Net 881.82 ml     Assessment/Plan:  63 y.o. male is s/p R TCAR 1 Day Post-Op   Neuro exam at baseline [facility] for discharge this morning; on asa, plavix, statin F/u 4 weeks with carotid duplex   Dagoberto Ligas, PA-C [facility] [phone] 02/22/2019 7:35 AM  I have seen and evaluated the patient. I agree with the PA note as documented above. POD#1 s/p right TCAR.  Doing well.  Neuro intact.  Neck incision  looks good.  Continue aspirin and plavix.  Plan for d/c home today.  F/U in 1 month with carotid duplex.  Marty Heck, MD [facility] [place] Office: [phone] Pager: [phone]

## 2019-02-22 NOTE — Discharge Summary (Signed)
Discharge Summary     Lawrence Owen 1955/10/02 63 y.o. male  [id number]  Admission Date: 02/21/2019  Discharge Date: 02/22/19  Physician: Marty Heck, MD  Admission Diagnosis: right carotid stenosis  Discharge Day services:    see progress note 02/22/19 Physical Exam: Vitals:   02/22/19 0437 02/22/19 0610  BP:  (!) 149/86  Pulse: (!) 51 (!) 48  Resp: 14 12  Temp: 98.2 F (36.8 C)   SpO2: 99% 100%   Hospital Course:  The patient was admitted to the hospital and taken to the operating room on 02/21/2019 and underwent right transcarotid artery revascularization.  The pt tolerated the procedure well and was transported to the PACU in good condition.   By POD 1, the pt neuro status remained at baseline.  The remainder of the hospital course consisted of increasing mobilization and increasing intake of solids without difficulty.   Recent Labs    02/19/19 1000 02/22/19 0316  NA 141 141  K 3.9 3.5  CL 106 109  CO2 26 24  GLUCOSE 89 131*  BUN 10 12  CALCIUM 9.1 8.5*   Recent Labs    02/19/19 1000 02/22/19 0316  WBC 6.0 11.6*  HGB 15.2 12.6*  HCT 45.6 37.1*  PLT 148* 131*   Recent Labs    02/19/19 1000  INR 1.1       Discharge Diagnosis:  right carotid stenosis  Secondary Diagnosis: Patient Active Problem List   Diagnosis Date Noted  . Carotid artery stenosis 02/21/2019  . Carotid stenosis 01/15/2019  . Stroke (cerebrum) ([place]) 03/04/2018  . Alcohol use 03/04/2018  . Essential hypertension 03/04/2018  . Bilateral carotid artery disease ([place]) 03/04/2018  . Essential hypertension, benign 03/12/2010  . ALLERGIC RHINITIS 03/12/2010  . HEADACHE 03/12/2010  . CONTUSION, HEAD 01/27/2009  . HYPERCHOLESTEROLEMIA 10/23/2008  . LOW BACK PAIN SYNDROME 10/23/2008  . GROIN PAIN 10/23/2008  . CAROTID BRUIT, LEFT 09/18/2008  . PERIODONTAL DISEASE 01/03/2008  . DENTAL CARIES 03/13/2007  . Hypothyroidism 02/16/2007  . DISORDER, PERSISTING  AMNESTIC, DRUG-INDUCED 02/16/2007  . DEPRESSION, MAJOR 02/16/2007  . Pulmonary emphysema ([place]) 02/16/2007  . CARCINOMA, SQUAMOUS CELL 05/30/2001   Past Medical History:  Diagnosis Date  . Anxiety   . Cancer of tongue ([place])    neck- lymp  nodes right; treated radiation  . COPD (chronic obstructive pulmonary disease) ([place])   . Hypertension   . Hypothyroidism   . OCD (obsessive compulsive disorder)   . PTSD (post-traumatic stress disorder)   . Stroke [facility])    "mini stoke"   . Tingling of right upper extremity     Allergies as of 02/22/2019   No Known Allergies     Medication List    TAKE these medications   aspirin 81 MG chewable tablet Chew 1 tablet (81 mg total) by mouth daily.   atorvastatin 80 MG tablet Commonly known as: LIPITOR Take 1 tablet (80 mg total) by mouth daily at 6 PM. What changed: when to take this   clopidogrel 75 MG tablet Commonly known as: Plavix Take 1 tablet (75 mg total) by mouth daily.   ECHINACEA PO Take 760 mg by mouth daily.   levothyroxine 100 MCG tablet Commonly known as: SYNTHROID Take 100 mcg by mouth daily before breakfast.   losartan 50 MG tablet Commonly known as: COZAAR Take 50 mg by mouth every evening.   Lubricant Eye Drops 0.4-0.3 % Soln Generic drug: Polyethyl Glycol-Propyl Glycol Place 1 drop into both eyes 3 (  three) times daily as needed (dry/irritated eyes.).   multivitamin with minerals Tabs tablet Take 1 tablet by mouth daily.   Olive Leaf Extract 150 MG Caps Take 150 mg by mouth daily.   oxyCODONE-acetaminophen 5-325 MG tablet Commonly known as: PERCOCET/ROXICET Take 1 tablet by mouth every 6 (six) hours as needed for moderate pain.   Turmeric 500 MG Tabs Take 500 mg by mouth daily. W/Ginger Powder   Vitamin B-12 5000 MCG Tbdp Take 5,000 mcg by mouth daily.        Discharge Instructions:   [facility] Discharge Instructions Carotid Endarterectomy (CEA)  Please  refer to the following instructions for your post-procedure care. Your surgeon or physician assistant will discuss any changes with you.  Activity  You are encouraged to walk as much as you can. You can slowly return to normal activities but must avoid strenuous activity and heavy lifting until your doctor tell you it's OK. Avoid activities such as vacuuming or swinging a golf club. You can drive after one week if you are comfortable and you are no longer taking prescription pain medications. It is normal to feel tired for serval weeks after your surgery. It is also normal to have difficulty with sleep habits, eating, and bowel movements after surgery. These will go away with time.  Bathing/Showering  You may shower after you come home. Do not soak in a bathtub, hot tub, or swim until the incision heals completely.  Incision Care  Shower every day. Clean your incision with mild soap and water. Pat the area dry with a clean towel. You do not need a bandage unless otherwise instructed. Do not apply any ointments or creams to your incision. You may have skin glue on your incision. Do not peel it off. It will come off on its own in about one week. Your incision may feel thickened and raised for several weeks after your surgery. This is normal and the skin will soften over time. For Men Only: It's OK to shave around the incision but do not shave the incision itself for 2 weeks. It is common to have numbness under your chin that could last for several months.  Diet  Resume your normal diet. There are no special food restrictions following this procedure. A low fat/low cholesterol diet is recommended for all patients with vascular disease. In order to heal from your surgery, it is CRITICAL to get adequate nutrition. Your body requires vitamins, minerals, and protein. Vegetables are the best source of vitamins and minerals. Vegetables also provide the perfect balance of protein. Processed food has little  nutritional value, so try to avoid this.  Medications  Resume taking all of your medications unless your doctor or physician assistant tells you not to.  If your incision is causing pain, you may take over-the- counter pain relievers such as acetaminophen (Tylenol). If you were prescribed a stronger pain medication, please be aware these medications can cause nausea and constipation.  Prevent nausea by taking the medication with a snack or meal. Avoid constipation by drinking plenty of fluids and eating foods with a high amount of fiber, such as fruits, vegetables, and grains. Do not take Tylenol if you are taking prescription pain medications.  Follow Up  Our office will schedule a follow up appointment 2-3 weeks following discharge.  Please call us immediately for any of the following conditions  Increased pain, redness, drainage (pus) from your incision site. Fever of 101 degrees or higher. If  you should develop stroke (slurred speech, difficulty swallowing, weakness on one side of your body, loss of vision) you should call 911 and go to the nearest emergency room.  Reduce your risk of vascular disease:  Stop smoking. If you would like help call [facility] at 1-800-QUIT-NOW 302-314-9561) or [place] at (812)163-4373. Manage your cholesterol Maintain a desired weight Control your diabetes Keep your blood pressure down  If you have any questions, please call the office at [phone].  Disposition: home  Patient's condition: is Good  Follow up: 1. Dr. Carlis Abbott in 2 weeks.   Dagoberto Ligas, PA-C [facility] [phone]   --- For [facility] Registry use ---   Modified Rankin score at D/C (0-6): 0  IV medication needed for:  1. Hypertension: No 2. Hypotension: No  Post-op Complications: No  1. Post-op CVA or TIA: No  If yes: Event classification (right eye, left eye, right cortical, left cortical, verterobasilar, other):   If yes: Timing of event  (intra-op, <6 hrs post-op, >=6 hrs post-op, unknown):   2. CN injury: No  If yes: CN  injuried   3. Myocardial infarction: No  If yes: Dx by (EKG or clinical, Troponin):   4.  CHF: No  5.  Dysrhythmia (new): No  6. Wound infection: No  7. Reperfusion symptoms: No  8. Return to OR: No  If yes: return to OR for (bleeding, neurologic, other CEA incision, other):   Discharge medications: Statin use:  Yes ASA use:  Yes   Beta blocker use:  No ACE-Inhibitor use:  No  ARB use:  Yes CCB use: No P2Y12 Antagonist use: Yes, [x ] Plavix, [ ]  Plasugrel, [ ]  Ticlopinine, [ ]  Ticagrelor, [ ]  Other, [ ]  No for medical reason, [ ]  Non-compliant, [ ]  Not-indicated Anti-coagulant use:  No, [ ]  Warfarin, [ ]  Rivaroxaban, [ ]  Dabigatran,

## 2019-02-22 NOTE — Discharge Instructions (Signed)
   [facility] [place]  Discharge Instructions   Carotid Endarterectomy (CEA)  Please refer to the following instructions for your post-procedure care. Your surgeon or physician assistant will discuss any changes with you.  Activity  You are encouraged to walk as much as you can. You can slowly return to normal activities but must avoid strenuous activity and heavy lifting until your doctor tell you it's OK. Avoid activities such as vacuuming or swinging a golf club. You can drive after one week if you are comfortable and you are no longer taking prescription pain medications. It is normal to feel tired for serval weeks after your surgery. It is also normal to have difficulty with sleep habits, eating, and bowel movements after surgery. These will go away with time.  Bathing/Showering  You may shower after you come home. Do not soak in a bathtub, hot tub, or swim until the incision heals completely.  Incision Care  Shower every day. Clean your incision with mild soap and water. Pat the area dry with a clean towel. You do not need a bandage unless otherwise instructed. Do not apply any ointments or creams to your incision. You may have skin glue on your incision. Do not peel it off. It will come off on its own in about one week. Your incision may feel thickened and raised for several weeks after your surgery. This is normal and the skin will soften over time. For Men Only: It's OK to shave around the incision but do not shave the incision itself for 2 weeks. It is common to have numbness under your chin that could last for several months.  Diet  Resume your normal diet. There are no special food restrictions following this procedure. A low fat/low cholesterol diet is recommended for all patients with vascular disease. In order to heal from your surgery, it is CRITICAL to get adequate nutrition. Your body requires vitamins, minerals, and protein. Vegetables are the best  source of vitamins and minerals. Vegetables also provide the perfect balance of protein. Processed food has little nutritional value, so try to avoid this.        Medications  Resume taking all of your medications unless your doctor or physician assistant tells you not to. If your incision is causing pain, you may take over-the- counter pain relievers such as acetaminophen (Tylenol). If you were prescribed a stronger pain medication, please be aware these medications can cause nausea and constipation. Prevent nausea by taking the medication with a snack or meal. Avoid constipation by drinking plenty of fluids and eating foods with a high amount of fiber, such as fruits, vegetables, and grains. Do not take Tylenol if you are taking prescription pain medications.  Follow Up  Our office will schedule a follow up appointment 2-3 weeks following discharge.  Please call us immediately for any of the following conditions  Increased pain, redness, drainage (pus) from your incision site. Fever of 101 degrees or higher. If you should develop stroke (slurred speech, difficulty swallowing, weakness on one side of your body, loss of vision) you should call 911 and go to the nearest emergency room.  Reduce your risk of vascular disease:  Stop smoking. If you would like help call [facility] at 1-800-QUIT-NOW (1-800-784-8669) or [place] at 336-586-4000. Manage your cholesterol Maintain a desired weight Control your diabetes Keep your blood pressure down  If you have any questions, please call the office at [phone].   

## 2019-02-22 NOTE — Progress Notes (Signed)
Nsg Discharge Note  Admit Date:  02/21/2019 Discharge date: 02/22/2019   Lawrence Owen to be D/C' home per MD order.  AVS completed.  Patient/caregiver able to verbalize understanding.  Discharge Medication: Allergies as of 02/22/2019   No Known Allergies     Medication List    TAKE these medications   aspirin 81 MG chewable tablet Chew 1 tablet (81 mg total) by mouth daily.   atorvastatin 80 MG tablet Commonly known as: LIPITOR Take 1 tablet (80 mg total) by mouth daily at 6 PM. What changed: when to take this   clopidogrel 75 MG tablet Commonly known as: Plavix Take 1 tablet (75 mg total) by mouth daily.   ECHINACEA PO Take 760 mg by mouth daily.   levothyroxine 100 MCG tablet Commonly known as: SYNTHROID Take 100 mcg by mouth daily before breakfast. Notes to patient: 02/23/19   losartan 50 MG tablet Commonly known as: COZAAR Take 50 mg by mouth every evening. Notes to patient: 02/22/19   Lubricant Eye Drops 0.4-0.3 % Soln Generic drug: Polyethyl Glycol-Propyl Glycol Place 1 drop into both eyes 3 (three) times daily as needed (dry/irritated eyes.).   multivitamin with minerals Tabs tablet Take 1 tablet by mouth daily. Notes to patient: 02/23/19   Olive Leaf Extract 150 MG Caps Take 150 mg by mouth daily.   oxyCODONE-acetaminophen 5-325 MG tablet Commonly known as: PERCOCET/ROXICET Take 1 tablet by mouth every 6 (six) hours as needed for moderate pain.   Turmeric 500 MG Tabs Take 500 mg by mouth daily. W/Ginger Powder   Vitamin B-12 5000 MCG Tbdp Take 5,000 mcg by mouth daily.       Discharge Assessment: Vitals:   02/22/19 0610 02/22/19 1004  BP: (!) 149/86 (!) 133/95  Pulse: (!) 48 (!) 59  Resp: 12 13  Temp:  98.2 F (36.8 C)  SpO2: 100% 100%   Skin clean, dry and intact without evidence of skin break down, no evidence of skin tears noted. IV catheter discontinued intact. Site without signs and symptoms of complications - no redness or edema  noted at insertion site, patient denies c/o pain - only slight tenderness at site.  Dressing with slight pressure applied.  D/c Instructions-Education: Discharge instructions given to patient/family with verbalized understanding. D/c education completed with patient/family including follow up instructions, medication list, d/c activities limitations if indicated, with other d/c instructions as indicated by MD - patient able to verbalize understanding, all questions fully answered. Patient instructed to return to ED, call 911, or call MD for any changes in condition.  Patient chose not to use a WC and D/C home via private auto.  Lawrence Owen, Jolene Schimke, RN 02/22/2019 11:51 AM

## 2019-07-02 DIAGNOSIS — R269 Unspecified abnormalities of gait and mobility: Secondary | ICD-10-CM | POA: Diagnosis not present

## 2019-07-02 DIAGNOSIS — L98491 Non-pressure chronic ulcer of skin of other sites limited to breakdown of skin: Secondary | ICD-10-CM | POA: Diagnosis not present

## 2019-07-02 DIAGNOSIS — I10 Essential (primary) hypertension: Secondary | ICD-10-CM | POA: Diagnosis not present

## 2019-07-02 DIAGNOSIS — Z720 Tobacco use: Secondary | ICD-10-CM | POA: Diagnosis not present

## 2019-07-02 DIAGNOSIS — J449 Chronic obstructive pulmonary disease, unspecified: Secondary | ICD-10-CM | POA: Diagnosis not present

## 2019-07-03 ENCOUNTER — Ambulatory Visit (INDEPENDENT_AMBULATORY_CARE_PROVIDER_SITE_OTHER): Payer: BC Managed Care – PPO

## 2019-07-03 ENCOUNTER — Other Ambulatory Visit: Payer: Self-pay | Admitting: Family Medicine

## 2019-07-03 ENCOUNTER — Other Ambulatory Visit: Payer: Self-pay

## 2019-07-03 DIAGNOSIS — I69398 Other sequelae of cerebral infarction: Secondary | ICD-10-CM

## 2019-07-03 DIAGNOSIS — R42 Dizziness and giddiness: Secondary | ICD-10-CM

## 2019-07-03 DIAGNOSIS — R519 Headache, unspecified: Secondary | ICD-10-CM | POA: Diagnosis not present

## 2019-07-08 ENCOUNTER — Other Ambulatory Visit: Payer: Self-pay

## 2019-07-08 ENCOUNTER — Encounter: Payer: Self-pay | Admitting: Neurology

## 2019-07-08 ENCOUNTER — Telehealth (INDEPENDENT_AMBULATORY_CARE_PROVIDER_SITE_OTHER): Payer: BC Managed Care – PPO | Admitting: Neurology

## 2019-07-08 VITALS — Ht 69.0 in | Wt 146.0 lb

## 2019-07-08 DIAGNOSIS — F17200 Nicotine dependence, unspecified, uncomplicated: Secondary | ICD-10-CM

## 2019-07-08 DIAGNOSIS — G43109 Migraine with aura, not intractable, without status migrainosus: Secondary | ICD-10-CM

## 2019-07-08 DIAGNOSIS — R42 Dizziness and giddiness: Secondary | ICD-10-CM | POA: Diagnosis not present

## 2019-07-08 DIAGNOSIS — R270 Ataxia, unspecified: Secondary | ICD-10-CM | POA: Diagnosis not present

## 2019-07-08 DIAGNOSIS — E7800 Pure hypercholesterolemia, unspecified: Secondary | ICD-10-CM

## 2019-07-08 DIAGNOSIS — I6389 Other cerebral infarction: Secondary | ICD-10-CM

## 2019-07-08 DIAGNOSIS — I10 Essential (primary) hypertension: Secondary | ICD-10-CM

## 2019-07-08 DIAGNOSIS — I6523 Occlusion and stenosis of bilateral carotid arteries: Secondary | ICD-10-CM

## 2019-07-08 NOTE — Progress Notes (Signed)
Virtual Visit via Video Note The purpose of this virtual visit is to provide medical care while limiting exposure to the novel coronavirus.    Consent was obtained for video visit:  Yes.   Answered questions that patient had about telehealth interaction:  Yes.   I discussed the limitations, risks, security and privacy concerns of performing an evaluation and management service by telemedicine. I also discussed with the patient that there may be a patient responsible charge related to this service. The patient expressed understanding and agreed to proceed.  Pt location: Home Physician Location: office Name of referring provider:  Marda Stalker, PA-C I connected with [name] at patients initiation/request on 07/08/2019 at 10:30 AM EST by video enabled telemedicine application and verified that I am speaking with the correct person using two identifiers. Pt MRN:  [id number] Pt DOB:  02/28/1956 Video Participants:  [name];  His wife   History of Present Illness:  Lawrence Owen a 64 year old right-handed Caucasian man with hypertension, hyperlipidemia, hypothyroidism, carotid artery stenosis, tobacco and alcohol use and history of tongue cancer in 2002 with x-ray therapy to the neck and lymph node dissection who follows up for stroke.She is accompanied by his wife who supplements history.  UPDATE: Current medications:  ASA 81mg , Lipitor 80mg , Plavix 75mg , Cozaar. LDL from 01/03/2019 was 68.  He previously reported balance problems and episodes of dizziness after contracting the flu a year ago.  He started taking his losartan at night instead of in the morning and dizziness seemed to resolved.  He underwent right carotid artery stenting in September.  About a couple of weeks ago, he turned around and suddenly lost his balance and fell softly to the floor (he did not lose his balance).  Since then, he reports increased falling and unstable gait.  He notes a pressure in  his head in the mornings that tends to resolve by late morning.  He has a sensation like being sea-sick.  He has mild spinning sensation with movement.  A couple of times he has felt faint.  His legs feel heavy.  He particularly feels unsteady when he is fatigued or when the lights are off.  He reports a cold sensation in his right foot.  No double vision, slurred speech or unilateral weakness.  CT head on 07/03/2019 personally reviewed and showed chronic right internal capsule infarct but no acute abnormality.  TSH was 1.02.  CBC and CMP were unremarkable.    HISTORY: The patient was admitted to [facility] from 03/03/2018 to 03/04/2018 for stroke and headache. He developed left-sided head pressure with left-sided facial numbness and unsteady gait with leaning towards the left side. MRI of the brain revealed a 12 mm subacute infarction within the right caudate head and anterior limb of the internal capsule. MRA of the head did not reveal aneurysm or large vessel occlusion or stenosis. MRA of the neck revealed bilateral 60% moderate internal carotid artery stenosis. 2D echocardiogram with bubble study demonstrated EF 55-60% with no cardiac source of embolus. He was found to have significant atheromatous plaque with mildly dilated ascending aorta. He was found to exhibit sinus bradycardia. LDL was 134. Hemoglobin A1c was 5.4. He was discharged on aspirin 81 mg, Plavix 75 mg and Lipitor 80 mg daily.  He followed up with Dr. Donnetta Hutching of vascular surgery who did not recommend any interventional treatment of his right internal carotid artery stenosis at this time. It is not clear if the carotid artery  stenosis is due to atherosclerotic disease or possibly radiation therapy but most likely atherosclerotic disease. He recommended monitoring and repeating carotid ultrasound to evaluate for any progression of stenosis.  Repeat carotid duplex from 12/11/18 showed progression with 80-99% stenosis in the  right ICA and 60-79% stenosis in the left ICA with antegrade flow in both vertebrals.  Follow up CTA of neck on 12/31/18 was personally reviewed and confirmed high grade proximal right ICA stenosis (about 80%) as well as 55% left proximal ICA.    He also reports that about a month prior to the stroke, he had brief episodes of altered vision in his right eye. He would notice kaleidoscope vision and white out of vision. There was no associated headache. It occurred off and on for a week. He does have a history of headaches.  Past Medical History: Past Medical History:  Diagnosis Date   Anxiety    Cancer of tongue ([place])    neck- lymp  nodes right; treated radiation   COPD (chronic obstructive pulmonary disease) (HCC)    Hypertension    Hypothyroidism    OCD (obsessive compulsive disorder)    PTSD (post-traumatic stress disorder)    Stroke ([place])    "mini stoke"    Tingling of right upper extremity     Medications: Outpatient Encounter Medications as of 07/08/2019  Medication Sig   aspirin 81 MG chewable tablet Chew 1 tablet (81 mg total) by mouth daily.   atorvastatin (LIPITOR) 80 MG tablet Take 1 tablet (80 mg total) by mouth daily at 6 PM. (Patient taking differently: Take 80 mg by mouth at bedtime. )   clopidogrel (PLAVIX) 75 MG tablet Take 1 tablet (75 mg total) by mouth daily.   Cyanocobalamin (VITAMIN B-12) 5000 MCG TBDP Take 5,000 mcg by mouth daily.    ECHINACEA PO Take 760 mg by mouth daily.   levothyroxine (SYNTHROID, LEVOTHROID) 100 MCG tablet Take 100 mcg by mouth daily before breakfast.   losartan (COZAAR) 50 MG tablet Take 50 mg by mouth every evening.    Multiple Vitamin (MULTIVITAMIN WITH MINERALS) TABS tablet Take 1 tablet by mouth daily.   Olive Leaf Extract 150 MG CAPS Take 150 mg by mouth daily.   oxyCODONE-acetaminophen (PERCOCET/ROXICET) 5-325 MG tablet Take 1 tablet by mouth every 6 (six) hours as needed for moderate pain.   Polyethyl  Glycol-Propyl Glycol (LUBRICANT EYE DROPS) 0.4-0.3 % SOLN Place 1 drop into both eyes 3 (three) times daily as needed (dry/irritated eyes.).   Turmeric 500 MG TABS Take 500 mg by mouth daily. W/Ginger Powder   No facility-administered encounter medications on file as of 07/08/2019.    Allergies: No Known Allergies  Family History: Family History  Problem Relation Age of Onset   Emphysema Father     Social History: Social History   Socioeconomic History   Marital status: Single    Spouse name: Neoma Laming   Number of children: 3   Years of education: 16   Highest education level: Bachelor's degree (e.g., BA, AB, BS)  Occupational History   Occupation: retired  Tobacco Use   Smoking status: Former Smoker    Packs/day: 0.25    Years: 29.00    Pack years: 7.25    Types: Cigarettes   Smokeless tobacco: Never Used   Tobacco comment: Has not smoked in 4 weeks.   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  Has a degree in architectural engineering      Patient is right-handed. He lives with his long time girl-friend in a 2 story house, Restaurant manager, fast food on main level. He drinks 3-4 cups of coffee a day.   Social Determinants of Health   Financial Resource Strain:    Difficulty of Paying Living Expenses: Not on file  Food Insecurity:    Worried About Charity fundraiser in the Last Year: Not on file   [employer] of Food in the Last Year: Not on file  Transportation Needs:    Lack of Transportation (Medical): Not on file    Lack of Transportation (Non-Medical): Not on file  Physical Activity:    Days of Exercise per Week: Not on file   Minutes of Exercise per Session: Not on file  Stress:    Feeling of Stress : Not on file  Social Connections:    Frequency of Communication with Friends and Family: Not on file   Frequency of Social Gatherings with Friends and Family: Not on file   Attends Religious Services: Not on file   Active Member of Clubs or Organizations: Not on file   Attends Archivist Meetings: Not on file   Marital Status: Not on file  Intimate Partner Violence:    Fear of Current or Ex-Partner: Not on file   Emotionally Abused: Not on file   Physically Abused: Not on file   Sexually Abused: Not on file    Observations/Objective:   Height 5\' 9"  (1.753 m), weight 146 lb (66.2 kg). No acute distress.  Alert and oriented.  Speech fluent and not dysarthric.  Language intact.   Assessment and Plan:   1.  Dizziness and ataxia  Due to acute change, will get MRI of brain and MRA of head and neck.   2.  Right subcortical internal capsular infarct secondary to small vessel disease. 3.  Carotid artery disease s/p right stent 4.  Hypertension 5.  Hyperlipidemia 6.  Tobacco use disorder 7.  Ocular migraine  1.  MRI of brain and MRA of head and neck 2.  Will have him follow up in office for exam. 3.  Further recommendations pending results.  Follow Up Instructions:    -I discussed the assessment and treatment plan with the patient. The patient was provided an opportunity to ask questions and all were answered. The patient agreed with the plan and demonstrated an understanding of the instructions.   The patient was advised to call back or seek an in-person evaluation if the symptoms worsen or if the condition fails to improve as anticipated.     Dudley Major, DO

## 2019-07-12 ENCOUNTER — Telehealth: Payer: Self-pay | Admitting: Neurology

## 2019-07-12 ENCOUNTER — Telehealth: Payer: Self-pay

## 2019-07-12 ENCOUNTER — Other Ambulatory Visit: Payer: Self-pay

## 2019-07-12 DIAGNOSIS — I6523 Occlusion and stenosis of bilateral carotid arteries: Secondary | ICD-10-CM

## 2019-07-12 DIAGNOSIS — R270 Ataxia, unspecified: Secondary | ICD-10-CM

## 2019-07-12 DIAGNOSIS — I6389 Other cerebral infarction: Secondary | ICD-10-CM

## 2019-07-12 DIAGNOSIS — R42 Dizziness and giddiness: Secondary | ICD-10-CM

## 2019-07-12 DIAGNOSIS — G43109 Migraine with aura, not intractable, without status migrainosus: Secondary | ICD-10-CM

## 2019-07-12 NOTE — Telephone Encounter (Signed)
Patient has not heard from the Imaging office to set up MRI. He wanted to make sure referral was sent. Also, he has a follow up with Dr. Tomi Likens on 07/25/19 to go over MRI. Patient was also instructed to call [facility] to see if they had referral to schedule. Please Advise. Thank you

## 2019-07-12 NOTE — Telephone Encounter (Signed)
Orders have been placed and patient to contact [facility]

## 2019-07-12 NOTE — Telephone Encounter (Signed)
[facility] called regarding patients imaging orders.  [facility] is not availble to do STAT orders and the MRA of the neck needed to be changed to with and without contrast.  This information was relayed to LPN Alyse Low to update and readdress.

## 2019-07-23 NOTE — Progress Notes (Deleted)
NEUROLOGY FOLLOW UP OFFICE NOTE  Lawrence Owen [id number]  HISTORY OF PRESENT ILLNESS: Lawrence Owen a 64 year old right-handed Caucasian man with hypertension, hyperlipidemia, hypothyroidism, carotid artery stenosis, tobacco and alcohol use and history of tongue cancer in 2002 with x-ray therapy to the neck and lymph node dissection who follows up for stroke.She is accompanied by his wife who supplements history.  UPDATE: Current medications:ASA 81mg , Lipitor 80mg , Plavix 75mg , Cozaar. MRI of brain and MRA of head and neck were ordered but not yet scheduled ***    HISTORY: The patient was admitted to [facility] from 03/03/2018 to 03/04/2018 for stroke and headache. He developed left-sided head pressure with left-sided facial numbness and unsteady gait with leaning towards the left side. MRI of the brain revealed a 12 mm subacute infarction within the right caudate head and anterior limb of the internal capsule. MRA of the head did not reveal aneurysm or large vessel occlusion or stenosis. MRA of the neck revealed bilateral 60% moderate internal carotid artery stenosis. 2D echocardiogram with bubble study demonstrated EF 55-60% with no cardiac source of embolus. He was found to have significant atheromatous plaque with mildly dilated ascending aorta. He was found to exhibit sinus bradycardia. LDL was 134. Hemoglobin A1c was 5.4. He was discharged on aspirin 81 mg, Plavix 75 mg and Lipitor 80 mg daily.  He followed up with Dr. Donnetta Hutching of vascular surgery who did not recommend any interventional treatment of his right internal carotid artery stenosis at this time. It is not clear if the carotid artery stenosis is due to atherosclerotic disease or possibly radiation therapy but most likely atherosclerotic disease. He recommended monitoring and repeating carotid ultrasound to evaluate for any progression of stenosis.  Repeat carotid duplex from 12/11/18 showed progression  with 80-99% stenosis in the right ICA and 60-79% stenosis in the left ICA with antegrade flow in both vertebrals. Follow up CTA of neck on 12/31/18 was personally reviewed and confirmed high grade proximal right ICA stenosis (about 80%) as well as 55% left proximal ICA. He also reports that about a month prior to the stroke, he had brief episodes of altered vision in his right eye. He would notice kaleidoscope vision and white out of vision. There was no associated headache. It occurred off and on for a week. He does have a history of headaches.  He previously reported balance problems and episodes of dizziness after contracting the flu a year ago.  He started taking his losartan at night instead of in the morning and dizziness seemed to resolved.  He underwent right carotid artery stenting in September.  About a couple of weeks ago, he turned around and suddenly lost his balance and fell softly to the floor (he did not lose his balance).  Since then, he reports increased falling and unstable gait.  He notes a pressure in his head in the mornings that tends to resolve by late morning.  He has a sensation like being sea-sick.  He has mild spinning sensation with movement.  A couple of times he has felt faint.  His legs feel heavy.  He particularly feels unsteady when he is fatigued or when the lights are off.  He reports a cold sensation in his right foot.  No double vision, slurred speech or unilateral weakness.  CT head on 07/03/2019 personally reviewed and showed chronic right internal capsule infarct but no acute abnormality.  TSH was 1.02.  CBC and CMP were unremarkable.    PAST  MEDICAL HISTORY: Past Medical History:  Diagnosis Date  . Anxiety   . Cancer of tongue ([place])    neck- lymp  nodes right; treated radiation  . COPD (chronic obstructive pulmonary disease) ([place])   . Hypertension   . Hypothyroidism   . OCD (obsessive compulsive disorder)   . PTSD (post-traumatic stress disorder)   . Stroke  [facility])    "mini stoke"   . Tingling of right upper extremity     MEDICATIONS: Current Outpatient Medications on File Prior to Visit  Medication Sig Dispense Refill  . aspirin 81 MG chewable tablet Chew 1 tablet (81 mg total) by mouth daily. 30 tablet 0  . atorvastatin (LIPITOR) 80 MG tablet Take 1 tablet (80 mg total) by mouth daily at 6 PM. (Patient taking differently: Take 80 mg by mouth at bedtime. ) 30 tablet 0  . clopidogrel (PLAVIX) 75 MG tablet Take 1 tablet (75 mg total) by mouth daily. 30 tablet 11  . Cyanocobalamin (VITAMIN B-12) 5000 MCG TBDP Take 5,000 mcg by mouth daily.     Marland Kitchen ECHINACEA PO Take 760 mg by mouth daily.    Marland Kitchen levothyroxine (SYNTHROID, LEVOTHROID) 100 MCG tablet Take 100 mcg by mouth daily before breakfast.    . losartan (COZAAR) 50 MG tablet Take 50 mg by mouth every evening.     . Multiple Vitamin (MULTIVITAMIN WITH MINERALS) TABS tablet Take 1 tablet by mouth daily.    Jonna Coup Leaf Extract 150 MG CAPS Take 150 mg by mouth daily.    Marland Kitchen oxyCODONE-acetaminophen (PERCOCET/ROXICET) 5-325 MG tablet Take 1 tablet by mouth every 6 (six) hours as needed for moderate pain. (Patient not taking: Reported on 07/08/2019) 15 tablet 0  . Polyethyl Glycol-Propyl Glycol (LUBRICANT EYE DROPS) 0.4-0.3 % SOLN Place 1 drop into both eyes 3 (three) times daily as needed (dry/irritated eyes.).    Marland Kitchen Turmeric 500 MG TABS Take 500 mg by mouth daily. W/Ginger Powder     No current facility-administered medications on file prior to visit.    ALLERGIES: No Known Allergies  FAMILY HISTORY: Family History  Problem Relation Age of Onset  . Emphysema Father     SOCIAL HISTORY: Social History   Socioeconomic History  . Marital status: Single    Spouse name: Neoma Laming  . Number of children: 3  . Years of education: 16  . Highest education level: Bachelor's degree (e.g., BA, AB, BS)  Occupational History  . Occupation: retired  Tobacco Use  . Smoking status: Former Smoker    Packs/day:  0.25    Years: 29.00    Pack years: 7.25    Types: Cigarettes  . Smokeless tobacco: Never Used  . Tobacco comment: Has not smoked in 4 weeks.   Substance and Sexual Activity  . Alcohol use: Not Currently    Alcohol/week: 14.0 standard drinks    Types: 14 Cans of beer per week    Comment: 0-3 beers per day 01/22/2019- none in 2 weeks  . Drug use: Yes    Types: Marijuana    Comment: 01/22/2019- not currents- none in 6 months  . Sexual activity: Not on file  Other Topics Concern  . 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 He lives with his long time girl-friend in a 2  story house, Restaurant manager, fast food on main level. He drinks 3-4 cups of coffee a day.   Social Determinants of Health   Financial Resource Strain:   . Difficulty of Paying Living Expenses: Not on file  Food Insecurity:   . Worried About Charity fundraiser in the Last Year: Not on file  . Ran Out of Food in the Last Year: Not on file  Transportation Needs:   . Lack of Transportation (Medical): Not on file  . Lack of Transportation (Non-Medical): Not on file  Physical Activity:   . Days of Exercise per Week: Not on file  . Minutes of Exercise per Session: Not on file  Stress:   . Feeling of Stress : Not on file  Social Connections:   . Frequency of Communication with Friends and Family: Not on file  . Frequency of Social Gatherings with Friends and Family: Not on file  . Attends Religious Services: Not on file  . Active Member of Clubs or Organizations: Not on file  . Attends Archivist Meetings: Not on file  . Marital Status: Not on file  Intimate Partner Violence:   . Fear of Current or Ex-Partner: Not on file  . Emotionally Abused: Not on file  . Physically Abused: Not on file  . Sexually Abused: Not on file    REVIEW  OF SYSTEMS: Constitutional: No fevers, chills, or sweats, no generalized fatigue, change in appetite Eyes: No visual changes, double vision, eye pain Ear, nose and throat: No hearing loss, ear pain, nasal congestion, sore throat Cardiovascular: No chest pain, palpitations Respiratory:  No shortness of breath at rest or with exertion, wheezes GastrointestinaI: No nausea, vomiting, diarrhea, abdominal pain, fecal incontinence Genitourinary:  No dysuria, urinary retention or frequency Musculoskeletal:  No neck pain, back pain Integumentary: No rash, pruritus, skin lesions Neurological: as above Psychiatric: No depression, insomnia, anxiety Endocrine: No palpitations, fatigue, diaphoresis, mood swings, change in appetite, change in weight, increased thirst Hematologic/Lymphatic:  No purpura, petechiae. Allergic/Immunologic: no itchy/runny eyes, nasal congestion, recent allergic reactions, rashes  PHYSICAL EXAM: *** General: No acute distress.  Patient appears ***-groomed.   Head:  Normocephalic/atraumatic Eyes:  Fundi examined but not visualized Neck: supple, no paraspinal tenderness, full range of motion Heart:  Regular rate and rhythm Lungs:  Clear to auscultation bilaterally Back: No paraspinal tenderness Neurological Exam: alert and oriented to person, place, and time. Attention span and concentration intact, recent and remote memory intact, fund of knowledge intact.  Speech fluent and not dysarthric, language intact.  CN II-XII intact. Bulk and tone normal, muscle strength 5/5 throughout.  Sensation to light touch, temperature and vibration intact.  Deep tendon reflexes 2+ throughout, toes downgoing.  Finger to nose and heel to shin testing intact.  Gait normal, Romberg negative.  IMPRESSION: 1.  Dizziness and ataxia.  Due to acute change, will get MRI of brain and MRA of head and neck. 2.  Right subcortical internal capsular infarct secondary to small vessel disease 3.  Carotid artery  disease s/p right stent 4.  Hypertension 5.  Hyperlipidemia 6.  Tobacco use disorder 7.  Ocular migraine  PLAN: 1.  MRI of brain and MRA of head and neck 2.  ***  Metta Clines, DO  CC: ***

## 2019-07-24 NOTE — Telephone Encounter (Signed)
I left message for patient, orders were placed in chart, unsure of details. I left message for patient to call [facility], cause orders were already in his chart.

## 2019-07-24 NOTE — Telephone Encounter (Signed)
Patient has never received a call from [facility] to schedule an MRI. They called [facility] and were told that they didn't have a referral and to contact our office. His follow up was canceled with Dr. Tomi Likens for 07/25/19 because patient never had MRI. Please Advise. Thanks

## 2019-07-25 ENCOUNTER — Ambulatory Visit: Payer: BC Managed Care – PPO | Admitting: Neurology

## 2020-01-28 DIAGNOSIS — I69398 Other sequelae of cerebral infarction: Secondary | ICD-10-CM | POA: Diagnosis not present

## 2020-01-28 DIAGNOSIS — E039 Hypothyroidism, unspecified: Secondary | ICD-10-CM | POA: Diagnosis not present

## 2020-01-28 DIAGNOSIS — Z720 Tobacco use: Secondary | ICD-10-CM | POA: Diagnosis not present

## 2020-01-28 DIAGNOSIS — J449 Chronic obstructive pulmonary disease, unspecified: Secondary | ICD-10-CM | POA: Diagnosis not present

## 2020-01-28 DIAGNOSIS — R269 Unspecified abnormalities of gait and mobility: Secondary | ICD-10-CM | POA: Diagnosis not present

## 2020-01-28 DIAGNOSIS — Z125 Encounter for screening for malignant neoplasm of prostate: Secondary | ICD-10-CM | POA: Diagnosis not present

## 2020-01-28 DIAGNOSIS — I10 Essential (primary) hypertension: Secondary | ICD-10-CM | POA: Diagnosis not present

## 2021-01-05 ENCOUNTER — Other Ambulatory Visit: Payer: Self-pay | Admitting: Family Medicine

## 2021-01-06 ENCOUNTER — Other Ambulatory Visit: Payer: Self-pay | Admitting: Family Medicine

## 2021-01-06 DIAGNOSIS — R269 Unspecified abnormalities of gait and mobility: Secondary | ICD-10-CM

## 2021-01-14 ENCOUNTER — Ambulatory Visit
Admission: RE | Admit: 2021-01-14 | Discharge: 2021-01-14 | Disposition: A | Discharge status: Home / Self Care | Payer: BC Managed Care – PPO | Source: Ambulatory Visit | Attending: Family Medicine | Admitting: Family Medicine

## 2021-01-14 ENCOUNTER — Other Ambulatory Visit: Payer: Self-pay

## 2021-01-14 DIAGNOSIS — R269 Unspecified abnormalities of gait and mobility: Secondary | ICD-10-CM

## 2021-01-14 MED ORDER — GADOBENATE DIMEGLUMINE 529 MG/ML IV SOLN
14.0000 mL | Freq: Once | INTRAVENOUS | Status: AC | PRN
  Administered 2021-01-14: 14 mL via INTRAVENOUS

## 2021-01-22 NOTE — Progress Notes (Signed)
NEUROLOGY FOLLOW UP OFFICE NOTE  Lawrence Owen [id number]  Assessment/Plan:   Concern for cervical myelopathy  - Will get urgent MRI of cervical spine - Further recommendations pending results.  Subjective:  Lawrence Owen is a 65 year old right-handed Caucasian man with hypertension, hyperlipidemia, hypothyroidism, carotid artery stenosis, tobacco and alcohol use and history of tongue cancer in 2002 with x-ray therapy to the neck and lymph node dissection who follows up for stroke.  She is accompanied by his wife who supplements history.   UPDATE: Current medications:  ASA '81mg'$ , Lipitor '80mg'$ , Plavix '75mg'$ , Cozaar.  Last seen in February 2021 for dizziness.  Patient did not follow up because he did not want to have the MRI due to cost.  Dizziness has persisted as well as seeing spots in his vision.    Patient also presents today for worsening lower extremity weakness.  First he started stubbing his toes.  He has fallen due to the weakness.  Legs feel cold but no pain.  Early this year, hands started tingling and left arm feels numb.  He also reports urinary frequency, hesitancy, sometimes incontinence and difficulty having an erection.  Significantly progressed over the summer. MRI of lumbar spine and sacram on 01/14/2021 personally reviewed showed mild osteoarthritis of the bilateral SI joint and broad-based disc bulge with a small central disc protrusion and reactive edema at L5-S1 but no foraminal or central stenosis.  He also feels more out of breath.   HISTORY:  The patient was admitted to [facility] from 03/03/2018 to 03/04/2018 for stroke and headache.  He developed left-sided head pressure with left-sided facial numbness and unsteady gait with leaning towards the left side.  MRI of the brain revealed a 12 mm subacute infarction within the right caudate head and anterior limb of the internal capsule.  MRA of the head did not reveal aneurysm or large vessel occlusion or  stenosis.  MRA of the neck revealed bilateral 60% moderate internal carotid artery stenosis.  2D echocardiogram with bubble study demonstrated EF 55-60% with no cardiac source of embolus.  He was found to have significant atheromatous plaque with mildly dilated ascending aorta.  He was found to exhibit sinus bradycardia.  LDL was 134.  Hemoglobin A1c was 5.4.  He was discharged on aspirin 81 mg, Plavix 75 mg and Lipitor 80 mg daily.   He followed up with Dr. Donnetta Hutching of vascular surgery who did not recommend any interventional treatment of his right internal carotid artery stenosis at that time.  It is not clear if the carotid artery stenosis is due to atherosclerotic disease or possibly radiation therapy but most likely atherosclerotic disease.  He recommended monitoring and repeating carotid ultrasound to evaluate for any progression of stenosis.  Repeat carotid duplex from 12/11/18 showed progression with 80-99% stenosis in the right ICA and 60-79% stenosis in the left ICA with antegrade flow in both vertebrals.  Follow up CTA of neck on 12/31/18 was personally reviewed and confirmed high grade proximal right ICA stenosis (about 80%) as well as 55% left proximal ICA.     He also reports that about a month prior to the stroke, he had brief episodes of altered vision in his right eye.  He would notice kaleidoscope vision and white out of vision.  There was no associated headache.  It occurred off and on for a week.  He does have a history of headaches.  He previously reported balance problems and episodes of dizziness after contracting  the flu in 2020.  He started taking his losartan at night instead of in the morning and dizziness seemed to resolved.  He underwent right carotid artery stenting in September 2020.  About a couple of weeks ago, he turned around and suddenly lost his balance and fell softly to the floor (he did not lose his balance).  Since then, he reports increased falling and unstable gait.  He  notes a pressure in his head in the mornings that tends to resolve by late morning.  He has a sensation like being sea-sick.  He has mild spinning sensation with movement.  A couple of times he has felt faint.  His legs feel heavy.  He particularly feels unsteady when he is fatigued or when the lights are off.  He reports a cold sensation in his right foot.  No double vision, slurred speech or unilateral weakness.  CT head on 07/03/2019 personally reviewed and showed chronic right internal capsule infarct but no acute abnormality.  TSH was 1.02.  CBC and CMP were unremarkable.   PAST MEDICAL HISTORY: Past Medical History:  Diagnosis Date   Anxiety    Cancer of tongue ([place])    neck- lymp  nodes right; treated radiation   COPD (chronic obstructive pulmonary disease) ([place])    Hypertension    Hypothyroidism    OCD (obsessive compulsive disorder)    PTSD (post-traumatic stress disorder)    Stroke ([place])    "mini stoke"    Tingling of right upper extremity     MEDICATIONS: Current Outpatient Medications on File Prior to Visit  Medication Sig Dispense Refill   aspirin 81 MG chewable tablet Chew 1 tablet (81 mg total) by mouth daily. 30 tablet 0   atorvastatin (LIPITOR) 80 MG tablet Take 1 tablet (80 mg total) by mouth daily at 6 PM. (Patient taking differently: Take 80 mg by mouth at bedtime. ) 30 tablet 0   clopidogrel (PLAVIX) 75 MG tablet Take 1 tablet (75 mg total) by mouth daily. 30 tablet 11   Cyanocobalamin (VITAMIN B-12) 5000 MCG TBDP Take 5,000 mcg by mouth daily.      ECHINACEA PO Take 760 mg by mouth daily.     levothyroxine (SYNTHROID, LEVOTHROID) 100 MCG tablet Take 100 mcg by mouth daily before breakfast.     losartan (COZAAR) 50 MG tablet Take 50 mg by mouth every evening.      Multiple Vitamin (MULTIVITAMIN WITH MINERALS) TABS tablet Take 1 tablet by mouth daily.     Olive Leaf Extract 150 MG CAPS Take 150 mg by mouth daily.     oxyCODONE-acetaminophen (PERCOCET/ROXICET) 5-325 MG  tablet Take 1 tablet by mouth every 6 (six) hours as needed for moderate pain. (Patient not taking: Reported on 07/08/2019) 15 tablet 0   Polyethyl Glycol-Propyl Glycol (LUBRICANT EYE DROPS) 0.4-0.3 % SOLN Place 1 drop into both eyes 3 (three) times daily as needed (dry/irritated eyes.).     Turmeric 500 MG TABS Take 500 mg by mouth daily. W/Ginger Powder     No current facility-administered medications on file prior to visit.    ALLERGIES: No Known Allergies  FAMILY HISTORY: Family History  Problem Relation Age of Onset   Emphysema Father       Objective:  Blood pressure 106/69, pulse 73, height '5\' 9"'$  (1.753 m), weight 152 lb (68.9 kg), SpO2 98 %. General: No acute distress.  Patient appears well-groomed.   Head:  Normocephalic/atraumatic Eyes:  Fundi examined but not visualized Neck: supple,  no paraspinal tenderness, full range of motion Heart:  Regular rate and rhythm Lungs:  Clear to auscultation bilaterally Back: No paraspinal tenderness Neurological Exam: alert and oriented to person, place, and time.  Speech fluent and not dysarthric, language intact.  CN II-XII intact. Bulk and tone normal, muscle strength 2/5 right hip flexion, otherwise 4/5 right lower extremity, 3/5 left hip flexion, otherwise 5/5 throughout.  Sensation to pinprick reduced from feet up to upper chest as well as hands and forearms.  Vibratory sensation intact.  Deep tendon reflexes 3+ throughout, bilateral Babinski.  Finger to nose testing intact.  Non-ambulatory.  In wheelchair.   Metta Clines, DO

## 2021-01-25 ENCOUNTER — Other Ambulatory Visit: Payer: Self-pay

## 2021-01-25 ENCOUNTER — Ambulatory Visit (HOSPITAL_COMMUNITY)
Admission: RE | Admit: 2021-01-25 | Discharge: 2021-01-25 | Disposition: A | Discharge status: Home / Self Care | Payer: Medicare Other | Source: Ambulatory Visit | Attending: Neurology | Admitting: Neurology

## 2021-01-25 ENCOUNTER — Encounter (HOSPITAL_COMMUNITY): Payer: Self-pay

## 2021-01-25 ENCOUNTER — Ambulatory Visit (INDEPENDENT_AMBULATORY_CARE_PROVIDER_SITE_OTHER): Payer: BC Managed Care – PPO | Admitting: Neurology

## 2021-01-25 ENCOUNTER — Telehealth: Payer: Self-pay

## 2021-01-25 ENCOUNTER — Encounter: Payer: Self-pay | Admitting: Neurology

## 2021-01-25 VITALS — BP 106/69 | HR 73 | Ht 69.0 in | Wt 152.0 lb

## 2021-01-25 DIAGNOSIS — G959 Disease of spinal cord, unspecified: Secondary | ICD-10-CM | POA: Diagnosis present

## 2021-01-25 DIAGNOSIS — R292 Abnormal reflex: Secondary | ICD-10-CM

## 2021-01-25 DIAGNOSIS — R29898 Other symptoms and signs involving the musculoskeletal system: Secondary | ICD-10-CM

## 2021-01-25 DIAGNOSIS — R200 Anesthesia of skin: Secondary | ICD-10-CM

## 2021-01-25 DIAGNOSIS — R202 Paresthesia of skin: Secondary | ICD-10-CM

## 2021-01-25 NOTE — Telephone Encounter (Signed)
PA MRI Cervical Spine W/O Contrast  No PA Required.   Ref# [id number].  Appt today at 3 pm pt is to arrive at 2:30 pm. Pt wife advised of appt.

## 2021-01-25 NOTE — Patient Instructions (Signed)
Urgent MRI of cervical spine without contrast Further recommendations pending results.

## 2021-01-26 ENCOUNTER — Other Ambulatory Visit: Payer: BC Managed Care – PPO

## 2021-01-26 ENCOUNTER — Telehealth: Payer: Self-pay

## 2021-01-26 DIAGNOSIS — G959 Disease of spinal cord, unspecified: Secondary | ICD-10-CM

## 2021-01-26 NOTE — Telephone Encounter (Addendum)
MRI Brain Wo contrast as well.   LMOVM Need to order.   Pt wife advised of MRI Cervical spine and new MRI Brain/Thoracic Spine W/O Contrast.  APPt schedule for 02/04/21 at 7 pm arrive at 6:30   PA Not required.   Ref# [id number].  Lmovm with appt and time and location [facility]

## 2021-01-26 NOTE — Telephone Encounter (Signed)
-----   Message from Pieter Partridge, DO sent at 01/26/2021  9:48 AM EDT ----- MRI of cervical spine does not show anything.  Would proceed with MRI of thoracic spine without contrast

## 2021-01-26 NOTE — Telephone Encounter (Signed)
Pt is returning call to sheena. She is home now and can pick up

## 2021-02-04 ENCOUNTER — Ambulatory Visit (HOSPITAL_COMMUNITY)
Admission: RE | Admit: 2021-02-04 | Discharge: 2021-02-04 | Disposition: A | Discharge status: Home / Self Care | Payer: Medicare Other | Source: Ambulatory Visit | Attending: Neurology | Admitting: Neurology

## 2021-02-04 ENCOUNTER — Other Ambulatory Visit: Payer: Self-pay

## 2021-02-04 DIAGNOSIS — G959 Disease of spinal cord, unspecified: Secondary | ICD-10-CM | POA: Insufficient documentation

## 2021-02-08 ENCOUNTER — Telehealth: Payer: Self-pay | Admitting: Neurology

## 2021-02-08 DIAGNOSIS — M6281 Muscle weakness (generalized): Secondary | ICD-10-CM

## 2021-02-08 DIAGNOSIS — R202 Paresthesia of skin: Secondary | ICD-10-CM

## 2021-02-08 DIAGNOSIS — R200 Anesthesia of skin: Secondary | ICD-10-CM

## 2021-02-08 NOTE — Telephone Encounter (Signed)
Pr DR. Jaffe, MRIs do not reveal any cause for weakness.  I would like to check:  1.  labs for muscle weakness - Myasthenia panel with MUSK reflex, CK, aldolase  2.  Nerve conduction study of lower extremities - this will involve needing to give him small shocks in his legs (like a static shock) followed by putting a very small and thin needle in his muscles to see if there is any muscle damage.   Pt wife transferred To front desk to schedule Bilateral  lower EMG.    Labs added. Your provider has requested that you have labwork completed today. Please go to [facility] ([id number]) on the second floor of this building before leaving the office today. You do not need to check in. If you are not called within 15 minutes please check with the front desk.

## 2021-02-08 NOTE — Telephone Encounter (Signed)
Patient's significant other called in and left a message. She wants to find out about the results of the patient's MRI

## 2021-02-09 ENCOUNTER — Other Ambulatory Visit (INDEPENDENT_AMBULATORY_CARE_PROVIDER_SITE_OTHER): Payer: Medicare Other

## 2021-02-09 ENCOUNTER — Other Ambulatory Visit: Payer: Self-pay

## 2021-02-09 DIAGNOSIS — M6281 Muscle weakness (generalized): Secondary | ICD-10-CM | POA: Diagnosis not present

## 2021-02-09 LAB — CK: Total CK: 33 U/L (ref 7–232)

## 2021-02-10 LAB — ALDOLASE: Aldolase: 3 U/L (ref <=8.1)

## 2021-02-17 ENCOUNTER — Other Ambulatory Visit: Payer: Self-pay

## 2021-02-17 ENCOUNTER — Ambulatory Visit (INDEPENDENT_AMBULATORY_CARE_PROVIDER_SITE_OTHER): Payer: Medicare Other | Admitting: Neurology

## 2021-02-17 DIAGNOSIS — M6281 Muscle weakness (generalized): Secondary | ICD-10-CM | POA: Diagnosis not present

## 2021-02-17 NOTE — Procedures (Signed)
[facility]  [address]  [place], [place] [address] Tel: [phone] Fax:  [phone] Test Date:  02/17/2021  Patient: Lawrence Owen DOB: July 06, 1955 Physician: Narda Amber, DO  Sex: Male Height: 5\' 9"  Ref Phys: Metta Clines, D.O.  ID#: [id number]   Technician:    Patient Complaints: This is a 65 year old man referred for evaluation of bilateral leg weakness.  NCV & EMG Findings: Extensive electrodiagnostic testing of the right lower extremity and additional studies of the left shows: Bilateral sural and superficial peroneal sensory responses are within normal limits. Bilateral peroneal and tibial motor responses are within normal limits. Bilateral tibial H reflex studies are within normal limits. There is no evidence of active or chronic motor axonal loss changes affecting any of the tested muscles.  There is a global pattern of incomplete motor unit recruitment as seen by variable activation, which may be due to pain, poor effort, or central disorder of motor unit control.  Impression: There is no evidence of a sensorimotor polyneuropathy, lumbosacral radiculopathy, motor neuron disease, or myopathy affecting the lower extremities.    Of note, testing was somewhat hampered by a global pattern of incomplete motor unit recruitment as seen by variable activation, which may be due to pain, poor effort, or central disorder of motor unit control.   ___________________________ Narda Amber, DO    Nerve Conduction Studies Anti Sensory Summary Table   Stim Site NR Peak (ms) Norm Peak (ms) P-T Amp (V) Norm P-T Amp  Left Sup Peroneal Anti Sensory (Ant Lat Mall)  32C  12 cm    2.3 <4.6 8.4 >3  Right Sup Peroneal Anti Sensory (Ant Lat Mall)  32C  12 cm    3.0 <4.6 9.2 >3  Left Sural Anti Sensory (Lat Mall)  32C  Calf    4.1 <4.6 10.4 >3  Right Sural Anti Sensory (Lat Mall)  32C  Calf    4.3 <4.6 8.9 >3   Motor Summary Table   Stim Site NR Onset (ms)  Norm Onset (ms) O-P Amp (mV) Norm O-P Amp Site1 Site2 Delta-0 (ms) Dist (cm) Vel (m/s) Norm Vel (m/s)  Left Peroneal Motor (Ext Dig Brev)  32C  Ankle    5.6 <6.0 3.4 >2.5 B Fib Ankle 8.7 36.0 41 >40  B Fib    14.3  3.1  Poplt B Fib 1.9 8.0 42 >40  Poplt    16.2  2.8         Right Peroneal Motor (Ext Dig Brev)  32C  Ankle    4.4 <6.0 2.6 >2.5 B Fib Ankle 9.1 36.0 40 >40  B Fib    13.5  2.3  Poplt B Fib 2.0 8.0 40 >40  Poplt    15.5  2.3         Left Peroneal TA Motor (Tib Ant)  32C  Fib Head    3.6 <4.5 4.1 >3 Poplit Fib Head 1.6 8.0 50 >40  Poplit    5.2  3.8         Right Peroneal TA Motor (Tib Ant)  32C  Fib Head    3.8 <4.5 4.2 >3 Poplit Fib Head 1.7 8.0 47 >40  Poplit    5.5  4.2         Left Tibial Motor (Abd Hall Brev)  32C  Ankle    4.2 <6.0 15.1 >4 Knee Ankle 9.6 39.0 41 >40  Knee    13.8  13.6  Right Tibial Motor (Abd Hall Brev)  32C  Ankle    5.0 <6.0 11.7 >4 Knee Ankle 9.6 43.0 45 >40  Knee    14.6  9.6          H Reflex Studies   NR H-Lat (ms) Lat Norm (ms) L-R H-Lat (ms)  Left Tibial (Gastroc)  32C     34.01 <35 0.27  Right Tibial (Gastroc)  32C     33.74 <35 0.27   EMG   Side Muscle Ins Act Fibs Psw Fasc Number Recrt Dur Dur. Amp Amp. Poly Poly. Comment  Right AntTibialis Nml Nml Nml Nml 1- Mod-V Nml Nml Nml Nml Nml Nml N/A  Right Gastroc Nml Nml Nml Nml 1- Mod-V Nml Nml Nml Nml Nml Nml N/A  Right Flex Dig Long Nml Nml Nml Nml 1- Mod-V Nml Nml Nml Nml Nml Nml N/A  Right RectFemoris Nml Nml Nml Nml 2- Mod-V Nml Nml Nml Nml Nml Nml N/A  Right GluteusMed Nml Nml Nml Nml 2- Mod-V Nml Nml Nml Nml Nml Nml N/A  Left AntTibialis Nml Nml Nml Nml 2- Mod-V Nml Nml Nml Nml Nml Nml N/A  Left Gastroc Nml Nml Nml Nml Nml Nml Nml Nml Nml Nml Nml Nml N/A  Left Flex Dig Long Nml Nml Nml Nml 2- Mod-V Nml Nml Nml Nml Nml Nml N/A  Left RectFemoris Nml Nml Nml Nml Nml Nml Nml Nml Nml Nml Nml Nml N/A  Left GluteusMed Nml Nml Nml Nml 2- Mod-V Nml Nml Nml Nml Nml Nml N/A   Left BicepsFemS Nml Nml Nml Nml Nml Nml Nml Nml Nml Nml Nml Nml N/A      Waveforms:

## 2021-02-19 ENCOUNTER — Telehealth: Payer: Self-pay | Admitting: Neurology

## 2021-02-19 NOTE — Telephone Encounter (Signed)
I called and spoke to Marya Amsler and [employer].  I explained that the EMG was normal.  I would like to evaluate for a neurodegenerative disorder, possibly an atypical Parkinson's disease.  He reports that he does have tremors in the hands.  Therefore, we will check a DaTscan.  I answered all questions to the best of my ability.

## 2021-02-22 NOTE — Telephone Encounter (Signed)
Spoke to pt as well as his Spouse, Please come by the office sign consent for DatScan.  Will stop by today to sign.

## 2021-03-04 ENCOUNTER — Other Ambulatory Visit: Payer: Self-pay

## 2021-03-04 DIAGNOSIS — R251 Tremor, unspecified: Secondary | ICD-10-CM

## 2021-03-08 ENCOUNTER — Telehealth: Payer: Self-pay | Admitting: Neurology

## 2021-03-08 NOTE — Telephone Encounter (Signed)
Megan from case management is calling to make sure sheena has recvd the fax regarding the benefit findings/investigation findings. Can call her at [phone]

## 2021-03-10 NOTE — Telephone Encounter (Signed)
Information received

## 2021-03-15 ENCOUNTER — Telehealth: Payer: Self-pay

## 2021-03-15 ENCOUNTER — Telehealth: Payer: Self-pay | Admitting: Neurology

## 2021-03-15 NOTE — Telephone Encounter (Signed)
Tried calling pt, No answer. The just rings with no way to LVM.  Pt needs to call us back or Shanon Brow to schedule the DATscan [phone].

## 2021-03-23 ENCOUNTER — Telehealth: Payer: Self-pay | Admitting: Neurology

## 2021-03-23 NOTE — Telephone Encounter (Signed)
ACh receptor binding antibody and MuSK antibody negative.

## 2021-03-25 ENCOUNTER — Other Ambulatory Visit: Payer: Self-pay

## 2021-03-25 ENCOUNTER — Encounter (HOSPITAL_COMMUNITY)
Admission: RE | Admit: 2021-03-25 | Discharge: 2021-03-25 | Disposition: A | Discharge status: Home / Self Care | Payer: Medicare Other | Source: Ambulatory Visit | Attending: Neurology | Admitting: Neurology

## 2021-03-25 ENCOUNTER — Ambulatory Visit (HOSPITAL_COMMUNITY)
Admission: RE | Admit: 2021-03-25 | Discharge: 2021-03-25 | Disposition: A | Discharge status: Home / Self Care | Payer: Medicare Other | Source: Ambulatory Visit | Attending: Neurology | Admitting: Neurology

## 2021-03-25 DIAGNOSIS — R251 Tremor, unspecified: Secondary | ICD-10-CM | POA: Diagnosis not present

## 2021-03-25 MED ORDER — IOFLUPANE I 123 185 MBQ/2.5ML IV SOLN
4.7000 | Freq: Once | INTRAVENOUS | Status: AC | PRN
  Administered 2021-03-25: 4.7 via INTRAVENOUS
  Filled 2021-03-25: qty 5

## 2021-03-25 MED ORDER — POTASSIUM IODIDE (ANTIDOTE) 130 MG PO TABS: 130.0000 mg | ORAL_TABLET | Freq: Once | ORAL | Status: DC

## 2021-03-25 MED ORDER — POTASSIUM IODIDE (ANTIDOTE) 130 MG PO TABS
ORAL_TABLET | ORAL | Status: AC
  Filled 2021-03-25: qty 1

## 2021-03-30 ENCOUNTER — Telehealth: Payer: Self-pay

## 2021-03-30 DIAGNOSIS — R29898 Other symptoms and signs involving the musculoskeletal system: Secondary | ICD-10-CM

## 2021-03-30 DIAGNOSIS — M6281 Muscle weakness (generalized): Secondary | ICD-10-CM

## 2021-03-30 DIAGNOSIS — R6889 Other general symptoms and signs: Secondary | ICD-10-CM

## 2021-03-30 NOTE — Telephone Encounter (Signed)
-----   Message from Pieter Partridge, DO sent at 03/30/2021  7:32 AM EDT ----- The brain imaging is normal.  I would like to check the following labs:  SPEP/IFE, ceruloplasmin, copper, zinc, HTLV-1, B12, GM 1 antibodies, PTH, TSH, and Lyme.  After these labs are done, I would like him to follow up.  It appears that he does not have a follow up appointment scheduled.  I would like to schedule the follow up in about a week after labs are done to ensure that results are back.  We can use a work-in slot.

## 2021-03-30 NOTE — Telephone Encounter (Signed)
Pt wife advised of DATScan results. And Labs to be drawn f/u a week after.

## 2021-03-31 ENCOUNTER — Other Ambulatory Visit (INDEPENDENT_AMBULATORY_CARE_PROVIDER_SITE_OTHER): Payer: Medicare Other

## 2021-03-31 ENCOUNTER — Other Ambulatory Visit: Payer: Self-pay

## 2021-03-31 DIAGNOSIS — R29898 Other symptoms and signs involving the musculoskeletal system: Secondary | ICD-10-CM

## 2021-03-31 DIAGNOSIS — M6281 Muscle weakness (generalized): Secondary | ICD-10-CM

## 2021-03-31 DIAGNOSIS — R6889 Other general symptoms and signs: Secondary | ICD-10-CM

## 2021-03-31 LAB — VITAMIN B12: Vitamin B-12: 696 pg/mL (ref 211–911)

## 2021-04-06 ENCOUNTER — Other Ambulatory Visit: Payer: Self-pay

## 2021-04-06 ENCOUNTER — Encounter: Payer: Self-pay | Admitting: Neurology

## 2021-04-06 ENCOUNTER — Ambulatory Visit (INDEPENDENT_AMBULATORY_CARE_PROVIDER_SITE_OTHER): Payer: Medicare Other | Admitting: Neurology

## 2021-04-06 VITALS — BP 158/73 | HR 68 | Ht 65.0 in | Wt 150.0 lb

## 2021-04-06 DIAGNOSIS — G1220 Motor neuron disease, unspecified: Secondary | ICD-10-CM

## 2021-04-06 LAB — LYME DISEASE, WESTERN BLOT
IgG P18 Ab.: ABSENT
IgG P23 Ab.: ABSENT
IgG P28 Ab.: ABSENT
IgG P30 Ab.: ABSENT
IgG P39 Ab.: ABSENT
IgG P41 Ab.: ABSENT
IgG P45 Ab.: ABSENT
IgG P58 Ab.: ABSENT
IgG P66 Ab.: ABSENT
IgG P93 Ab.: ABSENT
IgM P23 Ab.: ABSENT
IgM P39 Ab.: ABSENT
IgM P41 Ab.: ABSENT
Lyme IgG Wb: NEGATIVE
Lyme IgM Wb: NEGATIVE

## 2021-04-06 LAB — GM1 IGG AUTOANTIBODIES: GM1 IgG Autoantibodies: <20 % (ref 0–30)

## 2021-04-06 LAB — IMMUNOFIXATION, SERUM
IgA/Immunoglobulin A, Serum: 131 mg/dL (ref 61–437)
IgG (Immunoglobin G), Serum: 1018 mg/dL (ref 603–1613)
IgM (Immunoglobulin M), Srm: 145 mg/dL (ref 20–172)

## 2021-04-06 LAB — INTERPRETATION

## 2021-04-06 NOTE — Progress Notes (Addendum)
NEUROLOGY FOLLOW UP OFFICE NOTE  [name] [id number]  Assessment/Plan:   65 year old male with progressive lower extremity weakness and UMN findings - No abnormalities on neuroaxis to explain symptoms.  NCV-EMG unremarkable without evidence of a demyelinating peripheral neuropathy, myopathy or motor neuron disease.  Even though NCV-EMG was unremarkable, still strong suspicion for motor neuron disease.  In absence of LMN findings, possibly PLS.  GM1 antibodies and HTLV I/II PCR still pending.    Discussed my suspicion for motor neuron disease. I think he needs a power chair.  will have him return for formal assessment and exam. Will order home physical therapy I am going to refer him to establish care with Dr. Narda Amber, our neuromuscular specialist.   Subjective:  Lawrence Owen is a 65 year old right-handed Caucasian man with hypertension, hyperlipidemia, hypothyroidism, carotid artery stenosis, tobacco and alcohol use and history of tongue cancer in 2002 with x-ray therapy to the neck and lymph node dissection, right internal capsular infarct and chronic dizziness who follows up for lower extremity weakness.  She is accompanied by his wife who supplements history.   UPDATE: Current medications:  ASA 81mg , Lipitor 80mg , Plavix 75mg , Cozaar.   On exam in August, he exhibited lower extremity weakness with hyperreflexia and bilateral Babinski.  Unable to walk.  MRI of cervical spine without contrast on 01/25/2021 personally reviewed revealed multilevel cervical spondylosis but no significant spinal stenosis or cord abnormality.  MRI of brain and thoracic spine on 02/04/2021 personally reviewed revealed old right cerebellar infarct and old small infarcts in the thalami, basal ganglia , hemispheric white matter and left occipital region but no acute findings. NCV-EMG of lower extremities on 02/17/2021 was normal but patient exhibited incomplete motor unit recruitment which may be due to  just poor effort or CNS etiology for weakness.  As MRI already showed no explainable cause, he had a DAT scan on 03/25/2021 to evaluate for underlying neurogenic movement disorder for weakness which was within normal limits.  Due to concern for motor neuron disease, he had labs on 03/31/2021 to evaluate for mimics were unremarkable, including copper 113, ceruloplasmin 28, B12 696, Zn 71, PTH 19, CK 33, aldolase 3, negative Lyme IgM/IgG, and SPEP/IFE .  GM1 IgG abs and HTLV 1/II PCR still pending.  Overall, no change.  He feels a little out of breath but nothing significant.  He has some mild trouble with swallowing, but that has been ongoing since his treatment for tongue cancer.  He now reports some vague mild weakness in his hands.  He also has numbness and tingling, which may be carpal tunnel.    HISTORY: In 2022, he has had progressive bilateral lower extremity weakness.  First he started stubbing his toes.  He has fallen due to the weakness.  Legs feel cold but no pain.  Early this year, hands started tingling and left arm feels numb.  He also reports urinary frequency, hesitancy, sometimes incontinence and difficulty having an erection.  Significantly progressed over the summer. MRI of lumbar spine and sacram on 01/14/2021 personally reviewed showed mild osteoarthritis of the bilateral SI joint and broad-based disc bulge with a small central disc protrusion and reactive edema at L5-S1 but no foraminal or central stenosis.    PAST MEDICAL HISTORY: Past Medical History:  Diagnosis Date   Anxiety    Cancer of tongue ([facility])    neck- lymp  nodes right; treated radiation   COPD (chronic obstructive pulmonary disease) ([place])  Hypertension    Hypothyroidism    OCD (obsessive compulsive disorder)    PTSD (post-traumatic stress disorder)    Stroke ([place])    "mini stoke"    Tingling of right upper extremity     MEDICATIONS: Current Outpatient Medications on File Prior to Visit  Medication Sig  Dispense Refill   aspirin 81 MG chewable tablet Chew 1 tablet (81 mg total) by mouth daily. 30 tablet 0   atorvastatin (LIPITOR) 80 MG tablet Take 1 tablet (80 mg total) by mouth daily at 6 PM. (Patient taking differently: Take 80 mg by mouth at bedtime. ) 30 tablet 0   clopidogrel (PLAVIX) 75 MG tablet Take 1 tablet (75 mg total) by mouth daily. 30 tablet 11   Cyanocobalamin (VITAMIN B-12) 5000 MCG TBDP Take 5,000 mcg by mouth daily.      ECHINACEA PO Take 760 mg by mouth daily.     levothyroxine (SYNTHROID, LEVOTHROID) 100 MCG tablet Take 100 mcg by mouth daily before breakfast.     losartan (COZAAR) 50 MG tablet Take 50 mg by mouth every evening.      Multiple Vitamin (MULTIVITAMIN WITH MINERALS) TABS tablet Take 1 tablet by mouth daily.     Olive Leaf Extract 150 MG CAPS Take 150 mg by mouth daily.     oxyCODONE-acetaminophen (PERCOCET/ROXICET) 5-325 MG tablet Take 1 tablet by mouth every 6 (six) hours as needed for moderate pain. (Patient not taking: Reported on 07/08/2019) 15 tablet 0   Polyethyl Glycol-Propyl Glycol (LUBRICANT EYE DROPS) 0.4-0.3 % SOLN Place 1 drop into both eyes 3 (three) times daily as needed (dry/irritated eyes.).     Turmeric 500 MG TABS Take 500 mg by mouth daily. W/Ginger Powder     No current facility-administered medications on file prior to visit.    ALLERGIES: No Known Allergies  FAMILY HISTORY: Family History  Problem Relation Age of Onset   Emphysema Father       Objective:  Blood pressure (!) 158/73, pulse 68, height 5\' 5"  (1.651 m), weight 150 lb (68 kg), SpO2 99 %. General: No acute distress.  Patient appears well-groomed.   Head:  Normocephalic/atraumatic Eyes:  Fundi examined but not visualized Neurological Exam: alert and oriented to person, place, and time.  Speech fluent and not dysarthric, language intact.  CN II-XII intact. No fasciculations of the tongue.  Reduced bulk; muscle strength 2/5 right hip flexion, otherwise 4/5 right lower  extremity, 3/5 left hip flexion, otherwise 5/5 throughout.  No fasciculations appreciated.  Deep tendon reflexes 3+ throughout, bilateral Hoffman, bilateral Babinski.  Non-ambulatory.  In wheelchair.   Metta Clines, DO  CC: Marda Stalker, PA-C

## 2021-04-06 NOTE — Patient Instructions (Signed)
Will have you come back to see me for evaluation to get a power chair Will order home physical therapy Will refer you to my colleague, Dr. Narda Amber, who specializes in ALS and related diseases.

## 2021-04-07 LAB — PROTEIN ELECTROPHORESIS, SERUM
Albumin ELP: 4.1 g/dL (ref 3.8–4.8)
Alpha 1: 0.3 g/dL (ref 0.2–0.3)
Alpha 2: 0.7 g/dL (ref 0.5–0.9)
Beta 2: 0.3 g/dL (ref 0.2–0.5)
Beta Globulin: 0.3 g/dL — ABNORMAL LOW (ref 0.4–0.6)
Gamma Globulin: 1 g/dL (ref 0.8–1.7)
Total Protein: 6.8 g/dL (ref 6.1–8.1)

## 2021-04-07 LAB — HTLV I/II DNA, QUAL. REAL TIME PCR
HTLV-I DNA: NOT DETECTED
HTLV-II DNA: NOT DETECTED

## 2021-04-07 LAB — CERULOPLASMIN: Ceruloplasmin: 28 mg/dL (ref 18–36)

## 2021-04-07 LAB — PARATHYROID HORMONE, INTACT (NO CA): PTH: 19 pg/mL (ref 16–77)

## 2021-04-07 LAB — COPPER, SERUM: Copper: 113 ug/dL (ref 70–175)

## 2021-04-07 LAB — ZINC: Zinc: 71 ug/dL (ref 60–130)

## 2021-04-30 ENCOUNTER — Ambulatory Visit (INDEPENDENT_AMBULATORY_CARE_PROVIDER_SITE_OTHER): Payer: Medicare Other | Admitting: Neurology

## 2021-04-30 ENCOUNTER — Encounter: Payer: Self-pay | Admitting: Neurology

## 2021-04-30 ENCOUNTER — Other Ambulatory Visit: Payer: Self-pay

## 2021-04-30 VITALS — BP 118/77 | HR 94 | Ht 65.0 in | Wt 150.0 lb

## 2021-04-30 DIAGNOSIS — R29898 Other symptoms and signs involving the musculoskeletal system: Secondary | ICD-10-CM

## 2021-04-30 DIAGNOSIS — G1220 Motor neuron disease, unspecified: Secondary | ICD-10-CM | POA: Diagnosis not present

## 2021-04-30 DIAGNOSIS — G1223 Primary lateral sclerosis: Secondary | ICD-10-CM

## 2021-04-30 NOTE — Progress Notes (Signed)
[facility] Clinic Note - Initial Visit   Date: 04/30/21  Lawrence Owen MRN: [id number] DOB: 08/19/1955   Dear [name]:  Thank you for your kind referral of Lawrence Owen for consultation of motor neuron disease. Although his history is well known to you, please allow [place] to reiterate it for the purpose of our medical record. The patient was accompanied to the clinic by partner who also provides collateral information.     History of Present Illness: Lawrence Owen is a 65 y.o. right-handed male with COPD, cancer of the tongue, hypertension, hypothyroidism, OCD, and PTSD presenting for evaluation of motor neuron disease.   Starting two years ago he began having difficulty walking, stating that he could not get his legs to move.  He feels that there is a disconnect between his brain and legs.  He tried using a walker but was falling frequently and has been predominately wheelchair-bound for the past few months.   For the past 6 weeks, he started having fatigue in the arms.  No muscle spasms or twitches. He has been extensively evaluated by my colleague, Dr. Metta Clines who has checked MRI neuroaxis, NCS/EMG of the legs, DAT scan, and serology testing (CK, aldolase, Lyme, Copper, zinc, PTH, GM1, HTLV, SPEP with IFE, B12). All which has been unremarkable.  There was a global pattern of incomplete motor unit activation on EMG concerning for central disorder of motor unit control.  Specifically, no active or chronic motor axon loss.  His exam has shows diffuse hyperreflexia and weakness in the legs. He has some difficulty with swallowing which has been present since his tongue cancer.   He is here for my opinion on MND.  Out-side paper records, electronic medical record, and images have been reviewed where available and summarized as:  NCS/EMG of the legs 02/17/2021: There is no evidence of a sensorimotor polyneuropathy, lumbosacral radiculopathy, motor neuron disease,  or myopathy affecting the lower extremities.     Of note, testing was somewhat hampered by a global pattern of incomplete motor unit recruitment as seen by variable activation, which may be due to pain, poor effort, or central disorder of motor unit control.   MRI cervical spine 01/25/2021: 1. Multilevel degenerative changes of the cervical spine detailed above, most advanced at C5-C6 where there is moderate left worse than right neural foraminal stenosis and at C6-C7 where there is mild-to-moderate right and moderate left neural foraminal stenosis. 2. No significant spinal canal stenosis, and no evidence of cord signal abnormality.   MRI lumbar spine wwo contrast 01/15/2021: 1. At L5-S1 there is a broad-based disc bulge with a small central disc protrusion. Mild bilateral facet arthropathy. No foraminal stenosis. No central canal stenosis. 2.  No acute osseous injury of the lumbar spine.  MRI brain wo contrast 02/07/2021: No acute or reversible finding. 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 Old small vessel infarctions of the thalami, basal ganglia and hemispheric white matter. Tiny old left occipital infarction.   MRI thoracic spine wo contrast 911/2022: 1. Normal thoracic cord. 2. No acute abnormality or significant degenerative changes of the thoracic spine.  DAT scan 03/29/2021: Asymmetric activity within normal shaped striatum is favored within NORMAL LIMITS. No focal loss of dopamine transport activity to suggest Parkinson's syndrome pathology.   Of note, DaTSCAN is not diagnostic of Parkinsonian syndromes, which remains a clinical diagnosis. DaTscan is an adjuvant test to aid in the clinical diagnosis of Parkinsonian syndromes.   Lab  Results  Component Value Date   HGBA1C 5.4 03/04/2018   Lab Results  Component Value Date   MWUXLKGM01 027 03/31/2021   Lab Results  Component Value Date   TSH 4.783 (H) 03/12/2010   No results found for: ESRSEDRATE,  POCTSEDRATE  Past Medical History:  Diagnosis Date   Anxiety    Cancer of tongue ([place])    neck- lymp  nodes right; treated radiation   COPD (chronic obstructive pulmonary disease) ([place])    Hypertension    Hypothyroidism    OCD (obsessive compulsive disorder)    PTSD (post-traumatic stress disorder)    Stroke ([place])    "mini stoke"    Tingling of right upper extremity     Past Surgical History:  Procedure Laterality Date   NECK SURGERY  2003   lymph node removed   TONSILLECTOMY     TRANSCAROTID ARTERY REVASCULARIZATION  Right 02/21/2019   Procedure: TRANSCAROTID ARTERY REVASCULARIZATION;  Surgeon: Marty Heck, MD;  Location: [facility] OR;  Service: Vascular;  Laterality: Right;     Medications:  Outpatient Encounter Medications as of 04/30/2021  Medication Sig   aspirin 81 MG chewable tablet Chew 1 tablet (81 mg total) by mouth daily.   atorvastatin (LIPITOR) 80 MG tablet Take 1 tablet (80 mg total) by mouth daily at 6 PM. (Patient taking differently: Take 80 mg by mouth at bedtime.)   clopidogrel (PLAVIX) 75 MG tablet Take 1 tablet (75 mg total) by mouth daily.   Cyanocobalamin (VITAMIN B-12) 5000 MCG TBDP Take 5,000 mcg by mouth daily.    ECHINACEA PO Take 760 mg by mouth daily.   levothyroxine (SYNTHROID, LEVOTHROID) 100 MCG tablet Take 100 mcg by mouth daily before breakfast.   lisinopril (ZESTRIL) 20 MG tablet Take 20 mg by mouth daily.   losartan (COZAAR) 50 MG tablet Take 50 mg by mouth every evening.    Multiple Vitamin (MULTIVITAMIN WITH MINERALS) TABS tablet Take 1 tablet by mouth daily.   Olive Leaf Extract 150 MG CAPS Take 150 mg by mouth daily.   Polyethyl Glycol-Propyl Glycol (LUBRICANT EYE DROPS) 0.4-0.3 % SOLN Place 1 drop into both eyes 3 (three) times daily as needed (dry/irritated eyes.).   Turmeric 500 MG TABS Take 500 mg by mouth daily. W/Ginger Powder   [DISCONTINUED] oxyCODONE-acetaminophen (PERCOCET/ROXICET) 5-325 MG tablet Take 1 tablet by mouth every  6 (six) hours as needed for moderate pain.   No facility-administered encounter medications on file as of 04/30/2021.    Allergies: No Known Allergies  Family History: Family History  Problem Relation Age of Onset   Emphysema Father     Social History: Social History   Tobacco Use   Smoking status: Former    Packs/day: 0.25    Years: 29.00    Pack years: 7.25    Types: Cigarettes   Smokeless tobacco: Never   Tobacco comments:    Has not smoked in 4 weeks.   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  Has a degree in architectural engineering      Patient is right-handed. He lives with his long time girl-friend in a 2 story house, Restaurant manager, fast food on main level. He drinks 3-4 cups of coffee a day.    Vital Signs:  BP 118/77   Pulse 94   Ht 5\' 5"  (1.651 m)   Wt 150 lb (68 kg)   SpO2 98%   BMI 24.96 kg/m   Neurological Exam: MENTAL STATUS including orientation to time, place, person, recent and remote memory, attention span and concentration, language, and fund of knowledge is normal.  Speech is not dysarthric.  CRANIAL NERVES: II:  No visual field defects.   III-IV-VI: Pupils equal round and reactive to light.  Normal conjugate, extra-ocular eye movements in all directions of gaze.  No nystagmus.  No ptosis.   V:  Normal facial sensation.    VII:  Normal facial symmetry and movements.  Myerson's sign is positive. Negative snout, jaw jerk,and palmomental reflex VIII:  Normal hearing and vestibular function.   IX-X:  Normal palatal movement.    XI:  Normal shoulder shrug and head rotation.   XII:  Mild tongue weakness (5-/5)  Normal range of motion, no deviation or fasciculation.  MOTOR:  No atrophy, fasciculations or abnormal movements.  No pronator drift.   Upper Extremity:  Right  Left  Deltoid  5/5   5/5   Biceps  5/5   5/5   Triceps  5/5   5/5   Infraspinatus 5/5  5/5  Medial pectoralis 5/5  5/5  Wrist extensors  5/5   5/5   Wrist flexors  5/5   5/5   Finger extensors  5/5   5/5   Finger flexors  5/5   5/5   Dorsal interossei  5/5   5/5   Abductor pollicis  5/5   5/5   Tone (Ashworth scale)  0  0   Lower Extremity:  Right  Left  Hip flexors  3/5   3/5   Hip extensors  4/5   4/5   Adductor 4/5  4/5  Abductor 4/5  4/5  Knee flexors  5/5   5/5   Knee extensors  5/5   5/5   Dorsiflexors  3/5   3/5   Plantarflexors  3/5   3/5   Toe extensors  3/5   3/5   Toe flexors  3/5   3/5   Tone (Ashworth scale)  0+  0+   MSRs:  Right        Left                  brachioradialis 2+  2+  biceps 2+  2+  triceps 2+  2+  patellar 3+  3+  ankle jerk 3+  3+  Hoffman no  no  plantar response up  up  Bilateral ankle clonus  SENSORY:  Normal and symmetric perception of light touch, pinprick, vibration,  COORDINATION/GAIT: Normal finger-to- nose-finger.  Intact rapid alternating movements bilaterally.  Unable to assess gait, patient in wheelchair   IMPRESSION: Progressive bilateral leg weakness, now non-ambulatory. Exam with prominent upper motor neuron findings in the lower extremities. 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 EMG did not show lower motor pathology.  Diagnostic work-up discussed at length. Unfortunately, management of PLS is supportive.  PLAN/RECOMMENDATIONS:  Start home PT Referral for power chair assessment will  be sent I also offered a second opinion from neuromuscular specialist to be sure were not overlooking anything in his care.  He would like to proceed with this.  Return to clinic in 3 months.  Total time spent reviewing records, interview, history/exam, documentation, and coordination of care on day of encounter:  60 min   Thank you for allowing me to participate in patient's care.  If I can answer any additional questions, I would be pleased to do so.    Sincerely,    Steve Youngberg K. Posey Pronto, DO

## 2021-04-30 NOTE — Patient Instructions (Signed)
Start home therapy. We will send a referral with power chair assessment  We will refer you to [facility] for second opinion

## 2021-05-12 ENCOUNTER — Telehealth: Payer: Self-pay

## 2021-05-12 NOTE — Telephone Encounter (Signed)
Called patient and left a message for a call back. Need to provide patient with [place] number: [phone]. Patient needs to return call for Home health services in order to have a wheelchair assessment. Referrals have been sent. Per Hoyle Sauer at [facility] they have called patient multiple times and no call back was received by patient.

## 2021-06-03 ENCOUNTER — Telehealth: Payer: Self-pay | Admitting: Neurology

## 2021-06-03 DIAGNOSIS — G1220 Motor neuron disease, unspecified: Secondary | ICD-10-CM

## 2021-06-03 NOTE — Telephone Encounter (Signed)
Updated home pt order

## 2021-06-03 NOTE — Telephone Encounter (Signed)
Hilda Blades called and said [facility] needs a new referral sent over for a consultation. The last one expired.

## 2021-06-28 ENCOUNTER — Telehealth: Payer: Self-pay | Admitting: Neurology

## 2021-06-28 NOTE — Telephone Encounter (Signed)
Candise Bowens RN from [facility] called regarding patients appt. She has not been able to get in touch with pt to schedule. She has left messages with no luck. She wasn't sure if patel knew of another way to get in touch with him, she just wanted to let her know. She will try by sending a letter in the mail. If we get a hold of him, we can give him her number to get him scheduled [phone]

## 2021-06-29 NOTE — Telephone Encounter (Signed)
Called patients home, patients cell, and patients spouse. Unable to leave messages as mail box is full.

## 2021-06-29 NOTE — Telephone Encounter (Signed)
I remember PT having problems reaching this patient as well. Can you try to reach his significant other Neoma Laming) and see if they still want to proceed with second opinion?  Please provide the number for them to call.

## 2021-07-01 NOTE — Telephone Encounter (Signed)
Called Nicole at [facility] and left a message informing her that we have tried to contact patient and have been unsuccessful. Informed Elmyra Ricks to close out referral.

## 2021-07-13 ENCOUNTER — Ambulatory Visit: Payer: BC Managed Care – PPO | Admitting: Neurology

## 2021-08-02 ENCOUNTER — Ambulatory Visit: Payer: Medicare Other | Admitting: Neurology

## 2021-08-03 ENCOUNTER — Telehealth: Payer: Self-pay | Admitting: Neurology

## 2021-08-03 NOTE — Telephone Encounter (Signed)
Called and spoke to patients partner Neoma Laming and informed her that PT verbal orders have been provided to Tish at [facility].  ? ?Neoma Laming verbalized understanding and had no further questions or concerns. ?

## 2021-08-03 NOTE — Telephone Encounter (Signed)
Received voicemail from [place] therapist with [facility] called wanting verbal orders to resume PT. Tish is requesting extension of once a week for 2 more weeks. ? ?Spoke to Dr. Posey Pronto and she has ok'd the continuation of PT.  ? ?Called and provided her with verbal orders to [place].  ?

## 2021-08-03 NOTE — Telephone Encounter (Signed)
Left message with access nurse.  Caller would like someone to call [facility] regarding an order for physical therapy.  Lawrence Owen states the patients physical therapist has been trying to contact our office. ? ?Access nurse is in the [place] ?

## 2021-08-03 NOTE — Telephone Encounter (Signed)
Patient's partner Neoma Laming called stating [facility] has been trying to get more orders for the patient to continue home PT but states they have not received a response from the office. ?

## 2022-01-17 ENCOUNTER — Encounter: Payer: Self-pay | Admitting: Neurology

## 2022-01-17 ENCOUNTER — Ambulatory Visit (INDEPENDENT_AMBULATORY_CARE_PROVIDER_SITE_OTHER): Payer: Medicare Other | Admitting: Neurology

## 2022-01-17 VITALS — BP 132/92 | HR 74 | Ht 65.0 in | Wt 135.0 lb

## 2022-01-17 DIAGNOSIS — G959 Disease of spinal cord, unspecified: Secondary | ICD-10-CM | POA: Diagnosis not present

## 2022-01-17 DIAGNOSIS — R29898 Other symptoms and signs involving the musculoskeletal system: Secondary | ICD-10-CM

## 2022-01-17 NOTE — Patient Instructions (Addendum)
Start physical therapy   Home occupation therapy power chair assessment  Return to clinic in 6 months

## 2022-01-17 NOTE — Progress Notes (Unsigned)
Follow-up Visit   Date: 01/17/2022    Lawrence Owen MRN: [id number] DOB: [birth year]    Lawrence Owen is a 66 y.o. right-handed male with COPD, cancer of the tongue, hypertension, hypothyroidism, OCD, and PTSD returning to the clinic for follow-up of progressive weakness of the lower >> upper extremities, concerning for radiation-induced myelopathy vs PLS.  The patient was accompanied to the clinic by partner who also provides collateral information.    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  Evaluated at [facility] who favored radiation-induced myelopathy given that he also has sensory complaints in the upper extremities, in addition to prominent UMN findings. Management remains supportive. Out-patient PT referral for leg strengthening For lightheadedness, encouraged hydration and compression socks as this may be more orthostasis  Patient would like power wheelchair to be able to do outdoor activites, we will send a referral to home OT to initiate this process Patient and caregiver with multiple questions regarding other medical complaints including chronic cough, chest discomfort, hematuria, and I have asked him to follow-up with PCP  Return to clinic in 6 months  --------------------------------------------- History of present illness: Starting ~2021, he began having difficulty walking, stating that he could not get his legs to move.  He feels that there is a disconnect between his brain and legs.  He tried using a walker but was falling frequently and has been predominately wheelchair-bound for the past few months.   For the past 6 weeks, he started having fatigue in the arms.  No muscle spasms or twitches. He has been extensively evaluated by my colleague, Dr. Metta Clines who has checked MRI neuroaxis, NCS/EMG of the legs, DAT scan, and serology testing (CK, aldolase, Lyme, Copper, zinc, PTH, GM1, HTLV, SPEP with  IFE, B12). All which has been unremarkable.  There was a global pattern of incomplete motor unit activation on EMG concerning for central disorder of motor unit control.  Specifically, no active or chronic motor axon loss.  His exam has shows diffuse hyperreflexia and weakness in the legs. He has some difficulty with swallowing which has been present since his tongue cancer.    UPDATE 01/18/2022:  He was evaluated by neuromuscular center at [facility] who reviewed his work-up and raised concern that his symptoms could be secondary to radiation-induced myelopathy. He denies any new weakness of the arms or leg.  No speech/swallow difficulty, muscle twitches, or cramps. He remains non-ambulatory. He enjoys being outdoors and is inquiring about getting an all-terrain power chair.    He has noticed new lightheadedness which is triggered by positional changes.  He wears a condom catheter when he is outside of the home and has noticed occasional blood in urine.    Medications:  Current Outpatient Medications on File Prior to Visit  Medication Sig Dispense Refill   aspirin 81 MG chewable tablet Chew 1 tablet (81 mg total) by mouth daily. 30 tablet 0   atorvastatin (LIPITOR) 80 MG tablet Take 1 tablet (80 mg total) by mouth daily at 6 PM. (Patient taking differently: Take 80 mg by mouth at bedtime.) 30 tablet 0   clopidogrel (PLAVIX) 75 MG tablet Take 1 tablet (75 mg total) by mouth daily. 30 tablet 11   Cyanocobalamin (VITAMIN B-12) 5000 MCG TBDP Take 5,000 mcg by mouth daily.      ECHINACEA PO Take 760 mg by mouth daily.     levothyroxine (SYNTHROID, LEVOTHROID) 100 MCG tablet Take 100 mcg by  mouth daily before breakfast.     lisinopril (ZESTRIL) 20 MG tablet Take 20 mg by mouth daily.     losartan (COZAAR) 50 MG tablet Take 50 mg by mouth every evening.      Multiple Vitamin (MULTIVITAMIN WITH MINERALS) TABS tablet Take 1 tablet by mouth daily.     Olive Leaf Extract 150 MG CAPS Take 150 mg by mouth  daily.     Polyethyl Glycol-Propyl Glycol (LUBRICANT EYE DROPS) 0.4-0.3 % SOLN Place 1 drop into both eyes 3 (three) times daily as needed (dry/irritated eyes.).     Turmeric 500 MG TABS Take 500 mg by mouth daily. W/Ginger Powder     No current facility-administered medications on file prior to visit.    Allergies: No Known Allergies  Vital Signs:  BP (!) 132/92   Pulse 74   Ht '5\' 5"'$  (1.651 m)   Wt 135 lb (61.2 kg)   SpO2 100%   BMI 22.47 kg/m   Neurological Exam: MENTAL STATUS including orientation to time, place, person, recent and remote memory, attention span and concentration, language, and fund of knowledge is normal.  Speech is not dysarthric.  CRANIAL NERVES:  No visual field defects.  Pupils equal round and reactive to light.  Normal conjugate, extra-ocular eye movements in all directions of gaze. Lateral gaze torsional nystagmus bilaterally. No ptosis.  Face is symmetric. Palate elevates symmetrically.  Tongue is midline, no weakness or fasciculations.  MOTOR:  Bilateral leg atrophy, fasciculations or abnormal movements.  No pronator drift.   Upper Extremity:  Right  Left  Deltoid  5/5   5/5   Biceps  5/5   5/5   Triceps  5/5   5/5   Infraspinatus 5/5  5/5  Medial pectoralis 5/5  5/5  Wrist extensors  5/5   5/5   Wrist flexors  5/5   5/5   Finger extensors  5/5   5/5   Finger flexors  5/5   5/5   Dorsal interossei  5/5   5/5   Abductor pollicis  5/5   5/5   Tone (Ashworth scale)  0  0   Lower Extremity:  Right  Left  Hip flexors  3/5   3/5   Knee flexors  4/5   4/5   Knee extensors  4/5   4/5   Dorsiflexors  3/5   3/5   Plantarflexors  3/5   3/5   Tone (Ashworth scale)  0  0   MSRs:                                           Right        Left brachioradialis 2+  2+  biceps 2+  2+  triceps 2+  2+  patellar 3+  3+  ankle jerk 3+  3+  Hoffman no  no  plantar response up  up   SENSORY:  Intact to vibration throughout.  COORDINATION/GAIT:   Nonambulatory, here in wheelchair  Data: NCS/EMG of the legs 02/17/2021: There is no evidence of a sensorimotor polyneuropathy, lumbosacral radiculopathy, motor neuron disease, or myopathy affecting the lower extremities.     Of note, testing was somewhat hampered by a global pattern of incomplete motor unit recruitment as seen by variable activation, which may be due to pain, poor effort, or central disorder of motor unit control.   MRI  cervical spine 01/25/2021: 1. Multilevel degenerative changes of the cervical spine detailed above, most advanced at C5-C6 where there is moderate left worse than right neural foraminal stenosis and at C6-C7 where there is mild-to-moderate right and moderate left neural foraminal stenosis. 2. No significant spinal canal stenosis, and no evidence of cord signal abnormality.   MRI lumbar spine wwo contrast 01/15/2021: 1. At L5-S1 there is a broad-based disc bulge with a small central disc protrusion. Mild bilateral facet arthropathy. No foraminal stenosis. No central canal stenosis. 2.  No acute osseous injury of the lumbar spine.   MRI brain wo contrast 02/07/2021: No acute or reversible finding. 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 Old small vessel infarctions of the thalami, basal ganglia and hemispheric white matter. Tiny old left occipital infarction.   MRI thoracic spine wo contrast 911/2022: 1. Normal thoracic cord. 2. No acute abnormality or significant degenerative changes of the thoracic spine.   DAT scan 03/29/2021: Asymmetric activity within normal shaped striatum is favored within NORMAL LIMITS. No focal loss of dopamine transport activity to suggest Parkinson's syndrome pathology.   Of note, DaTSCAN is not diagnostic of Parkinsonian syndromes, which remains a clinical diagnosis. DaTscan is an adjuvant test to aid in the clinical diagnosis of Parkinsonian syndromes.  Total time spent reviewing records, interview, history/exam,  documentation, and coordination of care on day of encounter:  40 min   Thank you for allowing me to participate in patient's care.  If I can answer any additional questions, I would be pleased to do so.    Sincerely,    Tylique Aull K. Posey Pronto, DO

## 2022-01-26 ENCOUNTER — Ambulatory Visit: Payer: Medicare Other | Admitting: Physical Therapy

## 2022-01-26 ENCOUNTER — Ambulatory Visit: Payer: Medicare Other | Attending: Neurology

## 2022-01-26 DIAGNOSIS — G959 Disease of spinal cord, unspecified: Secondary | ICD-10-CM | POA: Diagnosis not present

## 2022-01-26 DIAGNOSIS — R2681 Unsteadiness on feet: Secondary | ICD-10-CM

## 2022-01-26 DIAGNOSIS — M6281 Muscle weakness (generalized): Secondary | ICD-10-CM

## 2022-01-26 DIAGNOSIS — R2689 Other abnormalities of gait and mobility: Secondary | ICD-10-CM

## 2022-01-26 DIAGNOSIS — R29898 Other symptoms and signs involving the musculoskeletal system: Secondary | ICD-10-CM | POA: Diagnosis present

## 2022-01-26 NOTE — Therapy (Signed)
OUTPATIENT PHYSICAL THERAPY NEURO EVALUATION   Patient Name: Lawrence Owen MRN: [id number] DOB:02-Nov-1955, 66 y.o., male Today's Date: 01/26/2022   PCP: Marda Stalker REFERRING PROVIDER: Alda Berthold, DO    PT End of Session - 01/26/22 1145     Visit Number 1    Number of Visits 12    Date for PT Re-Evaluation 03/09/22    Authorization Type Medicare/Medicaid    PT Start Time 7425    PT Stop Time 1230    PT Time Calculation (min) 45 min    Activity Tolerance Patient tolerated treatment well    Behavior During Therapy WFL for tasks assessed/performed             Past Medical History:  Diagnosis Date   Anxiety    Cancer of tongue ([place])    neck- lymp  nodes right; treated radiation   COPD (chronic obstructive pulmonary disease) ([place])    Hypertension    Hypothyroidism    OCD (obsessive compulsive disorder)    PTSD (post-traumatic stress disorder)    Stroke ([place])    "mini stoke"    Tingling of right upper extremity    Past Surgical History:  Procedure Laterality Date   NECK SURGERY  2003   lymph node removed   TONSILLECTOMY     TRANSCAROTID ARTERY REVASCULARIZATION  Right 02/21/2019   Procedure: TRANSCAROTID ARTERY REVASCULARIZATION;  Surgeon: Marty Heck, MD;  Location: [place];  Service: Vascular;  Laterality: Right;   Patient Active Problem List   Diagnosis Date Noted   Carotid artery stenosis 02/21/2019   Carotid stenosis 01/15/2019   Stroke (cerebrum) ([place]) 03/04/2018   Alcohol use 03/04/2018   Essential hypertension 03/04/2018   Bilateral carotid artery disease ([place]) 03/04/2018   Essential hypertension, benign 03/12/2010   ALLERGIC RHINITIS 03/12/2010   HEADACHE 03/12/2010   CONTUSION, HEAD 01/27/2009   HYPERCHOLESTEROLEMIA 10/23/2008   LOW BACK PAIN SYNDROME 10/23/2008   GROIN PAIN 10/23/2008   CAROTID BRUIT, LEFT 09/18/2008   PERIODONTAL DISEASE 01/03/2008   DENTAL CARIES 03/13/2007   Hypothyroidism 02/16/2007   DISORDER,  PERSISTING AMNESTIC, DRUG-INDUCED 02/16/2007   DEPRESSION, MAJOR 02/16/2007   Pulmonary emphysema ([place]) 02/16/2007   CARCINOMA, SQUAMOUS CELL 05/30/2001    ONSET DATE: since 2021  REFERRING DIAG: R29.898 (ICD-10-CM) - Weakness of both lower extremities G95.9 (ICD-10-CM) - Myelopathy ([place])   THERAPY DIAG:  Muscle weakness (generalized)  Other abnormalities of gait and mobility  Unsteadiness on feet  Other symptoms and signs involving the musculoskeletal system  Rationale for Evaluation and Treatment Rehabilitation  SUBJECTIVE:  SUBJECTIVE STATEMENT: Pt had been experiencing gradual onset of LE weakness and noticing increased issues with walking, balance, and progressing to UE weakness which MD attributes to Radiation Induced Myelopathy. Notes that his right feels weaker than left.  Pt accompanied by: significant other  PERTINENT HISTORY: History of present illness: Starting ~2021, he began having difficulty walking, stating that he could not get his legs to move.  He feels that there is a disconnect between his brain and legs.  He tried using a walker but was falling frequently and has been predominately wheelchair-bound for the past few months.   For the past 6 weeks, he started having fatigue in the arms.  No muscle spasms or twitches  Myelopathy - Ddx: radiation-induced myelopathy vs primary lateral sclerosis. Evaluated at [facility] who favored radiation-induced myelopathy given that he also has sensory complaints in the upper extremities, in addition to prominent UMN findings    PAIN:  Are you having pain? No, feeling of chronic low back pain, worse when standing and extending, e.g. reaching overhead  PRECAUTIONS: Fall  WEIGHT BEARING RESTRICTIONS No  FALLS: Has patient  fallen in last 6 months? No  LIVING ENVIRONMENT: Lives with: lives with their spouse Lives in: House/apartment Stairs: Yes: Internal: 12 steps; ramp to enter/exit home Has following equipment at home: Wheelchair (power), Wheelchair (manual), and shower chair  PLOF: Needs assistance with ADLs, Needs assistance with homemaking, and Needs assistance with transfers  PATIENT GOALS be able to stand long/safely enough to perform activities like washing dishes and reaching coffee cups  OBJECTIVE:   DIAGNOSTIC FINDINGS:  IMPRESSION: Asymmetric activity within normal shaped striatum is favored within Strong. No focal loss of dopamine transport activity to suggest Parkinson's syndrome pathology.  IMPRESSION: 1. Normal thoracic cord. 2. No acute abnormality or significant degenerative changes of the thoracic spine.  IMPRESSION: No acute or reversible finding. 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 Old small vessel infarctions of the thalami, basal ganglia and hemispheric white matter. Tiny old left occipital infarction.  COGNITION: Overall cognitive status: Within functional limits for tasks assessed   SENSATION: WFL, grossly  COORDINATION: Alt toe taps impaired, heel to shin limited by weakness and coordination defiicts  EDEMA:  none  MUSCLE TONE: unremarkable, very emaciated appearance, very little subcutaneous fat      MUSCLE LENGTH: WFL, tight to straight leg raise position with strong passive resistance at 35-50 degrees  DTRs:  Bilateral clonus, 2-4 beat  POSTURE: slumped, sacral sitting--not fixed, able to correct with cues, slight decrease in lumbar lordosis appreciated  LOWER EXTREMITY ROM:    WFL  (Blank rows = not tested)  LOWER EXTREMITY MMT:    MMT Right Eval Left Eval  Hip flexion 3 3  Hip extension    Hip abduction 3- 3-  Hip adduction 3 3  Hip internal rotation    Hip external rotation    Knee flexion 3 3  Knee  extension 3+ 3+  Ankle dorsiflexion 2- 2  Ankle plantarflexion 2+ 2+  Ankle inversion    Ankle eversion    (Blank rows = not tested)  Trunk flexion: 2+/5 UE: 5/5 resisted tests.  Able to perform bodyweight dip with RW (no LE contribution)  BED MOBILITY:  Modified indep for supine<>sit Need to check rolling on larger mat table  TRANSFERS: Performs squat-pivot w/c to EOM with modified indep. Stand-pivot with RW and SBA-CGA    CURB:  DNT due to safety  STAIRS:  Pt  has been non-ambulatory since 2022  GAIT: Pt has been non-ambulatory since Aug 2022  FUNCTIONAL TESTs:  Static standing: dependence on BUE   PATIENT EDUCATION: Education details: postural awareness and sitting with lumbar support and upright posture. Also in monitoring orthostatic hypotension Person educated: Patient and Spouse Education method: Explanation, Demonstration, and Tactile cues Education comprehension: verbalized understanding and needs further education   HOME EXERCISE PROGRAM: Will initiate    GOALS: Goals reviewed with patient? Yes  SHORT TERM GOALS: Target date: 02/16/2022  Patient will be independent in HEP to improve functional outcomes Baseline: Goal status: INITIAL  2.  Patient will be able to tolerate (supported) prone position x 5 min to improve lumbar extension and progress for posterior chain strengthening Baseline: unable per report, need to trial rolling on mat table to see Goal status: INITIAL  3.  Maintain supported standing x 5 min to improve participation in housekeeping and ADL participation Baseline: 1-3 min w/ BUE support on RW Goal status: INITIAL    LONG TERM GOALS: Target date: 03/09/2022  Improve trunk flexion strength to 3/5, hip flexion strength 3+/5, and lumbar extension strength 3/5 to facilitate lumbar stability and reduce pain Baseline:  Goal status: INITIAL  2.  Demonstrate ability to stand x 5 min and perform unilateral reaching/overhead 1x15 reps  left/right in order to improve participation and safety with washing dishes Baseline:  Goal status: INITIAL   ASSESSMENT:  CLINICAL IMPRESSION: Patient is a 66y.o. male who was seen today for physical therapy evaluation and treatment for various issues and limitations. Demonstrates generalized weakness and decline in functional independence and mobility with inability to perform functional ambulation and decline in ADL participation and housekeeping abilities due to mobility impairments.  Pt also demonstrating low back pain which may be partly of mechanical origin due to core weakness and poor postural awareness/sustained positions.  PT services to address deifcits and limitations to participate in housekeeping and mobility with reduce risk for falls and improved comfort   OBJECTIVE IMPAIRMENTS Abnormal gait, decreased activity tolerance, decreased balance, decreased coordination, decreased endurance, decreased knowledge of use of DME, decreased mobility, difficulty walking, decreased strength, impaired perceived functional ability, impaired flexibility, improper body mechanics, and postural dysfunction.   ACTIVITY LIMITATIONS carrying, lifting, standing, transfers, bed mobility, reach over head, and locomotion level  PARTICIPATION LIMITATIONS: meal prep, cleaning, laundry, community activity, and yard work  PERSONAL FACTORS Age, Time since onset of injury/illness/exacerbation, and 1-2 comorbidities: neurological condition, hx of CA  are also affecting patient's functional outcome.   REHAB POTENTIAL: Good  CLINICAL DECISION MAKING: Evolving/moderate complexity  EVALUATION COMPLEXITY: Moderate  PLAN: PT FREQUENCY: 1-2x/week  PT DURATION: 6 weeks  PLANNED INTERVENTIONS: Therapeutic exercises, Therapeutic activity, Neuromuscular re-education, Balance training, Gait training, Patient/Family education, Self Care, Joint mobilization, Joint manipulation, Stair training, Vestibular training,  Canalith repositioning, Orthotic/Fit training, DME instructions, Aquatic Therapy, Dry Needling, Electrical stimulation, Wheelchair mobility training, Spinal mobilization, Cryotherapy, Moist heat, Splintting, Taping, Ultrasound, Biofeedback, and Manual therapy  PLAN FOR NEXT SESSION: supported prone, initiate HEP for hip flexion strength/trunk flexion, lumbar paraspinals.  Supported standing (home-concocted standing frame?)   [employer], PT 01/26/2022, 12:54 PM

## 2022-02-02 ENCOUNTER — Ambulatory Visit: Payer: Medicare Other | Attending: Neurology

## 2022-02-02 ENCOUNTER — Telehealth: Payer: Self-pay | Admitting: Neurology

## 2022-02-02 DIAGNOSIS — R2689 Other abnormalities of gait and mobility: Secondary | ICD-10-CM | POA: Diagnosis present

## 2022-02-02 DIAGNOSIS — R29898 Other symptoms and signs involving the musculoskeletal system: Secondary | ICD-10-CM | POA: Diagnosis present

## 2022-02-02 DIAGNOSIS — M6281 Muscle weakness (generalized): Secondary | ICD-10-CM | POA: Diagnosis present

## 2022-02-02 DIAGNOSIS — R2681 Unsteadiness on feet: Secondary | ICD-10-CM | POA: Insufficient documentation

## 2022-02-02 NOTE — Telephone Encounter (Signed)
Debbie w/ [facility] called and said they have not been successful in reaching the patient. She'd like a call back from [facility].

## 2022-02-02 NOTE — Therapy (Signed)
OUTPATIENT PHYSICAL THERAPY NEURO TREATMENT   Patient Name: Lawrence Owen MRN: [id number] DOB:02-Apr-1956, 66 y.o., male Today's Date: 02/02/2022   PCP: Marda Stalker REFERRING PROVIDER: Alda Berthold, DO    PT End of Session - 02/02/22 1310     Visit Number 2    Number of Visits 12    Date for PT Re-Evaluation 03/09/22    Authorization Type Medicare/Medicaid    PT Start Time 1315    PT Stop Time 1400    PT Time Calculation (min) 45 min    Activity Tolerance Patient tolerated treatment well    Behavior During Therapy WFL for tasks assessed/performed             Past Medical History:  Diagnosis Date   Anxiety    Cancer of tongue ([place])    neck- lymp  nodes right; treated radiation   COPD (chronic obstructive pulmonary disease) ([place])    Hypertension    Hypothyroidism    OCD (obsessive compulsive disorder)    PTSD (post-traumatic stress disorder)    Stroke ([place])    "mini stoke"    Tingling of right upper extremity    Past Surgical History:  Procedure Laterality Date   NECK SURGERY  2003   lymph node removed   TONSILLECTOMY     TRANSCAROTID ARTERY REVASCULARIZATION  Right 02/21/2019   Procedure: TRANSCAROTID ARTERY REVASCULARIZATION;  Surgeon: Marty Heck, MD;  Location: [place];  Service: Vascular;  Laterality: Right;   Patient Active Problem List   Diagnosis Date Noted   Carotid artery stenosis 02/21/2019   Carotid stenosis 01/15/2019   Stroke (cerebrum) ([place]) 03/04/2018   Alcohol use 03/04/2018   Essential hypertension 03/04/2018   Bilateral carotid artery disease ([place]) 03/04/2018   Essential hypertension, benign 03/12/2010   ALLERGIC RHINITIS 03/12/2010   HEADACHE 03/12/2010   CONTUSION, HEAD 01/27/2009   HYPERCHOLESTEROLEMIA 10/23/2008   LOW BACK PAIN SYNDROME 10/23/2008   GROIN PAIN 10/23/2008   CAROTID BRUIT, LEFT 09/18/2008   PERIODONTAL DISEASE 01/03/2008   DENTAL CARIES 03/13/2007   Hypothyroidism 02/16/2007   DISORDER,  PERSISTING AMNESTIC, DRUG-INDUCED 02/16/2007   DEPRESSION, MAJOR 02/16/2007   Pulmonary emphysema ([place]) 02/16/2007   CARCINOMA, SQUAMOUS CELL 05/30/2001    ONSET DATE: since 2021  REFERRING DIAG: R29.898 (ICD-10-CM) - Weakness of both lower extremities G95.9 (ICD-10-CM) - Myelopathy ([place])   THERAPY DIAG:  Muscle weakness (generalized)  Other abnormalities of gait and mobility  Unsteadiness on feet  Other symptoms and signs involving the musculoskeletal system  Rationale for Evaluation and Treatment Rehabilitation  SUBJECTIVE:  SUBJECTIVE STATEMENT: Been trying to perform prone position at home  Pt accompanied by: significant other  PERTINENT HISTORY: History of present illness: Starting ~2021, he began having difficulty walking, stating that he could not get his legs to move.  He feels that there is a disconnect between his brain and legs.  He tried using a walker but was falling frequently and has been predominately wheelchair-bound for the past few months.   For the past 6 weeks, he started having fatigue in the arms.  No muscle spasms or twitches  Myelopathy - Ddx: radiation-induced myelopathy vs primary lateral sclerosis. Evaluated at [facility] who favored radiation-induced myelopathy given that he also has sensory complaints in the upper extremities, in addition to prominent UMN findings    PAIN:  Are you having pain? No, feeling of chronic low back pain, worse when standing and extending, e.g. reaching overhead  PRECAUTIONS: Fall  WEIGHT BEARING RESTRICTIONS No  FALLS: Has patient fallen in last 6 months? No  LIVING ENVIRONMENT: Lives with: lives with their spouse Lives in: House/apartment Stairs: Yes: Internal: 12 steps; ramp to enter/exit home Has following  equipment at home: Wheelchair (power), Wheelchair (manual), and shower chair  PLOF: Needs assistance with ADLs, Needs assistance with homemaking, and Needs assistance with transfers  PATIENT GOALS be able to stand long/safely enough to perform activities like washing dishes and reaching coffee cups  OBJECTIVE:  TODAY'S TREATMENT: 02/02/22 Activity Comments  Supported prone -x 2 min to assess -hamstring curls 2# LLE, concentric assist RLE 1x12  quadruped -child's pose 5x -cat/cow 5x -alt. Shoulder taps 5x -lateral weight shift 5x  LAQ 2x10 2# LLE  Seated EOM, heavy row for pulling strength 1x10 10#, 1x10 15#  Standing at counter -knee bolster pad placed between knees and cabinets to prevent buckling -standing and performing unilat reach 1x15 reps lateral and overhead (single UE support) -unsupported standing x 77 sec w/ CGA for guarding       HOME EXERCISE PROGRAM: Access Code: [email or web address] Exercises - Cat Cow to Child's Pose  - 1 x daily - 7 x weekly - 10-20 reps - Prone Knee Flexion  - 1 x daily - 7 x weekly - 1-3 sets - 10 reps   DIAGNOSTIC FINDINGS:  IMPRESSION: Asymmetric activity within normal shaped striatum is favored within NORMAL LIMITS. No focal loss of dopamine transport activity to suggest Parkinson's syndrome pathology.  IMPRESSION: 1. Normal thoracic cord. 2. No acute abnormality or significant degenerative changes of the thoracic spine.  IMPRESSION: No acute or reversible finding. 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 Old small vessel infarctions of the thalami, basal ganglia and hemispheric white matter. Tiny old left occipital infarction.  COGNITION: Overall cognitive status: Within functional limits for tasks assessed   SENSATION: WFL, grossly  COORDINATION: Alt toe taps impaired, heel to shin limited by weakness and coordination defiicts  EDEMA:  none  MUSCLE TONE: unremarkable, very emaciated appearance, very little  subcutaneous fat      MUSCLE LENGTH: WFL, tight to straight leg raise position with strong passive resistance at 35-50 degrees  DTRs:  Bilateral clonus, 2-4 beat  POSTURE: slumped, sacral sitting--not fixed, able to correct with cues, slight decrease in lumbar lordosis appreciated  LOWER EXTREMITY ROM:    WFL  (Blank rows = not tested)  LOWER EXTREMITY MMT:    MMT Right Eval Left Eval  Hip flexion 3 3  Hip extension    Hip abduction 3- 3-  Hip adduction  3 3  Hip internal rotation    Hip external rotation    Knee flexion 2+ 3  Knee extension 3+ 3+  Ankle dorsiflexion 2- 2  Ankle plantarflexion 2+ 2+  Ankle inversion    Ankle eversion    (Blank rows = not tested)  Trunk flexion: 2+/5 UE: 5/5 resisted tests.  Able to perform bodyweight dip with RW (no LE contribution)  BED MOBILITY:  Modified indep for supine<>sit Need to check rolling on larger mat table  TRANSFERS: Performs squat-pivot w/c to EOM with modified indep. Stand-pivot with RW and SBA-CGA    CURB:  DNT due to safety  STAIRS:  Pt has been non-ambulatory since 2022  GAIT: Pt has been non-ambulatory since Aug 2022  FUNCTIONAL TESTs:  Static standing: dependence on BUE   PATIENT EDUCATION: Education details: postural awareness and sitting with lumbar support and upright posture. Also in monitoring orthostatic hypotension Person educated: Patient and Spouse Education method: Explanation, Demonstration, and Tactile cues Education comprehension: verbalized understanding and needs further education      GOALS: Goals reviewed with patient? Yes  SHORT TERM GOALS: Target date: 02/16/2022  Patient will be independent in HEP to improve functional outcomes Baseline: Goal status: INITIAL  2.  Patient will be able to tolerate (supported) prone position x 5 min to improve lumbar extension and progress for posterior chain strengthening Baseline: unable per report, need to trial rolling on mat  table to see Goal status: INITIAL  3.  Maintain supported standing x 5 min to improve participation in housekeeping and ADL participation Baseline: 1-3 min w/ BUE support on RW Goal status: INITIAL    LONG TERM GOALS: Target date: 03/09/2022  Improve trunk flexion strength to 3/5, hip flexion strength 3+/5, and lumbar extension strength 3/5 to facilitate lumbar stability and reduce pain Baseline:  Goal status: INITIAL  2.  Demonstrate ability to stand x 5 min and perform unilateral reaching/overhead 1x15 reps left/right in order to improve participation and safety with washing dishes Baseline:  Goal status: INITIAL   ASSESSMENT:  CLINICAL IMPRESSION: Able to tolerate supported prone position w/ 1-2 pillows under pelvis for some flexion bias.  Good activity tolerance for all initial activities with good ability to assume and maintain quadruped position.  Demonstrates very weak posterior chain and hamstring strength of 3/5 LLE and 2+/5 RLE.  Able to safely perform standing at counter with thick bolster to pad knees to prevent/stop buckling and pt reports he has a folding mattress at home that could be used to the same effect. Continued sessions to progress POC details   OBJECTIVE IMPAIRMENTS Abnormal gait, decreased activity tolerance, decreased balance, decreased coordination, decreased endurance, decreased knowledge of use of DME, decreased mobility, difficulty walking, decreased strength, impaired perceived functional ability, impaired flexibility, improper body mechanics, and postural dysfunction.   ACTIVITY LIMITATIONS carrying, lifting, standing, transfers, bed mobility, reach over head, and locomotion level  PARTICIPATION LIMITATIONS: meal prep, cleaning, laundry, community activity, and yard work  PERSONAL FACTORS Age, Time since onset of injury/illness/exacerbation, and 1-2 comorbidities: neurological condition, hx of CA  are also affecting patient's functional outcome.    REHAB POTENTIAL: Good  CLINICAL DECISION MAKING: Evolving/moderate complexity  EVALUATION COMPLEXITY: Moderate  PLAN: PT FREQUENCY: 1-2x/week  PT DURATION: 6 weeks  PLANNED INTERVENTIONS: Therapeutic exercises, Therapeutic activity, Neuromuscular re-education, Balance training, Gait training, Patient/Family education, Self Care, Joint mobilization, Joint manipulation, Stair training, Vestibular training, Canalith repositioning, Orthotic/Fit training, DME instructions, Aquatic Therapy, Dry Needling, Electrical stimulation, Wheelchair mobility training,  Spinal mobilization, Cryotherapy, Moist heat, Splintting, Taping, Ultrasound, Biofeedback, and Manual therapy  PLAN FOR NEXT SESSION: initiate HEP for hip flexion strength/trunk flexion, lumbar paraspinals.    Toniann Fail, PT 02/02/2022, 1:11 PM

## 2022-02-03 NOTE — Telephone Encounter (Signed)
Noted.  Prior notes indicate that they have been difficult to reach in the past.

## 2022-02-04 ENCOUNTER — Other Ambulatory Visit: Payer: Self-pay | Admitting: Family Medicine

## 2022-02-04 DIAGNOSIS — Z720 Tobacco use: Secondary | ICD-10-CM

## 2022-02-07 ENCOUNTER — Ambulatory Visit: Payer: Medicare Other

## 2022-02-07 DIAGNOSIS — R2681 Unsteadiness on feet: Secondary | ICD-10-CM

## 2022-02-07 DIAGNOSIS — M6281 Muscle weakness (generalized): Secondary | ICD-10-CM | POA: Diagnosis not present

## 2022-02-07 DIAGNOSIS — R29898 Other symptoms and signs involving the musculoskeletal system: Secondary | ICD-10-CM

## 2022-02-07 DIAGNOSIS — R2689 Other abnormalities of gait and mobility: Secondary | ICD-10-CM

## 2022-02-07 NOTE — Therapy (Signed)
OUTPATIENT PHYSICAL THERAPY NEURO TREATMENT   Patient Name: Lawrence Owen MRN: [id number] DOB:07-May-1956, 66 y.o., male Today's Date: 02/07/2022   PCP: Marda Stalker REFERRING PROVIDER: Alda Berthold, DO    PT End of Session - 02/07/22 1201     Visit Number 3    Number of Visits 12    Date for PT Re-Evaluation 03/09/22    Authorization Type Medicare/Medicaid    PT Start Time 1149    PT Stop Time 1230    PT Time Calculation (min) 41 min    Activity Tolerance Patient tolerated treatment well    Behavior During Therapy WFL for tasks assessed/performed             Past Medical History:  Diagnosis Date   Anxiety    Cancer of tongue ([place])    neck- lymp  nodes right; treated radiation   COPD (chronic obstructive pulmonary disease) ([place])    Hypertension    Hypothyroidism    OCD (obsessive compulsive disorder)    PTSD (post-traumatic stress disorder)    Stroke ([place])    "mini stoke"    Tingling of right upper extremity    Past Surgical History:  Procedure Laterality Date   NECK SURGERY  2003   lymph node removed   TONSILLECTOMY     TRANSCAROTID ARTERY REVASCULARIZATION  Right 02/21/2019   Procedure: TRANSCAROTID ARTERY REVASCULARIZATION;  Surgeon: Marty Heck, MD;  Location: [facility];  Service: Vascular;  Laterality: Right;   Patient Active Problem List   Diagnosis Date Noted   Carotid artery stenosis 02/21/2019   Carotid stenosis 01/15/2019   Stroke (cerebrum) ([place]) 03/04/2018   Alcohol use 03/04/2018   Essential hypertension 03/04/2018   Bilateral carotid artery disease ([place]) 03/04/2018   Essential hypertension, benign 03/12/2010   ALLERGIC RHINITIS 03/12/2010   HEADACHE 03/12/2010   CONTUSION, HEAD 01/27/2009   HYPERCHOLESTEROLEMIA 10/23/2008   LOW BACK PAIN SYNDROME 10/23/2008   GROIN PAIN 10/23/2008   CAROTID BRUIT, LEFT 09/18/2008   PERIODONTAL DISEASE 01/03/2008   DENTAL CARIES 03/13/2007   Hypothyroidism 02/16/2007   DISORDER,  PERSISTING AMNESTIC, DRUG-INDUCED 02/16/2007   DEPRESSION, MAJOR 02/16/2007   Pulmonary emphysema ([place]) 02/16/2007   CARCINOMA, SQUAMOUS CELL 05/30/2001    ONSET DATE: since 2021  REFERRING DIAG: R29.898 (ICD-10-CM) - Weakness of both lower extremities G95.9 (ICD-10-CM) - Myelopathy (H[facility])   THERAPY DIAG:  Muscle weakness (generalized)  Other abnormalities of gait and mobility  Unsteadiness on feet  Other symptoms and signs involving the musculoskeletal system  Rationale for Evaluation and Treatment Rehabilitation  SUBJECTIVE:  SUBJECTIVE STATEMENT: Getting a walker to use for standing at home  Pt accompanied by: significant other  PERTINENT HISTORY: History of present illness: Starting ~2021, he began having difficulty walking, stating that he could not get his legs to move.  He feels that there is a disconnect between his brain and legs.  He tried using a walker but was falling frequently and has been predominately wheelchair-bound for the past few months.   For the past 6 weeks, he started having fatigue in the arms.  No muscle spasms or twitches  Myelopathy - Ddx: radiation-induced myelopathy vs primary lateral sclerosis. Evaluated at [facility] who favored radiation-induced myelopathy given that he also has sensory complaints in the upper extremities, in addition to prominent UMN findings    PAIN:  Are you having pain? No, feeling of chronic low back pain, worse when standing and extending, e.g. reaching overhead  PRECAUTIONS: Fall  WEIGHT BEARING RESTRICTIONS No  FALLS: Has patient fallen in last 6 months? No  LIVING ENVIRONMENT: Lives with: lives with their spouse Lives in: House/apartment Stairs: Yes: Internal: 12 steps; ramp to enter/exit home Has following  equipment at home: Wheelchair (power), Wheelchair (manual), and shower chair  PLOF: Needs assistance with ADLs, Needs assistance with homemaking, and Needs assistance with transfers  PATIENT GOALS be able to stand long/safely enough to perform activities like washing dishes and reaching coffee cups  OBJECTIVE:   TODAY'S TREATMENT: 02/07/22 Activity Comments  NU-step LE only x 6 min resistance level 7 Use of UE to support LE to maintain hip abduction  Seated row 1x10, 10# 1x10, 15# 1x10, 20#  Standing at counter w/ bolster blocking knees 2# dumbell: unilat overhead press, alt press, bilat press 2x12 reps ea condition, using cabinet door faces to provide rest of dumbell             TODAY'S TREATMENT: 02/02/22 Activity Comments  Supported prone -x 2 min to assess -hamstring curls 2# LLE, concentric assist RLE 1x12  quadruped -child's pose 5x -cat/cow 5x -alt. Shoulder taps 5x -lateral weight shift 5x  LAQ 2x10 2# LLE  Seated EOM, heavy row for pulling strength 1x10 10#, 1x10 15#  Standing at counter -knee bolster pad placed between knees and cabinets to prevent buckling -standing and performing unilat reach 1x15 reps lateral and overhead (single UE support) -unsupported standing x 77 sec w/ CGA for guarding       HOME EXERCISE PROGRAM: Access Code: [email or web address] Exercises - Cat Cow to Child's Pose  - 1 x daily - 7 x weekly - 10-20 reps - Prone Knee Flexion  - 1 x daily - 7 x weekly - 1-3 sets - 10 reps   DIAGNOSTIC FINDINGS:  IMPRESSION: Asymmetric activity within normal shaped striatum is favored within NORMAL LIMITS. No focal loss of dopamine transport activity to suggest Parkinson's syndrome pathology.  IMPRESSION: 1. Normal thoracic cord. 2. No acute abnormality or significant degenerative changes of the thoracic spine.  IMPRESSION: No acute or reversible finding. 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 Old small vessel infarctions of the thalami, basal  ganglia and hemispheric white matter. Tiny old left occipital infarction.  COGNITION: Overall cognitive status: Within functional limits for tasks assessed   SENSATION: WFL, grossly  COORDINATION: Alt toe taps impaired, heel to shin limited by weakness and coordination defiicts  EDEMA:  none  MUSCLE TONE: unremarkable, very emaciated appearance, very little subcutaneous fat      MUSCLE LENGTH: WFL, tight to straight  leg raise position with strong passive resistance at 35-50 degrees  DTRs:  Bilateral clonus, 2-4 beat  POSTURE: slumped, sacral sitting--not fixed, able to correct with cues, slight decrease in lumbar lordosis appreciated  LOWER EXTREMITY ROM:    WFL  (Blank rows = not tested)  LOWER EXTREMITY MMT:    MMT Right Eval Left Eval  Hip flexion 3 3  Hip extension    Hip abduction 3- 3-  Hip adduction 3 3  Hip internal rotation    Hip external rotation    Knee flexion 2+ 3  Knee extension 3+ 3+  Ankle dorsiflexion 2- 2  Ankle plantarflexion 2+ 2+  Ankle inversion    Ankle eversion    (Blank rows = not tested)  Trunk flexion: 2+/5 UE: 5/5 resisted tests.  Able to perform bodyweight dip with RW (no LE contribution)  BED MOBILITY:  Modified indep for supine<>sit Need to check rolling on larger mat table  TRANSFERS: Performs squat-pivot w/c to EOM with modified indep. Stand-pivot with RW and SBA-CGA    CURB:  DNT due to safety  STAIRS:  Pt has been non-ambulatory since 2022  GAIT: Pt has been non-ambulatory since Aug 2022  FUNCTIONAL TESTs:  Static standing: dependence on BUE   PATIENT EDUCATION: Education details: postural awareness and sitting with lumbar support and upright posture. Also in monitoring orthostatic hypotension Person educated: Patient and Spouse Education method: Explanation, Demonstration, and Tactile cues Education comprehension: verbalized understanding and needs further education      GOALS: Goals reviewed  with patient? Yes  SHORT TERM GOALS: Target date: 02/16/2022  Patient will be independent in HEP to improve functional outcomes Baseline: Goal status: INITIAL  2.  Patient will be able to tolerate (supported) prone position x 5 min to improve lumbar extension and progress for posterior chain strengthening Baseline: unable per report, need to trial rolling on mat table to see Goal status: INITIAL  3.  Maintain supported standing x 5 min to improve participation in housekeeping and ADL participation Baseline: 1-3 min w/ BUE support on RW Goal status: INITIAL    LONG TERM GOALS: Target date: 03/09/2022  Improve trunk flexion strength to 3/5, hip flexion strength 3+/5, and lumbar extension strength 3/5 to facilitate lumbar stability and reduce pain Baseline:  Goal status: INITIAL  2.  Demonstrate ability to stand x 5 min and perform unilateral reaching/overhead 1x15 reps left/right in order to improve participation and safety with washing dishes Baseline:  Goal status: INITIAL   ASSESSMENT:  CLINICAL IMPRESSION: Tx emphasis on LE recruitment to improve standing tolerance and safety with faciltation of Wbing positions via NU-step and then supported standing whilst performing dynamic, resisted upper body movements to improve ADL performance and participation with activities such as washing and putting away dishes. Continued sessions to progress motor control and strength to improve stability in standing   OBJECTIVE IMPAIRMENTS Abnormal gait, decreased activity tolerance, decreased balance, decreased coordination, decreased endurance, decreased knowledge of use of DME, decreased mobility, difficulty walking, decreased strength, impaired perceived functional ability, impaired flexibility, improper body mechanics, and postural dysfunction.   ACTIVITY LIMITATIONS carrying, lifting, standing, transfers, bed mobility, reach over head, and locomotion level  PARTICIPATION LIMITATIONS: meal  prep, cleaning, laundry, community activity, and yard work  PERSONAL FACTORS Age, Time since onset of injury/illness/exacerbation, and 1-2 comorbidities: neurological condition, hx of CA  are also affecting patient's functional outcome.   REHAB POTENTIAL: Good  CLINICAL DECISION MAKING: Evolving/moderate complexity  EVALUATION COMPLEXITY: Moderate  PLAN: PT  FREQUENCY: 1-2x/week  PT DURATION: 6 weeks  PLANNED INTERVENTIONS: Therapeutic exercises, Therapeutic activity, Neuromuscular re-education, Balance training, Gait training, Patient/Family education, Self Care, Joint mobilization, Joint manipulation, Stair training, Vestibular training, Canalith repositioning, Orthotic/Fit training, DME instructions, Aquatic Therapy, Dry Needling, Electrical stimulation, Wheelchair mobility training, Spinal mobilization, Cryotherapy, Moist heat, Splintting, Taping, Ultrasound, Biofeedback, and Manual therapy  PLAN FOR NEXT SESSION: initiate HEP for hip flexion strength/trunk flexion, lumbar paraspinals.    Toniann Fail, PT 02/07/2022, 12:02 PM

## 2022-02-09 ENCOUNTER — Ambulatory Visit: Payer: Medicare Other

## 2022-02-09 DIAGNOSIS — R29898 Other symptoms and signs involving the musculoskeletal system: Secondary | ICD-10-CM

## 2022-02-09 DIAGNOSIS — R2689 Other abnormalities of gait and mobility: Secondary | ICD-10-CM

## 2022-02-09 DIAGNOSIS — M6281 Muscle weakness (generalized): Secondary | ICD-10-CM | POA: Diagnosis not present

## 2022-02-09 DIAGNOSIS — R2681 Unsteadiness on feet: Secondary | ICD-10-CM

## 2022-02-09 NOTE — Therapy (Signed)
OUTPATIENT PHYSICAL THERAPY NEURO TREATMENT   Patient Name: Lawrence Owen MRN: [id number] DOB:06-05-1955, 66 y.o., male Today's Date: 02/09/2022   PCP: Marda Stalker REFERRING PROVIDER: Alda Berthold, DO    PT End of Session - 02/09/22 1147     Visit Number 4    Number of Visits 12    Date for PT Re-Evaluation 03/09/22    Authorization Type Medicare/Medicaid    PT Start Time 1147    PT Stop Time 1443    PT Time Calculation (min) 43 min    Activity Tolerance Patient tolerated treatment well    Behavior During Therapy WFL for tasks assessed/performed             Past Medical History:  Diagnosis Date   Anxiety    Cancer of tongue ([place])    neck- lymp  nodes right; treated radiation   COPD (chronic obstructive pulmonary disease) ([place])    Hypertension    Hypothyroidism    OCD (obsessive compulsive disorder)    PTSD (post-traumatic stress disorder)    Stroke ([place])    "mini stoke"    Tingling of right upper extremity    Past Surgical History:  Procedure Laterality Date   NECK SURGERY  2003   lymph node removed   TONSILLECTOMY     TRANSCAROTID ARTERY REVASCULARIZATION  Right 02/21/2019   Procedure: TRANSCAROTID ARTERY REVASCULARIZATION;  Surgeon: Marty Heck, MD;  Location: [facility];  Service: Vascular;  Laterality: Right;   Patient Active Problem List   Diagnosis Date Noted   Carotid artery stenosis 02/21/2019   Carotid stenosis 01/15/2019   Stroke (cerebrum) ([place]) 03/04/2018   Alcohol use 03/04/2018   Essential hypertension 03/04/2018   Bilateral carotid artery disease ([place]) 03/04/2018   Essential hypertension, benign 03/12/2010   ALLERGIC RHINITIS 03/12/2010   HEADACHE 03/12/2010   CONTUSION, HEAD 01/27/2009   HYPERCHOLESTEROLEMIA 10/23/2008   LOW BACK PAIN SYNDROME 10/23/2008   GROIN PAIN 10/23/2008   CAROTID BRUIT, LEFT 09/18/2008   PERIODONTAL DISEASE 01/03/2008   DENTAL CARIES 03/13/2007   Hypothyroidism 02/16/2007   DISORDER,  PERSISTING AMNESTIC, DRUG-INDUCED 02/16/2007   DEPRESSION, MAJOR 02/16/2007   Pulmonary emphysema ([place]) 02/16/2007   CARCINOMA, SQUAMOUS CELL 05/30/2001    ONSET DATE: since 2021  REFERRING DIAG: R29.898 (ICD-10-CM) - Weakness of both lower extremities G95.9 (ICD-10-CM) - Myelopathy ([place])   THERAPY DIAG:  Muscle weakness (generalized)  Other abnormalities of gait and mobility  Unsteadiness on feet  Other symptoms and signs involving the musculoskeletal system  Rationale for Evaluation and Treatment Rehabilitation  SUBJECTIVE:  SUBJECTIVE STATEMENT: Went over medical history and imaging with a surgical PA an dit appears the spine/cord itself is fine  Pt accompanied by: significant other  PERTINENT HISTORY: History of present illness: Starting ~2021, he began having difficulty walking, stating that he could not get his legs to move.  He feels that there is a disconnect between his brain and legs.  He tried using a walker but was falling frequently and has been predominately wheelchair-bound for the past few months.   For the past 6 weeks, he started having fatigue in the arms.  No muscle spasms or twitches  Myelopathy - Ddx: radiation-induced myelopathy vs primary lateral sclerosis. Evaluated at [facility] who favored radiation-induced myelopathy given that he also has sensory complaints in the upper extremities, in addition to prominent UMN findings    PAIN:  Are you having pain? No, feeling of chronic low back pain, worse when standing and extending, e.g. reaching overhead  PRECAUTIONS: Fall  WEIGHT BEARING RESTRICTIONS No  FALLS: Has patient fallen in last 6 months? No  LIVING ENVIRONMENT: Lives with: lives with their spouse Lives in: House/apartment Stairs: Yes: Internal:  12 steps; ramp to enter/exit home Has following equipment at home: Wheelchair (power), Wheelchair (manual), and shower chair  PLOF: Needs assistance with ADLs, Needs assistance with homemaking, and Needs assistance with transfers  PATIENT GOALS be able to stand long/safely enough to perform activities like washing dishes and reaching coffee cups  OBJECTIVE:  TODAY'S TREATMENT: 02/09/22 Activity Comments  Seated single arm row 1x10, 10# 1x10, 20# 1x10, 25#  Seated good mornings  1x10 20# 1x10 25#  Prone position (1 pillow) -hamstring curls 1x8 negatives, 1x3 AROM, but fatigue requiring  continued assist for concentric phase -prone quad stretch 3x 60 sec -multifidi lift-off              TODAY'S TREATMENT: 02/07/22 Activity Comments  NU-step LE only x 6 min resistance level 7 Use of UE to support LE to maintain hip abduction  Seated row 1x10, 10# 1x10, 15# 1x10, 20#  Standing at counter w/ bolster blocking knees 2# dumbell: unilat overhead press, alt press, bilat press 2x12 reps ea condition, using cabinet door faces to provide rest of dumbell              HOME EXERCISE PROGRAM: Access Code: [email or web address] Exercises - Cat Cow to Child's Pose  - 1 x daily - 7 x weekly - 10-20 reps - Prone Knee Flexion  - 1 x daily - 7 x weekly - 1-3 sets - 10 reps   DIAGNOSTIC FINDINGS:  IMPRESSION: Asymmetric activity within normal shaped striatum is favored within [place]. No focal loss of dopamine transport activity to suggest Parkinson's syndrome pathology.  IMPRESSION: 1. Normal thoracic cord. 2. No acute abnormality or significant degenerative changes of the thoracic spine.  IMPRESSION: No acute or reversible finding. 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 Old small vessel infarctions of the thalami, basal ganglia and hemispheric white matter. Tiny old left occipital infarction.  COGNITION: Overall cognitive status: Within functional limits for tasks  assessed   SENSATION: WFL, grossly  COORDINATION: Alt toe taps impaired, heel to shin limited by weakness and coordination defiicts  EDEMA:  none  MUSCLE TONE: unremarkable, very emaciated appearance, very little subcutaneous fat      MUSCLE LENGTH: WFL, tight to straight leg raise position with strong passive resistance at 35-50 degrees  DTRs:  Bilateral clonus, 2-4 beat  POSTURE: slumped, sacral  sitting--not fixed, able to correct with cues, slight decrease in lumbar lordosis appreciated  LOWER EXTREMITY ROM:    WFL  (Blank rows = not tested)  LOWER EXTREMITY MMT:    MMT Right Eval Left Eval  Hip flexion 3 3  Hip extension    Hip abduction 3- 3-  Hip adduction 3 3  Hip internal rotation    Hip external rotation    Knee flexion 2+ 3  Knee extension 3+ 3+  Ankle dorsiflexion 2- 2  Ankle plantarflexion 2+ 2+  Ankle inversion    Ankle eversion    (Blank rows = not tested)  Trunk flexion: 2+/5 UE: 5/5 resisted tests.  Able to perform bodyweight dip with RW (no LE contribution)  BED MOBILITY:  Modified indep for supine<>sit Need to check rolling on larger mat table  TRANSFERS: Performs squat-pivot w/c to EOM with modified indep. Stand-pivot with RW and SBA-CGA    CURB:  DNT due to safety  STAIRS:  Pt has been non-ambulatory since 2022  GAIT: Pt has been non-ambulatory since Aug 2022  FUNCTIONAL TESTs:  Static standing: dependence on BUE   PATIENT EDUCATION: Education details: postural awareness and sitting with lumbar support and upright posture. Also in monitoring orthostatic hypotension Person educated: Patient and Spouse Education method: Explanation, Demonstration, and Tactile cues Education comprehension: verbalized understanding and needs further education      GOALS: Goals reviewed with patient? Yes  SHORT TERM GOALS: Target date: 02/16/2022  Patient will be independent in HEP to improve functional outcomes Baseline: Goal  status: INITIAL  2.  Patient will be able to tolerate (supported) prone position x 5 min to improve lumbar extension and progress for posterior chain strengthening Baseline: unable per report, need to trial rolling on mat table to see Goal status: INITIAL  3.  Maintain supported standing x 5 min to improve participation in housekeeping and ADL participation Baseline: 1-3 min w/ BUE support on RW Goal status: INITIAL    LONG TERM GOALS: Target date: 03/09/2022  Improve trunk flexion strength to 3/5, hip flexion strength 3+/5, and lumbar extension strength 3/5 to facilitate lumbar stability and reduce pain Baseline:  Goal status: INITIAL  2.  Demonstrate ability to stand x 5 min and perform unilateral reaching/overhead 1x15 reps left/right in order to improve participation and safety with washing dishes Baseline:  Goal status: INITIAL   ASSESSMENT:  CLINICAL IMPRESSION: Tx initiated with general strengthening for posterior chain/posture strength and then to supported prone for isolation work for hamstrings.  Able to initiate against gravity hamstring partially with RLE being weaker than left and possibly some right quad spasticity present. Spine musculature demo good control and bulk with appreciable muscle bulk/tone noted with multifid lift-off. Demonstrates considerable hip flexor tightness w/ prone quad stretch. Continued sessions to improve strength, ROM, flexibility, and standing capabilities to enhance mobility and quality of life   OBJECTIVE IMPAIRMENTS Abnormal gait, decreased activity tolerance, decreased balance, decreased coordination, decreased endurance, decreased knowledge of use of DME, decreased mobility, difficulty walking, decreased strength, impaired perceived functional ability, impaired flexibility, improper body mechanics, and postural dysfunction.   ACTIVITY LIMITATIONS carrying, lifting, standing, transfers, bed mobility, reach over head, and locomotion  level  PARTICIPATION LIMITATIONS: meal prep, cleaning, laundry, community activity, and yard work  PERSONAL FACTORS Age, Time since onset of injury/illness/exacerbation, and 1-2 comorbidities: neurological condition, hx of CA  are also affecting patient's functional outcome.   REHAB POTENTIAL: Good  CLINICAL DECISION MAKING: Evolving/moderate complexity  EVALUATION COMPLEXITY: Moderate  PLAN: PT FREQUENCY: 1-2x/week  PT DURATION: 6 weeks  PLANNED INTERVENTIONS: Therapeutic exercises, Therapeutic activity, Neuromuscular re-education, Balance training, Gait training, Patient/Family education, Self Care, Joint mobilization, Joint manipulation, Stair training, Vestibular training, Canalith repositioning, Orthotic/Fit training, DME instructions, Aquatic Therapy, Dry Needling, Electrical stimulation, Wheelchair mobility training, Spinal mobilization, Cryotherapy, Moist heat, Splintting, Taping, Ultrasound, Biofeedback, and Manual therapy  PLAN FOR NEXT SESSION: initiate HEP for hip flexion strength/trunk flexion, lumbar paraspinals.    Toniann Fail, PT 02/09/2022, 11:47 AM

## 2022-02-14 ENCOUNTER — Ambulatory Visit: Payer: Medicare Other

## 2022-02-16 ENCOUNTER — Ambulatory Visit: Payer: Medicare Other

## 2022-02-16 DIAGNOSIS — M6281 Muscle weakness (generalized): Secondary | ICD-10-CM | POA: Diagnosis not present

## 2022-02-16 DIAGNOSIS — R29898 Other symptoms and signs involving the musculoskeletal system: Secondary | ICD-10-CM

## 2022-02-16 DIAGNOSIS — R2681 Unsteadiness on feet: Secondary | ICD-10-CM

## 2022-02-16 DIAGNOSIS — R2689 Other abnormalities of gait and mobility: Secondary | ICD-10-CM

## 2022-02-16 NOTE — Therapy (Signed)
OUTPATIENT PHYSICAL THERAPY NEURO TREATMENT   Patient Name: Lawrence Owen MRN: [id number] DOB:1956/03/19, 66 y.o., male Today's Date: 02/16/2022   PCP: Marda Stalker REFERRING PROVIDER: Alda Berthold, DO    PT End of Session - 02/16/22 1157     Visit Number 5    Number of Visits 12    Date for PT Re-Evaluation 03/09/22    Authorization Type Medicare/Medicaid    PT Start Time 1155    PT Stop Time 6948    PT Time Calculation (min) 40 min    Activity Tolerance Patient tolerated treatment well    Behavior During Therapy WFL for tasks assessed/performed             Past Medical History:  Diagnosis Date   Anxiety    Cancer of tongue ([place])    neck- lymp  nodes right; treated radiation   COPD (chronic obstructive pulmonary disease) ([place])    Hypertension    Hypothyroidism    OCD (obsessive compulsive disorder)    PTSD (post-traumatic stress disorder)    Stroke ([place])    "mini stoke"    Tingling of right upper extremity    Past Surgical History:  Procedure Laterality Date   NECK SURGERY  2003   lymph node removed   TONSILLECTOMY     TRANSCAROTID ARTERY REVASCULARIZATION  Right 02/21/2019   Procedure: TRANSCAROTID ARTERY REVASCULARIZATION;  Surgeon: Marty Heck, MD;  Location: [facility];  Service: Vascular;  Laterality: Right;   Patient Active Problem List   Diagnosis Date Noted   Carotid artery stenosis 02/21/2019   Carotid stenosis 01/15/2019   Stroke (cerebrum) ([place]) 03/04/2018   Alcohol use 03/04/2018   Essential hypertension 03/04/2018   Bilateral carotid artery disease ([place]) 03/04/2018   Essential hypertension, benign 03/12/2010   ALLERGIC RHINITIS 03/12/2010   HEADACHE 03/12/2010   CONTUSION, HEAD 01/27/2009   HYPERCHOLESTEROLEMIA 10/23/2008   LOW BACK PAIN SYNDROME 10/23/2008   GROIN PAIN 10/23/2008   CAROTID BRUIT, LEFT 09/18/2008   PERIODONTAL DISEASE 01/03/2008   DENTAL CARIES 03/13/2007   Hypothyroidism 02/16/2007   DISORDER,  PERSISTING AMNESTIC, DRUG-INDUCED 02/16/2007   DEPRESSION, MAJOR 02/16/2007   Pulmonary emphysema ([place]) 02/16/2007   CARCINOMA, SQUAMOUS CELL 05/30/2001    ONSET DATE: since 2021  REFERRING DIAG: R29.898 (ICD-10-CM) - Weakness of both lower extremities G95.9 (ICD-10-CM) - Myelopathy (HCC)   THERAPY DIAG:  Muscle weakness (generalized)  Other abnormalities of gait and mobility  Unsteadiness on feet  Other symptoms and signs involving the musculoskeletal system  Rationale for Evaluation and Treatment Rehabilitation  SUBJECTIVE:  SUBJECTIVE STATEMENT: Legs feel weak today, been having GI issues  Pt accompanied by: significant other  PERTINENT HISTORY: History of present illness: Starting ~2021, he began having difficulty walking, stating that he could not get his legs to move.  He feels that there is a disconnect between his brain and legs.  He tried using a walker but was falling frequently and has been predominately wheelchair-bound for the past few months.   For the past 6 weeks, he started having fatigue in the arms.  No muscle spasms or twitches  Myelopathy - Ddx: radiation-induced myelopathy vs primary lateral sclerosis. Evaluated at [facility] who favored radiation-induced myelopathy given that he also has sensory complaints in the upper extremities, in addition to prominent UMN findings    PAIN:  Are you having pain? No, feeling of chronic low back pain, worse when standing and extending, e.g. reaching overhead  PRECAUTIONS: Fall  WEIGHT BEARING RESTRICTIONS No  FALLS: Has patient fallen in last 6 months? No  LIVING ENVIRONMENT: Lives with: lives with their spouse Lives in: House/apartment Stairs: Yes: Internal: 12 steps; ramp to enter/exit home Has following equipment  at home: Wheelchair (power), Wheelchair (manual), and shower chair  PLOF: Needs assistance with ADLs, Needs assistance with homemaking, and Needs assistance with transfers  PATIENT GOALS be able to stand long/safely enough to perform activities like washing dishes and reaching coffee cups  OBJECTIVE:  TODAY'S TREATMENT: 02/16/22 Activity Comments  NU-step Warm-up x 2 min level 5 Level 10 x 3 min Level 5 x 2 min.  Cues for knees apart posture  Standing at counter -bolster blocking knees. Performing dynamic reaching on cabinets single UE, support on counter -unilat overhead press 1x10 5# (dumbell against cabinet)  Seated cable row 1x10 reps 15#, 20, 25#  Seated good morning 1x10 20#, 15#               HOME EXERCISE PROGRAM: Access Code: [email or web address] Exercises - Cat Cow to Child's Pose  - 1 x daily - 7 x weekly - 10-20 reps - Prone Knee Flexion  - 1 x daily - 7 x weekly - 1-3 sets - 10 reps   DIAGNOSTIC FINDINGS:  IMPRESSION: Asymmetric activity within normal shaped striatum is favored within NORMAL LIMITS. No focal loss of dopamine transport activity to suggest Parkinson's syndrome pathology.  IMPRESSION: 1. Normal thoracic cord. 2. No acute abnormality or significant degenerative changes of the thoracic spine.  IMPRESSION: No acute or reversible finding. 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 Old small vessel infarctions of the thalami, basal ganglia and hemispheric white matter. Tiny old left occipital infarction.  COGNITION: Overall cognitive status: Within functional limits for tasks assessed   SENSATION: WFL, grossly  COORDINATION: Alt toe taps impaired, heel to shin limited by weakness and coordination defiicts  EDEMA:  none  MUSCLE TONE: unremarkable, very emaciated appearance, very little subcutaneous fat      MUSCLE LENGTH: WFL, tight to straight leg raise position with strong passive resistance at 35-50 degrees  DTRs:   Bilateral clonus, 2-4 beat  POSTURE: slumped, sacral sitting--not fixed, able to correct with cues, slight decrease in lumbar lordosis appreciated  LOWER EXTREMITY ROM:    WFL  (Blank rows = not tested)  LOWER EXTREMITY MMT:    MMT Right Eval Left Eval  Hip flexion 3 3  Hip extension    Hip abduction 3- 3-  Hip adduction 3 3  Hip internal rotation    Hip external rotation  Knee flexion 2+ 3  Knee extension 3+ 3+  Ankle dorsiflexion 2- 2  Ankle plantarflexion 2+ 2+  Ankle inversion    Ankle eversion    (Blank rows = not tested)  Trunk flexion: 2+/5 UE: 5/5 resisted tests.  Able to perform bodyweight dip with RW (no LE contribution)  BED MOBILITY:  Modified indep for supine<>sit Need to check rolling on larger mat table  TRANSFERS: Performs squat-pivot w/c to EOM with modified indep. Stand-pivot with RW and SBA-CGA    CURB:  DNT due to safety  STAIRS:  Pt has been non-ambulatory since 2022  GAIT: Pt has been non-ambulatory since Aug 2022  FUNCTIONAL TESTs:  Static standing: dependence on BUE   PATIENT EDUCATION: Education details: postural awareness and sitting with lumbar support and upright posture. Also in monitoring orthostatic hypotension Person educated: Patient and Spouse Education method: Explanation, Demonstration, and Tactile cues Education comprehension: verbalized understanding and needs further education      GOALS: Goals reviewed with patient? Yes  SHORT TERM GOALS: Target date: 02/16/2022  Patient will be independent in HEP to improve functional outcomes Baseline: Goal status: INITIAL  2.  Patient will be able to tolerate (supported) prone position x 5 min to improve lumbar extension and progress for posterior chain strengthening Baseline: unable per report, need to trial rolling on mat table to see Goal status: INITIAL  3.  Maintain supported standing x 5 min to improve participation in housekeeping and ADL  participation Baseline: 1-3 min w/ BUE support on RW Goal status: INITIAL    LONG TERM GOALS: Target date: 03/09/2022  Improve trunk flexion strength to 3/5, hip flexion strength 3+/5, and lumbar extension strength 3/5 to facilitate lumbar stability and reduce pain Baseline:  Goal status: INITIAL  2.  Demonstrate ability to stand x 5 min and perform unilateral reaching/overhead 1x15 reps left/right in order to improve participation and safety with washing dishes Baseline:  Goal status: INITIAL   ASSESSMENT:  CLINICAL IMPRESSION: Pt notes reduced activity tolerance due to recent illness and GI issues which has limited his time OOB over the past week.  Proceeded with activities to improve general strength and standing tolerance to improve safety and participation with housekeeping activities such as standing at kitchen sink and other ADL participation.  Tolerating standing and overhead activities with unilateral support up to 3 min in duration w/ UE AROM and then resistance exercise w/ seated rest periods between trials. Continued sessions to progress HEP and improve safety and tolerance with standing activities.    OBJECTIVE IMPAIRMENTS Abnormal gait, decreased activity tolerance, decreased balance, decreased coordination, decreased endurance, decreased knowledge of use of DME, decreased mobility, difficulty walking, decreased strength, impaired perceived functional ability, impaired flexibility, improper body mechanics, and postural dysfunction.   ACTIVITY LIMITATIONS carrying, lifting, standing, transfers, bed mobility, reach over head, and locomotion level  PARTICIPATION LIMITATIONS: meal prep, cleaning, laundry, community activity, and yard work  PERSONAL FACTORS Age, Time since onset of injury/illness/exacerbation, and 1-2 comorbidities: neurological condition, hx of CA  are also affecting patient's functional outcome.   REHAB POTENTIAL: Good  CLINICAL DECISION MAKING:  Evolving/moderate complexity  EVALUATION COMPLEXITY: Moderate  PLAN: PT FREQUENCY: 1-2x/week  PT DURATION: 6 weeks  PLANNED INTERVENTIONS: Therapeutic exercises, Therapeutic activity, Neuromuscular re-education, Balance training, Gait training, Patient/Family education, Self Care, Joint mobilization, Joint manipulation, Stair training, Vestibular training, Canalith repositioning, Orthotic/Fit training, DME instructions, Aquatic Therapy, Dry Needling, Electrical stimulation, Wheelchair mobility training, Spinal mobilization, Cryotherapy, Moist heat, Splintting, Taping, Ultrasound, Biofeedback, and  Manual therapy  PLAN FOR NEXT SESSION: initiate HEP for hip flexion strength/trunk flexion, lumbar paraspinals.    12:43 PM, 02/16/22 M. Sherlyn Lees, PT, DPT Physical Therapist- [place] Office Number: [phone]

## 2022-02-21 ENCOUNTER — Ambulatory Visit: Payer: Medicare Other

## 2022-02-21 DIAGNOSIS — R29898 Other symptoms and signs involving the musculoskeletal system: Secondary | ICD-10-CM

## 2022-02-21 DIAGNOSIS — M6281 Muscle weakness (generalized): Secondary | ICD-10-CM

## 2022-02-21 DIAGNOSIS — R2681 Unsteadiness on feet: Secondary | ICD-10-CM

## 2022-02-21 DIAGNOSIS — R2689 Other abnormalities of gait and mobility: Secondary | ICD-10-CM

## 2022-02-21 NOTE — Therapy (Signed)
OUTPATIENT PHYSICAL THERAPY NEURO TREATMENT   Patient Name: Lawrence Owen MRN: [id number] DOB:[birth year], 66 y.o., male Today's Date: 02/21/2022   PCP: Marda Stalker REFERRING PROVIDER: Alda Berthold, DO    PT End of Session - 02/21/22 1153     Visit Number 6    Number of Visits 12    Date for PT Re-Evaluation 03/09/22    Authorization Type Medicare/Medicaid    PT Start Time 1151    PT Stop Time 1230    PT Time Calculation (min) 39 min    Activity Tolerance Patient tolerated treatment well    Behavior During Therapy WFL for tasks assessed/performed             Past Medical History:  Diagnosis Date   Anxiety    Cancer of tongue ([place])    neck- lymp  nodes right; treated radiation   COPD (chronic obstructive pulmonary disease) ([place])    Hypertension    Hypothyroidism    OCD (obsessive compulsive disorder)    PTSD (post-traumatic stress disorder)    Stroke ([place])    "mini stoke"    Tingling of right upper extremity    Past Surgical History:  Procedure Laterality Date   NECK SURGERY  2003   lymph node removed   TONSILLECTOMY     TRANSCAROTID ARTERY REVASCULARIZATION  Right 02/21/2019   Procedure: TRANSCAROTID ARTERY REVASCULARIZATION;  Surgeon: Marty Heck, MD;  Location: [facility];  Service: Vascular;  Laterality: Right;   Patient Active Problem List   Diagnosis Date Noted   Carotid artery stenosis 02/21/2019   Carotid stenosis 01/15/2019   Stroke (cerebrum) ([place]) 03/04/2018   Alcohol use 03/04/2018   Essential hypertension 03/04/2018   Bilateral carotid artery disease ([place]) 03/04/2018   Essential hypertension, benign 03/12/2010   ALLERGIC RHINITIS 03/12/2010   HEADACHE 03/12/2010   CONTUSION, HEAD 01/27/2009   HYPERCHOLESTEROLEMIA 10/23/2008   LOW BACK PAIN SYNDROME 10/23/2008   GROIN PAIN 10/23/2008   CAROTID BRUIT, LEFT 09/18/2008   PERIODONTAL DISEASE 01/03/2008   DENTAL CARIES 03/13/2007   Hypothyroidism 02/16/2007   DISORDER,  PERSISTING AMNESTIC, DRUG-INDUCED 02/16/2007   DEPRESSION, MAJOR 02/16/2007   Pulmonary emphysema ([place]) 02/16/2007   CARCINOMA, SQUAMOUS CELL 05/30/2001    ONSET DATE: since 2021  REFERRING DIAG: R29.898 (ICD-10-CM) - Weakness of both lower extremities G95.9 (ICD-10-CM) - Myelopathy ([place])   THERAPY DIAG:  Muscle weakness (generalized)  Other abnormalities of gait and mobility  Unsteadiness on feet  Other symptoms and signs involving the musculoskeletal system  Rationale for Evaluation and Treatment Rehabilitation  SUBJECTIVE:  SUBJECTIVE STATEMENT: Been off for a while, increasing difficulty with mobility, bowel, and bladder function. Recurring UTI's and has been using condom catheter.   Pt accompanied by: significant other  PERTINENT HISTORY: History of present illness: Starting ~2021, he began having difficulty walking, stating that he could not get his legs to move.  He feels that there is a disconnect between his brain and legs.  He tried using a walker but was falling frequently and has been predominately wheelchair-bound for the past few months.   For the past 6 weeks, he started having fatigue in the arms.  No muscle spasms or twitches  Myelopathy - Ddx: radiation-induced myelopathy vs primary lateral sclerosis. Evaluated at [facility] who favored radiation-induced myelopathy given that he also has sensory complaints in the upper extremities, in addition to prominent UMN findings    PAIN:  Are you having pain? No, feeling of chronic low back pain, worse when standing and extending, e.g. reaching overhead  PRECAUTIONS: Fall  WEIGHT BEARING RESTRICTIONS No  FALLS: Has patient fallen in last 6 months? No  LIVING ENVIRONMENT: Lives with: lives with their spouse Lives in:  House/apartment Stairs: Yes: Internal: 12 steps; ramp to enter/exit home Has following equipment at home: Wheelchair (power), Wheelchair (manual), and shower chair  PLOF: Needs assistance with ADLs, Needs assistance with homemaking, and Needs assistance with transfers  PATIENT GOALS be able to stand long/safely enough to perform activities like washing dishes and reaching coffee cups  OBJECTIVE:  TODAY'S TREATMENT: 02/21/22 Activity Comments  Standing at counter w/ bolster at knees -Overhead wall wash 2x10 -lateral wall slide 1x10 -fine motor task at counter x 12 min -modified quadruped hip extension 3x10                    TODAY'S TREATMENT: 02/16/22 Activity Comments  NU-step Warm-up x 2 min level 5 Level 10 x 3 min Level 5 x 2 min.  Cues for knees apart posture  Standing at counter -bolster blocking knees. Performing dynamic reaching on cabinets single UE, support on counter -unilat overhead press 1x10 5# (dumbell against cabinet)  Seated cable row 1x10 reps 15#, 20, 25#  Seated good morning 1x10 20#, 15#               HOME EXERCISE PROGRAM: Access Code: [email or web address] Exercises - Cat Cow to Child's Pose  - 1 x daily - 7 x weekly - 10-20 reps - Prone Knee Flexion  - 1 x daily - 7 x weekly - 1-3 sets - 10 reps   DIAGNOSTIC FINDINGS:  IMPRESSION: Asymmetric activity within normal shaped striatum is favored within NORMAL LIMITS. No focal loss of dopamine transport activity to suggest Parkinson's syndrome pathology.  IMPRESSION: 1. Normal thoracic cord. 2. No acute abnormality or significant degenerative changes of the thoracic spine.  IMPRESSION: No acute or reversible finding. 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 Old small vessel infarctions of the thalami, basal ganglia and hemispheric white matter. Tiny old left occipital infarction.  COGNITION: Overall cognitive status: Within functional limits for tasks assessed   SENSATION: WFL,  grossly  COORDINATION: Alt toe taps impaired, heel to shin limited by weakness and coordination defiicts  EDEMA:  none  MUSCLE TONE: unremarkable, very emaciated appearance, very little subcutaneous fat      MUSCLE LENGTH: WFL, tight to straight leg raise position with strong passive resistance at 35-50 degrees  DTRs:  Bilateral clonus, 2-4 beat  POSTURE: slumped, sacral sitting--not fixed,  able to correct with cues, slight decrease in lumbar lordosis appreciated  LOWER EXTREMITY ROM:    WFL  (Blank rows = not tested)  LOWER EXTREMITY MMT:    MMT Right Eval Left Eval  Hip flexion 3 3  Hip extension    Hip abduction 3- 3-  Hip adduction 3 3  Hip internal rotation    Hip external rotation    Knee flexion 2+ 3  Knee extension 3+ 3+  Ankle dorsiflexion 2- 2  Ankle plantarflexion 2+ 2+  Ankle inversion    Ankle eversion    (Blank rows = not tested)  Trunk flexion: 2+/5 UE: 5/5 resisted tests.  Able to perform bodyweight dip with RW (no LE contribution)  BED MOBILITY:  Modified indep for supine<>sit Need to check rolling on larger mat table  TRANSFERS: Performs squat-pivot w/c to EOM with modified indep. Stand-pivot with RW and SBA-CGA    CURB:  DNT due to safety  STAIRS:  Pt has been non-ambulatory since 2022  GAIT: Pt has been non-ambulatory since Aug 2022  FUNCTIONAL TESTs:  Static standing: dependence on BUE   PATIENT EDUCATION: Education details: postural awareness and sitting with lumbar support and upright posture. Also in monitoring orthostatic hypotension Person educated: Patient and Spouse Education method: Explanation, Demonstration, and Tactile cues Education comprehension: verbalized understanding and needs further education      GOALS: Goals reviewed with patient? Yes  SHORT TERM GOALS: Target date: 02/16/2022  Patient will be independent in HEP to improve functional outcomes Baseline: Goal status: On-going  2.  Patient  will be able to tolerate (supported) prone position x 5 min to improve lumbar extension and progress for posterior chain strengthening Baseline: unable per report, need to trial rolling on mat table to see Goal status: on-going  3.  Maintain supported standing x 5 min to improve participation in housekeeping and ADL participation Baseline: 1-3 min w/ BUE support on RW Goal status: MET    LONG TERM GOALS: Target date: 03/09/2022  Improve trunk flexion strength to 3/5, hip flexion strength 3+/5, and lumbar extension strength 3/5 to facilitate lumbar stability and reduce pain Baseline:  Goal status: INITIAL  2.  Demonstrate ability to stand x 5 min and perform unilateral reaching/overhead 1x15 reps left/right in order to improve participation and safety with washing dishes Baseline:  Goal status: INITIAL   ASSESSMENT:  CLINICAL IMPRESSION: Tx emphasis on improving standing balance and tolerance for housekeeping and Adl tasks performed at counter top for UE support and padded bolster against knees and base cabinets to prevent knee buckling and performing varitey of UE dynamic tasks to reduce dependence on BUE support to maintain standing and progressing to LE movements for isolation/strength which was tolerated to good effect with min A needed for stabilizing when placed in single limb support positions. Able to meet initial STG for standing support/tolerance.  Continued sessions indicated to meet POC requirements and improve LE strength and progress activity tolerance to improve safety with standing.    OBJECTIVE IMPAIRMENTS Abnormal gait, decreased activity tolerance, decreased balance, decreased coordination, decreased endurance, decreased knowledge of use of DME, decreased mobility, difficulty walking, decreased strength, impaired perceived functional ability, impaired flexibility, improper body mechanics, and postural dysfunction.   ACTIVITY LIMITATIONS carrying, lifting, standing,  transfers, bed mobility, reach over head, and locomotion level  PARTICIPATION LIMITATIONS: meal prep, cleaning, laundry, community activity, and yard work  PERSONAL FACTORS Age, Time since onset of injury/illness/exacerbation, and 1-2 comorbidities: neurological condition, hx of CA  are also affecting patient's functional outcome.   REHAB POTENTIAL: Good  CLINICAL DECISION MAKING: Evolving/moderate complexity  EVALUATION COMPLEXITY: Moderate  PLAN: PT FREQUENCY: 1-2x/week  PT DURATION: 6 weeks  PLANNED INTERVENTIONS: Therapeutic exercises, Therapeutic activity, Neuromuscular re-education, Balance training, Gait training, Patient/Family education, Self Care, Joint mobilization, Joint manipulation, Stair training, Vestibular training, Canalith repositioning, Orthotic/Fit training, DME instructions, Aquatic Therapy, Dry Needling, Electrical stimulation, Wheelchair mobility training, Spinal mobilization, Cryotherapy, Moist heat, Splintting, Taping, Ultrasound, Biofeedback, and Manual therapy  PLAN FOR NEXT SESSION: initiate HEP for hip flexion strength/trunk flexion, lumbar paraspinals.    11:53 AM, 02/21/22 M. Sherlyn Lees, PT, DPT Physical Therapist- [place] Office Number: [phone]

## 2022-02-23 ENCOUNTER — Ambulatory Visit: Payer: Medicare Other

## 2022-02-23 DIAGNOSIS — M6281 Muscle weakness (generalized): Secondary | ICD-10-CM | POA: Diagnosis not present

## 2022-02-23 DIAGNOSIS — R2689 Other abnormalities of gait and mobility: Secondary | ICD-10-CM

## 2022-02-23 DIAGNOSIS — R2681 Unsteadiness on feet: Secondary | ICD-10-CM

## 2022-02-23 DIAGNOSIS — R29898 Other symptoms and signs involving the musculoskeletal system: Secondary | ICD-10-CM

## 2022-02-23 NOTE — Therapy (Signed)
OUTPATIENT PHYSICAL THERAPY NEURO TREATMENT   Patient Name: Lawrence Owen MRN: [id number] DOB:23-Jan-1956, 66 y.o., male Today's Date: 02/23/2022   PCP: Marda Stalker REFERRING PROVIDER: Alda Berthold, DO    PT End of Session - 02/23/22 1150     Visit Number 7    Number of Visits 12    Date for PT Re-Evaluation 03/09/22    Authorization Type Medicare/Medicaid    PT Start Time 1145    PT Stop Time 1230    PT Time Calculation (min) 45 min    Activity Tolerance Patient tolerated treatment well    Behavior During Therapy WFL for tasks assessed/performed             Past Medical History:  Diagnosis Date   Anxiety    Cancer of tongue ([place])    neck- lymp  nodes right; treated radiation   COPD (chronic obstructive pulmonary disease) ([place])    Hypertension    Hypothyroidism    OCD (obsessive compulsive disorder)    PTSD (post-traumatic stress disorder)    Stroke ([place])    "mini stoke"    Tingling of right upper extremity    Past Surgical History:  Procedure Laterality Date   NECK SURGERY  2003   lymph node removed   TONSILLECTOMY     TRANSCAROTID ARTERY REVASCULARIZATION  Right 02/21/2019   Procedure: TRANSCAROTID ARTERY REVASCULARIZATION;  Surgeon: Marty Heck, MD;  Location: [place];  Service: Vascular;  Laterality: Right;   Patient Active Problem List   Diagnosis Date Noted   Carotid artery stenosis 02/21/2019   Carotid stenosis 01/15/2019   Stroke (cerebrum) ([place]) 03/04/2018   Alcohol use 03/04/2018   Essential hypertension 03/04/2018   Bilateral carotid artery disease ([place]) 03/04/2018   Essential hypertension, benign 03/12/2010   ALLERGIC RHINITIS 03/12/2010   HEADACHE 03/12/2010   CONTUSION, HEAD 01/27/2009   HYPERCHOLESTEROLEMIA 10/23/2008   LOW BACK PAIN SYNDROME 10/23/2008   GROIN PAIN 10/23/2008   CAROTID BRUIT, LEFT 09/18/2008   PERIODONTAL DISEASE 01/03/2008   DENTAL CARIES 03/13/2007   Hypothyroidism 02/16/2007   DISORDER,  PERSISTING AMNESTIC, DRUG-INDUCED 02/16/2007   DEPRESSION, MAJOR 02/16/2007   Pulmonary emphysema ([place]) 02/16/2007   CARCINOMA, SQUAMOUS CELL 05/30/2001    ONSET DATE: since 2021  REFERRING DIAG: R29.898 (ICD-10-CM) - Weakness of both lower extremities G95.9 (ICD-10-CM) - Myelopathy (H[facility])   THERAPY DIAG:  Muscle weakness (generalized)  Other abnormalities of gait and mobility  Unsteadiness on feet  Other symptoms and signs involving the musculoskeletal system  Rationale for Evaluation and Treatment Rehabilitation  SUBJECTIVE:  SUBJECTIVE STATEMENT: Having a lot of Dr appointments  Pt accompanied by: significant other  PERTINENT HISTORY: History of present illness: Starting ~2021, he began having difficulty walking, stating that he could not get his legs to move.  He feels that there is a disconnect between his brain and legs.  He tried using a walker but was falling frequently and has been predominately wheelchair-bound for the past few months.   For the past 6 weeks, he started having fatigue in the arms.  No muscle spasms or twitches  Myelopathy - Ddx: radiation-induced myelopathy vs primary lateral sclerosis. Evaluated at [facility] who favored radiation-induced myelopathy given that he also has sensory complaints in the upper extremities, in addition to prominent UMN findings    PAIN:  Are you having pain? No, feeling of chronic low back pain, worse when standing and extending, e.g. reaching overhead  PRECAUTIONS: Fall  WEIGHT BEARING RESTRICTIONS No  FALLS: Has patient fallen in last 6 months? No  LIVING ENVIRONMENT: Lives with: lives with their spouse Lives in: House/apartment Stairs: Yes: Internal: 12 steps; ramp to enter/exit home Has following equipment at home:  Wheelchair (power), Wheelchair (manual), and shower chair  PLOF: Needs assistance with ADLs, Needs assistance with homemaking, and Needs assistance with transfers  PATIENT GOALS be able to stand long/safely enough to perform activities like washing dishes and reaching coffee cups  OBJECTIVE:  TODAY'S TREATMENT: 02/23/22 Activity Comments  Standing at counter w/ bolster at knees -static at counter x 5 min -static no UE support 3x15 sec -wall slides BUE overhead 30x -lateral wall slides 20x -modified quadruped 4x10 (therapist guarding stance leg to prevent buckling) -sidestepping w/ min A x4 lengths -sit to stand 5x5 w/ elevated chair height                   TODAY'S TREATMENT: 02/21/22 Activity Comments  Standing at counter w/ bolster at knees -Overhead wall wash 2x10 -lateral wall slide 1x10 -fine motor task at counter x 12 min -modified quadruped hip extension 3x10                      HOME EXERCISE PROGRAM: Access Code: [email or web address] Exercises - Cat Cow to Child's Pose  - 1 x daily - 7 x weekly - 10-20 reps - Prone Knee Flexion  - 1 x daily - 7 x weekly - 1-3 sets - 10 reps   DIAGNOSTIC FINDINGS:  IMPRESSION: Asymmetric activity within normal shaped striatum is favored within NORMAL LIMITS. No focal loss of dopamine transport activity to suggest Parkinson's syndrome pathology.  IMPRESSION: 1. Normal thoracic cord. 2. No acute abnormality or significant degenerative changes of the thoracic spine.  IMPRESSION: No acute or reversible finding. 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 Old small vessel infarctions of the thalami, basal ganglia and hemispheric white matter. Tiny old left occipital infarction.  COGNITION: Overall cognitive status: Within functional limits for tasks assessed   SENSATION: WFL, grossly  COORDINATION: Alt toe taps impaired, heel to shin limited by weakness and coordination defiicts  EDEMA:  none  MUSCLE TONE:  unremarkable, very emaciated appearance, very little subcutaneous fat      MUSCLE LENGTH: WFL, tight to straight leg raise position with strong passive resistance at 35-50 degrees  DTRs:  Bilateral clonus, 2-4 beat  POSTURE: slumped, sacral sitting--not fixed, able to correct with cues, slight decrease in lumbar lordosis appreciated  LOWER EXTREMITY ROM:    WFL  (Blank rows =  not tested)  LOWER EXTREMITY MMT:    MMT Right Eval Left Eval  Hip flexion 3 3  Hip extension    Hip abduction 3- 3-  Hip adduction 3 3  Hip internal rotation    Hip external rotation    Knee flexion 2+ 3  Knee extension 3+ 3+  Ankle dorsiflexion 2- 2  Ankle plantarflexion 2+ 2+  Ankle inversion    Ankle eversion    (Blank rows = not tested)  Trunk flexion: 2+/5 UE: 5/5 resisted tests.  Able to perform bodyweight dip with RW (no LE contribution)  BED MOBILITY:  Modified indep for supine<>sit Need to check rolling on larger mat table  TRANSFERS: Performs squat-pivot w/c to EOM with modified indep. Stand-pivot with RW and SBA-CGA    CURB:  DNT due to safety  STAIRS:  Pt has been non-ambulatory since 2022  GAIT: Pt has been non-ambulatory since Aug 2022  FUNCTIONAL TESTs:  Static standing: dependence on BUE   PATIENT EDUCATION: Education details: postural awareness and sitting with lumbar support and upright posture. Also in monitoring orthostatic hypotension Person educated: Patient and Spouse Education method: Explanation, Demonstration, and Tactile cues Education comprehension: verbalized understanding and needs further education      GOALS: Goals reviewed with patient? Yes  SHORT TERM GOALS: Target date: 02/16/2022  Patient will be independent in HEP to improve functional outcomes Baseline: Goal status: On-going  2.  Patient will be able to tolerate (supported) prone position x 5 min to improve lumbar extension and progress for posterior chain  strengthening Baseline: unable per report, need to trial rolling on mat table to see Goal status: on-going  3.  Maintain supported standing x 5 min to improve participation in housekeeping and ADL participation Baseline: 1-3 min w/ BUE support on RW Goal status: MET    LONG TERM GOALS: Target date: 03/09/2022  Improve trunk flexion strength to 3/5, hip flexion strength 3+/5, and lumbar extension strength 3/5 to facilitate lumbar stability and reduce pain Baseline:  Goal status: INITIAL  2.  Demonstrate ability to stand x 5 min and perform unilateral reaching/overhead 1x15 reps left/right in order to improve participation and safety with washing dishes Baseline:  Goal status: INITIAL   ASSESSMENT:  CLINICAL IMPRESSION: Progressing with unsupported standing tolerating periods of 30 sec without LE buckling and improving in supported standing activities with use of bolster to prevent knee buckling and various dynamic upper body activities.  LE weakness with difficulty in achieving foot clearance with hip extension and sidestepping reuqiring degree of physical assist for loading stability. Continued sessions indicated to progress strength, coordination, and balance to reduce risk for falls during standing ADL, mobility, housekeeping tasks.    OBJECTIVE IMPAIRMENTS Abnormal gait, decreased activity tolerance, decreased balance, decreased coordination, decreased endurance, decreased knowledge of use of DME, decreased mobility, difficulty walking, decreased strength, impaired perceived functional ability, impaired flexibility, improper body mechanics, and postural dysfunction.   ACTIVITY LIMITATIONS carrying, lifting, standing, transfers, bed mobility, reach over head, and locomotion level  PARTICIPATION LIMITATIONS: meal prep, cleaning, laundry, community activity, and yard work  PERSONAL FACTORS Age, Time since onset of injury/illness/exacerbation, and 1-2 comorbidities: neurological  condition, hx of CA  are also affecting patient's functional outcome.   REHAB POTENTIAL: Good  CLINICAL DECISION MAKING: Evolving/moderate complexity  EVALUATION COMPLEXITY: Moderate  PLAN: PT FREQUENCY: 1-2x/week  PT DURATION: 6 weeks  PLANNED INTERVENTIONS: Therapeutic exercises, Therapeutic activity, Neuromuscular re-education, Balance training, Gait training, Patient/Family education, Self Care, Joint mobilization, Joint manipulation,  Stair training, Vestibular training, Canalith repositioning, Orthotic/Fit training, DME instructions, Aquatic Therapy, Dry Needling, Electrical stimulation, Wheelchair mobility training, Spinal mobilization, Cryotherapy, Moist heat, Splintting, Taping, Ultrasound, Biofeedback, and Manual therapy  PLAN FOR NEXT SESSION: initiate HEP for hip flexion strength/trunk flexion, lumbar paraspinals.    11:53 AM, 02/23/22 M. Sherlyn Lees, PT, DPT Physical Therapist- [place] Office Number: [phone]

## 2022-02-28 ENCOUNTER — Ambulatory Visit: Payer: Medicare Other | Attending: Neurology

## 2022-02-28 DIAGNOSIS — R2689 Other abnormalities of gait and mobility: Secondary | ICD-10-CM | POA: Insufficient documentation

## 2022-02-28 DIAGNOSIS — R42 Dizziness and giddiness: Secondary | ICD-10-CM | POA: Diagnosis present

## 2022-02-28 DIAGNOSIS — R29898 Other symptoms and signs involving the musculoskeletal system: Secondary | ICD-10-CM | POA: Insufficient documentation

## 2022-02-28 DIAGNOSIS — R2681 Unsteadiness on feet: Secondary | ICD-10-CM | POA: Insufficient documentation

## 2022-02-28 DIAGNOSIS — M6281 Muscle weakness (generalized): Secondary | ICD-10-CM | POA: Diagnosis present

## 2022-02-28 NOTE — Therapy (Signed)
OUTPATIENT PHYSICAL THERAPY NEURO TREATMENT   Patient Name: Lawrence Owen MRN: [id number] DOB:17-Mar-1956, 66 y.o., male Today's Date: 02/28/2022   PCP: Marda Stalker REFERRING PROVIDER: Alda Berthold, DO    PT End of Session - 02/28/22 1148     Visit Number 8    Number of Visits 12    Date for PT Re-Evaluation 03/09/22    Authorization Type Medicare/Medicaid    PT Start Time 4718    PT Stop Time 1230    PT Time Calculation (min) 45 min    Activity Tolerance Patient tolerated treatment well    Behavior During Therapy WFL for tasks assessed/performed             Past Medical History:  Diagnosis Date   Anxiety    Cancer of tongue ([place])    neck- lymp  nodes right; treated radiation   COPD (chronic obstructive pulmonary disease) ([place])    Hypertension    Hypothyroidism    OCD (obsessive compulsive disorder)    PTSD (post-traumatic stress disorder)    Stroke ([place])    "mini stoke"    Tingling of right upper extremity    Past Surgical History:  Procedure Laterality Date   NECK SURGERY  2003   lymph node removed   TONSILLECTOMY     TRANSCAROTID ARTERY REVASCULARIZATION  Right 02/21/2019   Procedure: TRANSCAROTID ARTERY REVASCULARIZATION;  Surgeon: Marty Heck, MD;  Location: [place];  Service: Vascular;  Laterality: Right;   Patient Active Problem List   Diagnosis Date Noted   Carotid artery stenosis 02/21/2019   Carotid stenosis 01/15/2019   Stroke (cerebrum) ([place]) 03/04/2018   Alcohol use 03/04/2018   Essential hypertension 03/04/2018   Bilateral carotid artery disease ([place]) 03/04/2018   Essential hypertension, benign 03/12/2010   ALLERGIC RHINITIS 03/12/2010   HEADACHE 03/12/2010   CONTUSION, HEAD 01/27/2009   HYPERCHOLESTEROLEMIA 10/23/2008   LOW BACK PAIN SYNDROME 10/23/2008   GROIN PAIN 10/23/2008   CAROTID BRUIT, LEFT 09/18/2008   PERIODONTAL DISEASE 01/03/2008   DENTAL CARIES 03/13/2007   Hypothyroidism 02/16/2007   DISORDER,  PERSISTING AMNESTIC, DRUG-INDUCED 02/16/2007   DEPRESSION, MAJOR 02/16/2007   Pulmonary emphysema ([place]) 02/16/2007   CARCINOMA, SQUAMOUS CELL 05/30/2001    ONSET DATE: since 2021  REFERRING DIAG: R29.898 (ICD-10-CM) - Weakness of both lower extremities G95.9 (ICD-10-CM) - Myelopathy ([place])   THERAPY DIAG:  Muscle weakness (generalized)  Other abnormalities of gait and mobility  Unsteadiness on feet  Other symptoms and signs involving the musculoskeletal system  Rationale for Evaluation and Treatment Rehabilitation  SUBJECTIVE:  SUBJECTIVE STATEMENT: Using vitamin E and starting pain mgmt clinic for back pain for injections  Pt accompanied by: significant other  PERTINENT HISTORY: History of present illness: Starting ~2021, he began having difficulty walking, stating that he could not get his legs to move.  He feels that there is a disconnect between his brain and legs.  He tried using a walker but was falling frequently and has been predominately wheelchair-bound for the past few months.   For the past 6 weeks, he started having fatigue in the arms.  No muscle spasms or twitches  Myelopathy - Ddx: radiation-induced myelopathy vs primary lateral sclerosis. Evaluated at [facility] who favored radiation-induced myelopathy given that he also has sensory complaints in the upper extremities, in addition to prominent UMN findings    PAIN:  Are you having pain? No, feeling of chronic low back pain, worse when standing and extending, e.g. reaching overhead  PRECAUTIONS: Fall  WEIGHT BEARING RESTRICTIONS No  FALLS: Has patient fallen in last 6 months? No  LIVING ENVIRONMENT: Lives with: lives with their spouse Lives in: House/apartment Stairs: Yes: Internal: 12 steps; ramp to  enter/exit home Has following equipment at home: Wheelchair (power), Wheelchair (manual), and shower chair  PLOF: Needs assistance with ADLs, Needs assistance with homemaking, and Needs assistance with transfers  PATIENT GOALS be able to stand long/safely enough to perform activities like washing dishes and reaching coffee cups  OBJECTIVE:  TODAY'S TREATMENT: 02/28/22 Activity Comments  Standing at counter w/ bolster at knees -wall slides 2x10  -lateral wall slide 2x10 -overhead press 2x10 5#--using cabinet -static standing x 2 min, no UE -modified quadruped 3x10 (therapist guarding stance leg to prevent buckling) -mini-squats 4x5 reps  Sidestepping at counter X 2 min w/ min A for proximal support                 TODAY'S TREATMENT: 02/23/22 Activity Comments  Standing at counter w/ bolster at knees -static at counter x 5 min -static no UE support 3x15 sec -wall slides BUE overhead 30x -lateral wall slides 20x -modified quadruped 4x10 (therapist guarding stance leg to prevent buckling) -sidestepping w/ min A x4 lengths -sit to stand 5x5 w/ elevated chair height                     HOME EXERCISE PROGRAM: Access Code: [email or web address] Exercises - Cat Cow to Child's Pose  - 1 x daily - 7 x weekly - 10-20 reps - Prone Knee Flexion  - 1 x daily - 7 x weekly - 1-3 sets - 10 reps   DIAGNOSTIC FINDINGS:  IMPRESSION: Asymmetric activity within normal shaped striatum is favored within NORMAL LIMITS. No focal loss of dopamine transport activity to suggest Parkinson's syndrome pathology.  IMPRESSION: 1. Normal thoracic cord. 2. No acute abnormality or significant degenerative changes of the thoracic spine.  IMPRESSION: No acute or reversible finding. 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 Old small vessel infarctions of the thalami, basal ganglia and hemispheric white matter. Tiny old left occipital infarction.  COGNITION: Overall cognitive status: Within  functional limits for tasks assessed   SENSATION: WFL, grossly  COORDINATION: Alt toe taps impaired, heel to shin limited by weakness and coordination defiicts  EDEMA:  none  MUSCLE TONE: unremarkable, very emaciated appearance, very little subcutaneous fat      MUSCLE LENGTH: WFL, tight to straight leg raise position with strong passive resistance at 35-50 degrees  DTRs:  Bilateral clonus, 2-4  beat  POSTURE: slumped, sacral sitting--not fixed, able to correct with cues, slight decrease in lumbar lordosis appreciated  LOWER EXTREMITY ROM:    WFL  (Blank rows = not tested)  LOWER EXTREMITY MMT:    MMT Right Eval Left Eval  Hip flexion 3 3  Hip extension    Hip abduction 3- 3-  Hip adduction 3 3  Hip internal rotation    Hip external rotation    Knee flexion 2+ 3  Knee extension 3+ 3+  Ankle dorsiflexion 2- 2  Ankle plantarflexion 2+ 2+  Ankle inversion    Ankle eversion    (Blank rows = not tested)  Trunk flexion: 2+/5 UE: 5/5 resisted tests.  Able to perform bodyweight dip with RW (no LE contribution)  BED MOBILITY:  Modified indep for supine<>sit Need to check rolling on larger mat table  TRANSFERS: Performs squat-pivot w/c to EOM with modified indep. Stand-pivot with RW and SBA-CGA    CURB:  DNT due to safety  STAIRS:  Pt has been non-ambulatory since 2022  GAIT: Pt has been non-ambulatory since Aug 2022  FUNCTIONAL TESTs:  Static standing: dependence on BUE   PATIENT EDUCATION: Education details: postural awareness and sitting with lumbar support and upright posture. Also in monitoring orthostatic hypotension Person educated: Patient and Spouse Education method: Explanation, Demonstration, and Tactile cues Education comprehension: verbalized understanding and needs further education      GOALS: Goals reviewed with patient? Yes  SHORT TERM GOALS: Target date: 02/16/2022  Patient will be independent in HEP to improve functional  outcomes Baseline: Goal status: On-going  2.  Patient will be able to tolerate (supported) prone position x 5 min to improve lumbar extension and progress for posterior chain strengthening Baseline: unable per report, need to trial rolling on mat table to see Goal status: on-going  3.  Maintain supported standing x 5 min to improve participation in housekeeping and ADL participation Baseline: 1-3 min w/ BUE support on RW Goal status: MET    LONG TERM GOALS: Target date: 03/09/2022  Improve trunk flexion strength to 3/5, hip flexion strength 3+/5, and lumbar extension strength 3/5 to facilitate lumbar stability and reduce pain Baseline:  Goal status: INITIAL  2.  Demonstrate ability to stand x 5 min and perform unilateral reaching/overhead 1x15 reps left/right in order to improve participation and safety with washing dishes Baseline:  Goal status: INITIAL   ASSESSMENT:  CLINICAL IMPRESSION: Progressing with unsupported standing tolerating periods of 2 min without LE buckling and improving in supported standing activities with use of bolster to prevent knee buckling and various dynamic upper body activities.  LE weakness with difficulty in achieving foot clearance with hip extension and sidestepping reuqiring degree of physical assist for loading stability. Activities to simulate standing in kitchen to improve safety with ADL/housekeeping tasks. Continued sessions indicated to progress strength, coordination, and balance to reduce risk for falls during standing ADL, mobility, housekeeping tasks.    OBJECTIVE IMPAIRMENTS Abnormal gait, decreased activity tolerance, decreased balance, decreased coordination, decreased endurance, decreased knowledge of use of DME, decreased mobility, difficulty walking, decreased strength, impaired perceived functional ability, impaired flexibility, improper body mechanics, and postural dysfunction.   ACTIVITY LIMITATIONS carrying, lifting, standing,  transfers, bed mobility, reach over head, and locomotion level  PARTICIPATION LIMITATIONS: meal prep, cleaning, laundry, community activity, and yard work  PERSONAL FACTORS Age, Time since onset of injury/illness/exacerbation, and 1-2 comorbidities: neurological condition, hx of CA  are also affecting patient's functional outcome.   REHAB POTENTIAL:  Good  CLINICAL DECISION MAKING: Evolving/moderate complexity  EVALUATION COMPLEXITY: Moderate  PLAN: PT FREQUENCY: 1-2x/week  PT DURATION: 6 weeks  PLANNED INTERVENTIONS: Therapeutic exercises, Therapeutic activity, Neuromuscular re-education, Balance training, Gait training, Patient/Family education, Self Care, Joint mobilization, Joint manipulation, Stair training, Vestibular training, Canalith repositioning, Orthotic/Fit training, DME instructions, Aquatic Therapy, Dry Needling, Electrical stimulation, Wheelchair mobility training, Spinal mobilization, Cryotherapy, Moist heat, Splintting, Taping, Ultrasound, Biofeedback, and Manual therapy  PLAN FOR NEXT SESSION: prone exercises?  11:48 AM, 02/28/22 M. Sherlyn Lees, PT, DPT Physical Therapist- [place] Office Number: [phone]

## 2022-03-02 ENCOUNTER — Ambulatory Visit: Payer: Medicare Other

## 2022-03-02 DIAGNOSIS — R29898 Other symptoms and signs involving the musculoskeletal system: Secondary | ICD-10-CM

## 2022-03-02 DIAGNOSIS — M6281 Muscle weakness (generalized): Secondary | ICD-10-CM | POA: Diagnosis not present

## 2022-03-02 DIAGNOSIS — R2681 Unsteadiness on feet: Secondary | ICD-10-CM

## 2022-03-02 DIAGNOSIS — R2689 Other abnormalities of gait and mobility: Secondary | ICD-10-CM

## 2022-03-02 NOTE — Therapy (Signed)
OUTPATIENT PHYSICAL THERAPY NEURO TREATMENT   Patient Name: Lawrence Owen MRN: [id number] DOB:1956-04-13, 66 y.o., male Today's Date: 03/02/2022   PCP: Marda Stalker REFERRING PROVIDER: Alda Berthold, DO    PT End of Session - 03/02/22 1148     Visit Number 9    Number of Visits 12    Date for PT Re-Evaluation 03/09/22    Authorization Type Medicare/Medicaid    PT Start Time 4098    PT Stop Time 1230    PT Time Calculation (min) 45 min    Activity Tolerance Patient tolerated treatment well    Behavior During Therapy WFL for tasks assessed/performed             Past Medical History:  Diagnosis Date   Anxiety    Cancer of tongue ([place])    neck- lymp  nodes right; treated radiation   COPD (chronic obstructive pulmonary disease) ([place])    Hypertension    Hypothyroidism    OCD (obsessive compulsive disorder)    PTSD (post-traumatic stress disorder)    Stroke ([place])    "mini stoke"    Tingling of right upper extremity    Past Surgical History:  Procedure Laterality Date   NECK SURGERY  2003   lymph node removed   TONSILLECTOMY     TRANSCAROTID ARTERY REVASCULARIZATION  Right 02/21/2019   Procedure: TRANSCAROTID ARTERY REVASCULARIZATION;  Surgeon: Marty Heck, MD;  Location: [facility];  Service: Vascular;  Laterality: Right;   Patient Active Problem List   Diagnosis Date Noted   Carotid artery stenosis 02/21/2019   Carotid stenosis 01/15/2019   Stroke (cerebrum) ([place]) 03/04/2018   Alcohol use 03/04/2018   Essential hypertension 03/04/2018   Bilateral carotid artery disease ([place]) 03/04/2018   Essential hypertension, benign 03/12/2010   ALLERGIC RHINITIS 03/12/2010   HEADACHE 03/12/2010   CONTUSION, HEAD 01/27/2009   HYPERCHOLESTEROLEMIA 10/23/2008   LOW BACK PAIN SYNDROME 10/23/2008   GROIN PAIN 10/23/2008   CAROTID BRUIT, LEFT 09/18/2008   PERIODONTAL DISEASE 01/03/2008   DENTAL CARIES 03/13/2007   Hypothyroidism 02/16/2007   DISORDER,  PERSISTING AMNESTIC, DRUG-INDUCED 02/16/2007   DEPRESSION, MAJOR 02/16/2007   Pulmonary emphysema ([place]) 02/16/2007   CARCINOMA, SQUAMOUS CELL 05/30/2001    ONSET DATE: since 2021  REFERRING DIAG: R29.898 (ICD-10-CM) - Weakness of both lower extremities G95.9 (ICD-10-CM) - Myelopathy ([place])   THERAPY DIAG:  Muscle weakness (generalized)  Other abnormalities of gait and mobility  Unsteadiness on feet  Other symptoms and signs involving the musculoskeletal system  Rationale for Evaluation and Treatment Rehabilitation  SUBJECTIVE:  SUBJECTIVE STATEMENT: Still about the same  Pt accompanied by: self  PERTINENT HISTORY: History of present illness: Starting ~2021, he began having difficulty walking, stating that he could not get his legs to move.  He feels that there is a disconnect between his brain and legs.  He tried using a walker but was falling frequently and has been predominately wheelchair-bound for the past few months.   For the past 6 weeks, he started having fatigue in the arms.  No muscle spasms or twitches  Myelopathy - Ddx: radiation-induced myelopathy vs primary lateral sclerosis. Evaluated at [facility] who favored radiation-induced myelopathy given that he also has sensory complaints in the upper extremities, in addition to prominent UMN findings    PAIN:  Are you having pain? No, feeling of chronic low back pain, worse when standing and extending, e.g. reaching overhead  PRECAUTIONS: Fall  WEIGHT BEARING RESTRICTIONS No  FALLS: Has patient fallen in last 6 months? No  LIVING ENVIRONMENT: Lives with: lives with their spouse Lives in: House/apartment Stairs: Yes: Internal: 12 steps; ramp to enter/exit home Has following equipment at home: Wheelchair (power),  Wheelchair (manual), and shower chair  PLOF: Needs assistance with ADLs, Needs assistance with homemaking, and Needs assistance with transfers  PATIENT GOALS be able to stand long/safely enough to perform activities like washing dishes and reaching coffee cups  OBJECTIVE:  TODAY'S TREATMENT: 03/02/22 Activity Comments  NU-step level 4 x 6 min for alt coordination   Supine ther ex -SAQ 3x10 -Hip add 3x10 -Assisted heel slides 3x10 3x10 RLE weaker than LLE -Bridges 3x10, therapist stabilization/tactile cues for hip abd -Sidelying clamshells 3x10 -prone hamstring AAROM 2x10                TODAY'S TREATMENT: 02/28/22 Activity Comments  Standing at counter w/ bolster at knees -wall slides 2x10  -lateral wall slide 2x10 -overhead press 2x10 5#--using cabinet -static standing x 2 min, no UE -modified quadruped 3x10 (therapist guarding stance leg to prevent buckling) -mini-squats 4x5 reps  Sidestepping at counter X 2 min w/ min A for proximal support                 _0     HOME EXERCISE PROGRAM: Access Code: [email or web address] Exercises - Cat Cow to Child's Pose  - 1 x daily - 7 x weekly - 10-20 reps - Prone Knee Flexion  - 1 x daily - 7 x weekly - 1-3 sets - 10 reps   DIAGNOSTIC FINDINGS:  IMPRESSION: Asymmetric activity within normal shaped striatum is favored within NORMAL LIMITS. No focal loss of dopamine transport activity to suggest Parkinson's syndrome pathology.  IMPRESSION: 1. Normal thoracic cord. 2. No acute abnormality or significant degenerative changes of the thoracic spine.  IMPRESSION: No acute or reversible finding. 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 Old small vessel infarctions of the thalami, basal ganglia and hemispheric white matter. Tiny old left occipital infarction.  COGNITION: Overall cognitive status: Within functional limits for tasks assessed   SENSATION: WFL, grossly  COORDINATION: Alt toe taps impaired, heel to shin  limited by weakness and coordination defiicts  EDEMA:  none  MUSCLE TONE: unremarkable, very emaciated appearance, very little subcutaneous fat      MUSCLE LENGTH: WFL, tight to straight leg raise position with strong passive resistance at 35-50 degrees  DTRs:  Bilateral clonus, 2-4 beat  POSTURE: slumped, sacral sitting--not fixed, able to correct with cues, slight decrease in lumbar lordosis appreciated  LOWER EXTREMITY ROM:    WFL  (Blank rows = not tested)  LOWER EXTREMITY MMT:    MMT Right Eval Left Eval  Hip flexion 3 3  Hip extension    Hip abduction 3- 3-  Hip adduction 3 3  Hip internal rotation    Hip external rotation    Knee flexion 2+ 3  Knee extension 3+ 3+  Ankle dorsiflexion 2- 2  Ankle plantarflexion 2+ 2+  Ankle inversion    Ankle eversion    (Blank rows = not tested)  Trunk flexion: 2+/5 UE: 5/5 resisted tests.  Able to perform bodyweight dip with RW (no LE contribution)  BED MOBILITY:  Modified indep for supine<>sit Need to check rolling on larger mat table  TRANSFERS: Performs squat-pivot w/c to EOM with modified indep. Stand-pivot with RW and SBA-CGA    CURB:  DNT due to safety  STAIRS:  Pt has been non-ambulatory since 2022  GAIT: Pt has been non-ambulatory since Aug 2022  FUNCTIONAL TESTs:  Static standing: dependence on BUE   PATIENT EDUCATION: Education details: postural awareness and sitting with lumbar support and upright posture. Also in monitoring orthostatic hypotension Person educated: Patient and Spouse Education method: Explanation, Demonstration, and Tactile cues Education comprehension: verbalized understanding and needs further education      GOALS: Goals reviewed with patient? Yes  SHORT TERM GOALS: Target date: 02/16/2022  Patient will be independent in HEP to improve functional outcomes Baseline: Goal status: On-going  2.  Patient will be able to tolerate (supported) prone position x 5 min to  improve lumbar extension and progress for posterior chain strengthening Baseline: unable per report, need to trial rolling on mat table to see Goal status: on-going  3.  Maintain supported standing x 5 min to improve participation in housekeeping and ADL participation Baseline: 1-3 min w/ BUE support on RW Goal status: MET    LONG TERM GOALS: Target date: 03/09/2022  Improve trunk flexion strength to 3/5, hip flexion strength 3+/5, and lumbar extension strength 3/5 to facilitate lumbar stability and reduce pain Baseline:  Goal status: INITIAL  2.  Demonstrate ability to stand x 5 min and perform unilateral reaching/overhead 1x15 reps left/right in order to improve participation and safety with washing dishes Baseline:  Goal status: INITIAL   ASSESSMENT:  CLINICAL IMPRESSION: Demonstrates prominent weakness in bilateral hip flexors, hip abductors, hamstrings, and hip extensors; less so in quadriceps, dorsiflexors, and gastroc.  Tolerated isolated strengthening activities without adverse effect and able to appreciate palpable contractions the above mentioned mm groups but not enough power to generate isolate joint movements. Continued sessions to refine HEP and mobility activities   OBJECTIVE IMPAIRMENTS Abnormal gait, decreased activity tolerance, decreased balance, decreased coordination, decreased endurance, decreased knowledge of use of DME, decreased mobility, difficulty walking, decreased strength, impaired perceived functional ability, impaired flexibility, improper body mechanics, and postural dysfunction.   ACTIVITY LIMITATIONS carrying, lifting, standing, transfers, bed mobility, reach over head, and locomotion level  PARTICIPATION LIMITATIONS: meal prep, cleaning, laundry, community activity, and yard work  PERSONAL FACTORS Age, Time since onset of injury/illness/exacerbation, and 1-2 comorbidities: neurological condition, hx of CA  are also affecting patient's functional  outcome.   REHAB POTENTIAL: Good  CLINICAL DECISION MAKING: Evolving/moderate complexity  EVALUATION COMPLEXITY: Moderate  PLAN: PT FREQUENCY: 1-2x/week  PT DURATION: 6 weeks  PLANNED INTERVENTIONS: Therapeutic exercises, Therapeutic activity, Neuromuscular re-education, Balance training, Gait training, Patient/Family education, Self Care, Joint mobilization, Joint manipulation, Stair training, Vestibular training, Canalith repositioning, Orthotic/Fit training,  DME instructions, Aquatic Therapy, Dry Needling, Electrical stimulation, Wheelchair mobility training, Spinal mobilization, Cryotherapy, Moist heat, Splintting, Taping, Ultrasound, Biofeedback, and Manual therapy  PLAN FOR NEXT SESSION: prone exercises?  11:48 AM, 03/02/22 M. Sherlyn Lees, PT, DPT Physical Therapist- [place] Office Number: [phone]

## 2022-03-07 ENCOUNTER — Ambulatory Visit: Payer: Medicare Other

## 2022-03-07 DIAGNOSIS — M6281 Muscle weakness (generalized): Secondary | ICD-10-CM | POA: Diagnosis not present

## 2022-03-07 DIAGNOSIS — R2681 Unsteadiness on feet: Secondary | ICD-10-CM

## 2022-03-07 DIAGNOSIS — R2689 Other abnormalities of gait and mobility: Secondary | ICD-10-CM

## 2022-03-07 DIAGNOSIS — R29898 Other symptoms and signs involving the musculoskeletal system: Secondary | ICD-10-CM

## 2022-03-07 NOTE — Therapy (Signed)
OUTPATIENT PHYSICAL THERAPY NEURO TREATMENT and PROGRESS NOTE   Patient Name: JAMAN ARO MRN: [id number] DOB:19-Jun-1955, 66 y.o., male Today's Date: 03/07/2022   PCP: Marda Stalker REFERRING PROVIDER: Alda Berthold, DO   Progress Note Reporting Period 01/26/22 to 03/07/22  See note below for Objective Data and Assessment of Progress/Goals.      PT End of Session - 03/07/22 1147     Visit Number 10    Number of Visits 12    Date for PT Re-Evaluation 03/09/22    Authorization Type Medicare/Medicaid    PT Start Time 1145    PT Stop Time 1230    PT Time Calculation (min) 45 min    Activity Tolerance Patient tolerated treatment well    Behavior During Therapy WFL for tasks assessed/performed             Past Medical History:  Diagnosis Date   Anxiety    Cancer of tongue ([place])    neck- lymp  nodes right; treated radiation   COPD (chronic obstructive pulmonary disease) ([facility])    Hypertension    Hypothyroidism    OCD (obsessive compulsive disorder)    PTSD (post-traumatic stress disorder)    Stroke ([place])    "mini stoke"    Tingling of right upper extremity    Past Surgical History:  Procedure Laterality Date   NECK SURGERY  2003   lymph node removed   TONSILLECTOMY     TRANSCAROTID ARTERY REVASCULARIZATION  Right 02/21/2019   Procedure: TRANSCAROTID ARTERY REVASCULARIZATION;  Surgeon: Marty Heck, MD;  Location: [facility];  Service: Vascular;  Laterality: Right;   Patient Active Problem List   Diagnosis Date Noted   Carotid artery stenosis 02/21/2019   Carotid stenosis 01/15/2019   Stroke (cerebrum) ([place]) 03/04/2018   Alcohol use 03/04/2018   Essential hypertension 03/04/2018   Bilateral carotid artery disease ([place]) 03/04/2018   Essential hypertension, benign 03/12/2010   ALLERGIC RHINITIS 03/12/2010   HEADACHE 03/12/2010   CONTUSION, HEAD 01/27/2009   HYPERCHOLESTEROLEMIA 10/23/2008   LOW BACK PAIN SYNDROME 10/23/2008   GROIN PAIN  10/23/2008   CAROTID BRUIT, LEFT 09/18/2008   PERIODONTAL DISEASE 01/03/2008   DENTAL CARIES 03/13/2007   Hypothyroidism 02/16/2007   DISORDER, PERSISTING AMNESTIC, DRUG-INDUCED 02/16/2007   DEPRESSION, MAJOR 02/16/2007   Pulmonary emphysema ([place]) 02/16/2007   CARCINOMA, SQUAMOUS CELL 05/30/2001    ONSET DATE: since 2021  REFERRING DIAG: R29.898 (ICD-10-CM) - Weakness of both lower extremities G95.9 (ICD-10-CM) - Myelopathy ([facility])   THERAPY DIAG:  Muscle weakness (generalized)  Other abnormalities of gait and mobility  Other symptoms and signs involving the musculoskeletal system  Unsteadiness on feet  Rationale for Evaluation and Treatment Rehabilitation  SUBJECTIVE:  SUBJECTIVE STATEMENT: Still about the same  Pt accompanied by: self  PERTINENT HISTORY: History of present illness: Starting ~2021, he began having difficulty walking, stating that he could not get his legs to move.  He feels that there is a disconnect between his brain and legs.  He tried using a walker but was falling frequently and has been predominately wheelchair-bound for the past few months.   For the past 6 weeks, he started having fatigue in the arms.  No muscle spasms or twitches  Myelopathy - Ddx: radiation-induced myelopathy vs primary lateral sclerosis. Evaluated at [facility] who favored radiation-induced myelopathy given that he also has sensory complaints in the upper extremities, in addition to prominent UMN findings    PAIN:  Are you having pain? No, feeling of chronic low back pain, worse when standing and extending, e.g. reaching overhead  PRECAUTIONS: Fall  WEIGHT BEARING RESTRICTIONS No  FALLS: Has patient fallen in last 6 months? No  LIVING ENVIRONMENT: Lives with: lives with their  spouse Lives in: House/apartment Stairs: Yes: Internal: 12 steps; ramp to enter/exit home Has following equipment at home: Wheelchair (power), Wheelchair (manual), and shower chair  PLOF: Needs assistance with ADLs, Needs assistance with homemaking, and Needs assistance with transfers  PATIENT GOALS be able to stand long/safely enough to perform activities like washing dishes and reaching coffee cups  OBJECTIVE:  TODAY'S TREATMENT: 03/07/22 Activity Comments  Static standing X 5 min at countertop, intermittent UE support  Prone position X 5 min supported, prone elbows -hip extension 2+/5. Left hamstrings 3+/5, Right hamstrings 3-/5. Hip abductors 2/5  Supine strength/flexibility -butterfly bridge -SKTC -DKTC -static hamstring stretch -assisted piriformis stretch  Reports he has not attempted washing dishes           TODAY'S TREATMENT: 03/02/22 Activity Comments  NU-step level 4 x 6 min for alt coordination   Supine ther ex -SAQ 3x10 -Hip add 3x10 -Assisted heel slides 3x10 3x10 RLE weaker than LLE -Bridges 3x10, therapist stabilization/tactile cues for hip abd -Sidelying clamshells 3x10 -prone hamstring AAROM 2x10                   HOME EXERCISE PROGRAM: Access Code: [email or web address] Exercises - Cat Cow to Child's Pose  - 1 x daily - 7 x weekly - 10-20 reps - Prone Knee Flexion  - 1 x daily - 7 x weekly - 1-3 sets - 10 reps   DIAGNOSTIC FINDINGS:  IMPRESSION: Asymmetric activity within normal shaped striatum is favored within NORMAL LIMITS. No focal loss of dopamine transport activity to suggest Parkinson's syndrome pathology.  IMPRESSION: 1. Normal thoracic cord. 2. No acute abnormality or significant degenerative changes of the thoracic spine.  IMPRESSION: No acute or reversible finding. 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 Old small vessel infarctions of the thalami, basal ganglia and hemispheric white matter. Tiny old left occipital  infarction.  COGNITION: Overall cognitive status: Within functional limits for tasks assessed   SENSATION: WFL, grossly  COORDINATION: Alt toe taps impaired, heel to shin limited by weakness and coordination defiicts  EDEMA:  none  MUSCLE TONE: unremarkable, very emaciated appearance, very little subcutaneous fat      MUSCLE LENGTH: WFL, tight to straight leg raise position with strong passive resistance at 35-50 degrees  DTRs:  Bilateral clonus, 2-4 beat  POSTURE: slumped, sacral sitting--not fixed, able to correct with cues, slight decrease in lumbar lordosis appreciated  LOWER EXTREMITY ROM:    WFL  (Blank   rows = not tested)  LOWER EXTREMITY MMT:    MMT Right Eval Left Eval  Hip flexion 3 3  Hip extension    Hip abduction 3- 3-  Hip adduction 3 3  Hip internal rotation    Hip external rotation    Knee flexion 2+ 3  Knee extension 3+ 3+  Ankle dorsiflexion 2- 2  Ankle plantarflexion 2+ 2+  Ankle inversion    Ankle eversion    (Blank rows = not tested)  Trunk flexion: 2+/5 UE: 5/5 resisted tests.  Able to perform bodyweight dip with RW (no LE contribution)  BED MOBILITY:  Modified indep for supine<>sit Need to check rolling on larger mat table  TRANSFERS: Performs squat-pivot w/c to EOM with modified indep. Stand-pivot with RW and SBA-CGA    CURB:  DNT due to safety  STAIRS:  Pt has been non-ambulatory since 2022  GAIT: Pt has been non-ambulatory since Aug 2022  FUNCTIONAL TESTs:  Static standing: dependence on BUE   PATIENT EDUCATION: Education details: postural awareness and sitting with lumbar support and upright posture. Also in monitoring orthostatic hypotension Person educated: Patient and Spouse Education method: Explanation, Demonstration, and Tactile cues Education comprehension: verbalized understanding and needs further education      GOALS: Goals reviewed with patient? Yes  SHORT TERM GOALS: Target date:  02/16/2022  Patient will be independent in HEP to improve functional outcomes Baseline: Goal status: MET  2.  Patient will be able to tolerate (supported) prone position x 5 min to improve lumbar extension and progress for posterior chain strengthening Baseline: one-pillow support Goal status: MET  3.  Maintain supported standing x 5 min to improve participation in housekeeping and ADL participation Baseline: 1-3 min w/ BUE support on RW; 5+ min at counter with intermittent UE support, able to maintain unsupported standing 80% of the trial Goal status: MET    LONG TERM GOALS: Target date: 03/09/2022  Improve trunk flexion strength to 3/5, hip flexion strength 3+/5, and lumbar extension strength 3/5 to facilitate lumbar stability and reduce pain Baseline: 2+/5 flexion; 3/5 lumbar extension Goal status: On-going  2.  Demonstrate ability to stand x 5 min and perform unilateral reaching/overhead 1x15 reps left/right in order to improve participation and safety with washing dishes Baseline:  Goal status: MET   ASSESSMENT:  CLINICAL IMPRESSION: Overall demonstrates improvements in static standing balance tolerating time of 5 min with only brief UE support needed and able to achieve full LE extension in standing and able to perform reaching outside BOS with unilateral UE support.  Main deficits continues to be LE weakness w/ more prominent weakness affecting his proximal/pelvic muscles and noted that there is 3-4 beat clonus in left ankle plantarflexors.  Will perform one additional session for caregiver training and D/C to HEP   OBJECTIVE IMPAIRMENTS Abnormal gait, decreased activity tolerance, decreased balance, decreased coordination, decreased endurance, decreased knowledge of use of DME, decreased mobility, difficulty walking, decreased strength, impaired perceived functional ability, impaired flexibility, improper body mechanics, and postural dysfunction.   ACTIVITY LIMITATIONS  carrying, lifting, standing, transfers, bed mobility, reach over head, and locomotion level  PARTICIPATION LIMITATIONS: meal prep, cleaning, laundry, community activity, and yard work  PERSONAL FACTORS Age, Time since onset of injury/illness/exacerbation, and 1-2 comorbidities: neurological condition, hx of CA  are also affecting patient's functional outcome.   REHAB POTENTIAL: Good  CLINICAL DECISION MAKING: Evolving/moderate complexity  EVALUATION COMPLEXITY: Moderate  PLAN: PT FREQUENCY: 1-2x/week  PT DURATION: 6 weeks  PLANNED INTERVENTIONS: Therapeutic   exercises, Therapeutic activity, Neuromuscular re-education, Balance training, Gait training, Patient/Family education, Self Care, Joint mobilization, Joint manipulation, Stair training, Vestibular training, Canalith repositioning, Orthotic/Fit training, DME instructions, Aquatic Therapy, Dry Needling, Electrical stimulation, Wheelchair mobility training, Spinal mobilization, Cryotherapy, Moist heat, Splintting, Taping, Ultrasound, Biofeedback, and Manual therapy  PLAN FOR NEXT SESSION: caregiver training?  11:48 AM, 03/07/22 M. Kelly Halpin, PT, DPT Physical Therapist- [place] Office Number: [phone]         

## 2022-03-09 ENCOUNTER — Ambulatory Visit: Payer: Medicare Other

## 2022-03-09 DIAGNOSIS — R2689 Other abnormalities of gait and mobility: Secondary | ICD-10-CM

## 2022-03-09 DIAGNOSIS — M6281 Muscle weakness (generalized): Secondary | ICD-10-CM

## 2022-03-09 DIAGNOSIS — R29898 Other symptoms and signs involving the musculoskeletal system: Secondary | ICD-10-CM

## 2022-03-09 DIAGNOSIS — R2681 Unsteadiness on feet: Secondary | ICD-10-CM

## 2022-03-09 NOTE — Therapy (Signed)
OUTPATIENT PHYSICAL THERAPY NEURO TREATMENT and Recertification   Patient Name: Lawrence Owen MRN: [id number] DOB:May 19, 1956, 66 y.o., male Today's Date: 03/09/2022   PCP: Marda Stalker REFERRING PROVIDER: Alda Berthold, DO     PT End of Session - 03/09/22 1147     Visit Number 11    Number of Visits 24    Date for PT Re-Evaluation 04/20/22    Authorization Type Medicare/Medicaid    Progress Note Due on Visit 86    PT Start Time 4098    PT Stop Time 1230    PT Time Calculation (min) 45 min    Activity Tolerance Patient tolerated treatment well    Behavior During Therapy WFL for tasks assessed/performed             Past Medical History:  Diagnosis Date   Anxiety    Cancer of tongue ([place])    neck- lymp  nodes right; treated radiation   COPD (chronic obstructive pulmonary disease) ([place])    Hypertension    Hypothyroidism    OCD (obsessive compulsive disorder)    PTSD (post-traumatic stress disorder)    Stroke ([place])    "mini stoke"    Tingling of right upper extremity    Past Surgical History:  Procedure Laterality Date   NECK SURGERY  2003   lymph node removed   TONSILLECTOMY     TRANSCAROTID ARTERY REVASCULARIZATION  Right 02/21/2019   Procedure: TRANSCAROTID ARTERY REVASCULARIZATION;  Surgeon: Marty Heck, MD;  Location: [place];  Service: Vascular;  Laterality: Right;   Patient Active Problem List   Diagnosis Date Noted   Carotid artery stenosis 02/21/2019   Carotid stenosis 01/15/2019   Stroke (cerebrum) ([place]) 03/04/2018   Alcohol use 03/04/2018   Essential hypertension 03/04/2018   Bilateral carotid artery disease ([place]) 03/04/2018   Essential hypertension, benign 03/12/2010   ALLERGIC RHINITIS 03/12/2010   HEADACHE 03/12/2010   CONTUSION, HEAD 01/27/2009   HYPERCHOLESTEROLEMIA 10/23/2008   LOW BACK PAIN SYNDROME 10/23/2008   GROIN PAIN 10/23/2008   CAROTID BRUIT, LEFT 09/18/2008   PERIODONTAL DISEASE 01/03/2008   DENTAL CARIES  03/13/2007   Hypothyroidism 02/16/2007   DISORDER, PERSISTING AMNESTIC, DRUG-INDUCED 02/16/2007   DEPRESSION, MAJOR 02/16/2007   Pulmonary emphysema ([place]) 02/16/2007   CARCINOMA, SQUAMOUS CELL 05/30/2001    ONSET DATE: since 2021  REFERRING DIAG: R29.898 (ICD-10-CM) - Weakness of both lower extremities G95.9 (ICD-10-CM) - Myelopathy ([place])   THERAPY DIAG:  Muscle weakness (generalized)  Other abnormalities of gait and mobility  Other symptoms and signs involving the musculoskeletal system  Unsteadiness on feet  Rationale for Evaluation and Treatment Rehabilitation  SUBJECTIVE:  SUBJECTIVE STATEMENT: Overall doing better and able to stand at kitchen sink. Having dizziness/lightheaded with certain movements and positions.   Pt accompanied by: self  PERTINENT HISTORY: History of present illness: Starting ~2021, he began having difficulty walking, stating that he could not get his legs to move.  He feels that there is a disconnect between his brain and legs.  He tried using a walker but was falling frequently and has been predominately wheelchair-bound for the past few months.   For the past 6 weeks, he started having fatigue in the arms.  No muscle spasms or twitches  Myelopathy - Ddx: radiation-induced myelopathy vs primary lateral sclerosis. Evaluated at [facility] who favored radiation-induced myelopathy given that he also has sensory complaints in the upper extremities, in addition to prominent UMN findings    PAIN:  Are you having pain? No, feeling of chronic low back pain, worse when standing and extending, e.g. reaching overhead  PRECAUTIONS: Fall  WEIGHT BEARING RESTRICTIONS No  FALLS: Has patient fallen in last 6 months? No  LIVING ENVIRONMENT: Lives with: lives with  their spouse Lives in: House/apartment Stairs: Yes: Internal: 12 steps; ramp to enter/exit home Has following equipment at home: Wheelchair (power), Wheelchair (manual), and shower chair  PLOF: Needs assistance with ADLs, Needs assistance with homemaking, and Needs assistance with transfers  PATIENT GOALS be able to stand long/safely enough to perform activities like washing dishes and reaching coffee cups  OBJECTIVE:  TODAY'S TREATMENT: 03/09/22 Activity Comments  Standing at sink X 7 min  Vestibular assessment Saccades: WNL Smooth pursuits: WNL Convergence: WNL No spontaneous nystagmus Head Impulse test: +left VOR: greater dysfunction w/ vertical vs horizontal VOR cancellation: difficulty in tracking Horizontal roll test: negative Dix-Hallpike: left negative Dix-Hallpike: right positive.  Epley maneuver right              TODAY'S TREATMENT: 03/07/22 Activity Comments  Static standing X 5 min at countertop, intermittent UE support  Prone position X 5 min supported, prone elbows -hip extension 2+/5. Left hamstrings 3+/5, Right hamstrings 3-/5. Hip abductors 2/5  Supine strength/flexibility -butterfly bridge -SKTC -DKTC -static hamstring stretch -assisted piriformis stretch  Reports he has not attempted washing dishes                HOME EXERCISE PROGRAM: Access Code: [email or web address] Exercises - Cat Cow to Child's Pose  - 1 x daily - 7 x weekly - 10-20 reps - Prone Knee Flexion  - 1 x daily - 7 x weekly - 1-3 sets - 10 reps   DIAGNOSTIC FINDINGS:  IMPRESSION: Asymmetric activity within normal shaped striatum is favored within NORMAL LIMITS. No focal loss of dopamine transport activity to suggest Parkinson's syndrome pathology.  IMPRESSION: 1. Normal thoracic cord. 2. No acute abnormality or significant degenerative changes of the thoracic spine.  IMPRESSION: No acute or reversible finding. 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 Old small  vessel infarctions of the thalami, basal ganglia and hemispheric white matter. Tiny old left occipital infarction.  COGNITION: Overall cognitive status: Within functional limits for tasks assessed   SENSATION: WFL, grossly  COORDINATION: Alt toe taps impaired, heel to shin limited by weakness and coordination defiicts  EDEMA:  none  MUSCLE TONE: unremarkable, very emaciated appearance, very little subcutaneous fat      MUSCLE LENGTH: WFL, tight to straight leg raise position with strong passive resistance at 35-50 degrees  DTRs:  Bilateral clonus, 2-4 beat  POSTURE: slumped, sacral sitting--not fixed, able  to correct with cues, slight decrease in lumbar lordosis appreciated  LOWER EXTREMITY ROM:    WFL  (Blank rows = not tested)  LOWER EXTREMITY MMT:    MMT Right Eval Left Eval  Hip flexion 3 3  Hip extension    Hip abduction 2- 2-  Hip adduction 3 3  Hip internal rotation    Hip external rotation    Knee flexion 2+ 3  Knee extension 3+ 3+  Ankle dorsiflexion 2+ 2  Ankle plantarflexion 2+ 2+  Ankle inversion    Ankle eversion    (Blank rows = not tested)  Trunk flexion: 2+/5 UE: 5/5 resisted tests.  Able to perform bodyweight dip with RW (no LE contribution)  BED MOBILITY:  Modified indep for supine<>sit   TRANSFERS: Performs squat-pivot w/c to EOM with modified indep. Stand-pivot with RW and Supervision    CURB:  DNT due to safety  STAIRS:  Pt has been non-ambulatory since 2022  GAIT: Pt has been non-ambulatory since Aug 2022  FUNCTIONAL TESTs:  Static standing: Able to maintain unsupported standing 30-45 sec increments   PATIENT EDUCATION: Education details: postural awareness and sitting with lumbar support and upright posture. Also in monitoring orthostatic hypotension Person educated: Patient and Spouse Education method: Explanation, Demonstration, and Tactile cues Education comprehension: verbalized understanding and needs  further education      GOALS: Goals reviewed with patient? Yes  SHORT TERM GOALS: Target date: UPDATED/REVISED: 03/30/22  Patient will be independent in HEP to improve functional outcomes Baseline: Goal status: MET  2.  Patient will be able to tolerate (supported) prone position x 5 min to improve lumbar extension and progress for posterior chain strengthening Baseline: one-pillow support Goal status: MET  3.  Maintain supported standing x 5 min to improve participation in housekeeping and ADL participation Baseline: 1-3 min w/ BUE support on RW; 5+ min at counter with intermittent UE support, able to maintain unsupported standing 80% of the trial Goal status: MET  4. The patient will be independent with HEP for gaze adaptation, habituation, balance, and general mobility.  Baseline: newly initiated  Goals status: INITIAL  5. Maintain unsupported standing x 2 min to improve safety with housekeeping and ADL participation  Baseline: 30 sec  Goal status: INITIAL    LONG TERM GOALS: Target date: REVISED/UPDATED 04/21/22  Improve trunk flexion strength to 3/5, hip flexion strength 3+/5, and lumbar extension strength 3/5 to facilitate lumbar stability and reduce pain Baseline: 2+/5 flexion; 3/5 lumbar extension Goal status: On-going  2.  Demonstrate ability to stand x 5 min and perform unilateral reaching/overhead 1x15 reps left/right in order to improve participation and safety with washing dishes Baseline:  Goal status: MET  3. Demonstrate unsupported standing x 3 min to improve safety with housekeeping and ADL participation.  Baseline:  Goal status: INITIAL  4. Report/demonstrate improved standing balance and tolerance to enable washing sink full of dishes.  Baseline: unable due to safety concerns  Goal status: INITIAL   ASSESSMENT:  CLINICAL IMPRESSION: Completed re-assessment on today's date and pt reporting issues of oscillopsia and dizziness/lightheadedness with  certain positions and demonstrates issues and symptoms consistent with vestibular hypofunction as well as positional symptoms given presentation with Dix-Hallpike. Demonstrates improvements in mobility and standing tolerance and is willing to initiate more trials of housekeeping tasks.  Continued services indicated to now initiate vestibular rehabilitation to address continued mobility deficits and issues involving equilibrium and VOR dysfunction.    OBJECTIVE IMPAIRMENTS Abnormal gait, decreased activity tolerance,  decreased balance, decreased coordination, decreased endurance, decreased knowledge of use of DME, decreased mobility, difficulty walking, decreased strength, dizziness, impaired perceived functional ability, impaired flexibility, improper body mechanics, and postural dysfunction.   ACTIVITY LIMITATIONS carrying, lifting, standing, transfers, bed mobility, reach over head, and locomotion level  PARTICIPATION LIMITATIONS: meal prep, cleaning, laundry, community activity, and yard work  PERSONAL FACTORS Age, Time since onset of injury/illness/exacerbation, and 1-2 comorbidities: neurological condition, hx of CA  are also affecting patient's functional outcome.   REHAB POTENTIAL: Good  CLINICAL DECISION MAKING: Evolving/moderate complexity  EVALUATION COMPLEXITY: Moderate  PLAN: PT FREQUENCY: 1-2x/week  PT DURATION: 6 weeks  PLANNED INTERVENTIONS: Therapeutic exercises, Therapeutic activity, Neuromuscular re-education, Balance training, Gait training, Patient/Family education, Self Care, Joint mobilization, Joint manipulation, Stair training, Vestibular training, Canalith repositioning, Orthotic/Fit training, DME instructions, Aquatic Therapy, Dry Needling, Electrical stimulation, Wheelchair mobility training, Spinal mobilization, Cryotherapy, Moist heat, Splintting, Taping, Ultrasound, Biofeedback, and Manual therapy  PLAN FOR NEXT SESSION: Dynamic Visual Acuity, VOR x 1  12:50  PM, 03/09/22 M. Sherlyn Lees, PT, DPT Physical Therapist- [place] Office Number: [phone]

## 2022-03-15 ENCOUNTER — Ambulatory Visit: Payer: Medicare Other

## 2022-03-15 DIAGNOSIS — R2681 Unsteadiness on feet: Secondary | ICD-10-CM

## 2022-03-15 DIAGNOSIS — R2689 Other abnormalities of gait and mobility: Secondary | ICD-10-CM

## 2022-03-15 DIAGNOSIS — R29898 Other symptoms and signs involving the musculoskeletal system: Secondary | ICD-10-CM

## 2022-03-15 DIAGNOSIS — M6281 Muscle weakness (generalized): Secondary | ICD-10-CM | POA: Diagnosis not present

## 2022-03-15 NOTE — Therapy (Signed)
OUTPATIENT PHYSICAL THERAPY NEURO TREATMENT    Patient Name: Lawrence Owen MRN: [id number] DOB:11/09/1955, 66 y.o., male Today's Date: 03/15/2022   PCP: Marda Stalker REFERRING PROVIDER: Alda Berthold, DO     PT End of Session - 03/15/22 0851     Visit Number 12    Number of Visits 24    Date for PT Re-Evaluation 04/20/22    Authorization Type Medicare/Medicaid    Progress Note Due on Visit 62    PT Start Time 0845    PT Stop Time 0930    PT Time Calculation (min) 45 min    Activity Tolerance Patient tolerated treatment well    Behavior During Therapy WFL for tasks assessed/performed             Past Medical History:  Diagnosis Date   Anxiety    Cancer of tongue ([place])    neck- lymp  nodes right; treated radiation   COPD (chronic obstructive pulmonary disease) ([place])    Hypertension    Hypothyroidism    OCD (obsessive compulsive disorder)    PTSD (post-traumatic stress disorder)    Stroke ([place])    "mini stoke"    Tingling of right upper extremity    Past Surgical History:  Procedure Laterality Date   NECK SURGERY  2003   lymph node removed   TONSILLECTOMY     TRANSCAROTID ARTERY REVASCULARIZATION  Right 02/21/2019   Procedure: TRANSCAROTID ARTERY REVASCULARIZATION;  Surgeon: Marty Heck, MD;  Location: [facility];  Service: Vascular;  Laterality: Right;   Patient Active Problem List   Diagnosis Date Noted   Carotid artery stenosis 02/21/2019   Carotid stenosis 01/15/2019   Stroke (cerebrum) ([place]) 03/04/2018   Alcohol use 03/04/2018   Essential hypertension 03/04/2018   Bilateral carotid artery disease ([place]) 03/04/2018   Essential hypertension, benign 03/12/2010   ALLERGIC RHINITIS 03/12/2010   HEADACHE 03/12/2010   CONTUSION, HEAD 01/27/2009   HYPERCHOLESTEROLEMIA 10/23/2008   LOW BACK PAIN SYNDROME 10/23/2008   GROIN PAIN 10/23/2008   CAROTID BRUIT, LEFT 09/18/2008   PERIODONTAL DISEASE 01/03/2008   DENTAL CARIES 03/13/2007    Hypothyroidism 02/16/2007   DISORDER, PERSISTING AMNESTIC, DRUG-INDUCED 02/16/2007   DEPRESSION, MAJOR 02/16/2007   Pulmonary emphysema ([place]) 02/16/2007   CARCINOMA, SQUAMOUS CELL 05/30/2001    ONSET DATE: since 2021  REFERRING DIAG: R29.898 (ICD-10-CM) - Weakness of both lower extremities G95.9 (ICD-10-CM) - Myelopathy ([place])   THERAPY DIAG:  Muscle weakness (generalized)  Other abnormalities of gait and mobility  Other symptoms and signs involving the musculoskeletal system  Unsteadiness on feet  Rationale for Evaluation and Treatment Rehabilitation  SUBJECTIVE:  SUBJECTIVE STATEMENT: Overall doing better and able to stand at kitchen sink. Having dizziness/lightheaded with certain movements and positions.   Pt accompanied by: self  PERTINENT HISTORY: History of present illness: Starting ~2021, he began having difficulty walking, stating that he could not get his legs to move.  He feels that there is a disconnect between his brain and legs.  He tried using a walker but was falling frequently and has been predominately wheelchair-bound for the past few months.   For the past 6 weeks, he started having fatigue in the arms.  No muscle spasms or twitches  Myelopathy - Ddx: radiation-induced myelopathy vs primary lateral sclerosis. Evaluated at [facility] who favored radiation-induced myelopathy given that he also has sensory complaints in the upper extremities, in addition to prominent UMN findings    PAIN:  Are you having pain? No, feeling of chronic low back pain, worse when standing and extending, e.g. reaching overhead  PRECAUTIONS: Fall  WEIGHT BEARING RESTRICTIONS No  FALLS: Has patient fallen in last 6 months? No  LIVING ENVIRONMENT: Lives with: lives with their  spouse Lives in: House/apartment Stairs: Yes: Internal: 12 steps; ramp to enter/exit home Has following equipment at home: Wheelchair (power), Wheelchair (manual), and shower chair  PLOF: Needs assistance with ADLs, Needs assistance with homemaking, and Needs assistance with transfers  PATIENT GOALS be able to stand long/safely enough to perform activities like washing dishes and reaching coffee cups  OBJECTIVE:  TODAY'S TREATMENT: 03/15/22 Activity Comments  Right Dix-Hallpike Right downbeating, no fatigue x 2 min  Left Dix-Hallpike Left upbeating  Right roll test  Right downbeating  Left roll test Upbeating left  Head Impulse Test  Positive left/right, comes with nystagmus  VOR x 1 Needs slow pace more dysfunction with right direction     TODAY'S TREATMENT: 03/09/22 Activity Comments  Standing at sink X 7 min  Vestibular assessment Saccades: WNL Smooth pursuits: WNL Convergence: WNL No spontaneous nystagmus Head Impulse test: +left VOR: greater dysfunction w/ vertical vs horizontal VOR cancellation: difficulty in tracking Horizontal roll test: negative Dix-Hallpike: left negative Dix-Hallpike: right positive.  Epley maneuver right              TODAY'S TREATMENT: 03/07/22 Activity Comments  Static standing X 5 min at countertop, intermittent UE support  Prone position X 5 min supported, prone elbows -hip extension 2+/5. Left hamstrings 3+/5, Right hamstrings 3-/5. Hip abductors 2/5  Supine strength/flexibility -butterfly bridge -SKTC -DKTC -static hamstring stretch -assisted piriformis stretch  Reports he has not attempted washing dishes                HOME EXERCISE PROGRAM: Access Code: [email or web address] Exercises - Cat Cow to Child's Pose  - 1 x daily - 7 x weekly - 10-20 reps - Prone Knee Flexion  - 1 x daily - 7 x weekly - 1-3 sets - 10 reps   DIAGNOSTIC FINDINGS:  IMPRESSION: Asymmetric activity within normal shaped striatum is favored within NORMAL  LIMITS. No focal loss of dopamine transport activity to suggest Parkinson's syndrome pathology.  IMPRESSION: 1. Normal thoracic cord. 2. No acute abnormality or significant degenerative changes of the thoracic spine.  IMPRESSION: No acute or reversible finding. 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 Old small vessel infarctions of the thalami, basal ganglia and hemispheric white matter. Tiny old left occipital infarction.  COGNITION: Overall cognitive status: Within functional limits for tasks assessed   SENSATION: WFL, grossly  COORDINATION: Alt toe taps impaired,  heel to shin limited by weakness and coordination defiicts  EDEMA:  none  MUSCLE TONE: unremarkable, very emaciated appearance, very little subcutaneous fat      MUSCLE LENGTH: WFL, tight to straight leg raise position with strong passive resistance at 35-50 degrees  DTRs:  Bilateral clonus, 2-4 beat  POSTURE: slumped, sacral sitting--not fixed, able to correct with cues, slight decrease in lumbar lordosis appreciated  LOWER EXTREMITY ROM:    WFL  (Blank rows = not tested)  LOWER EXTREMITY MMT:    MMT Right Eval Left Eval  Hip flexion 3 3  Hip extension    Hip abduction 2- 2-  Hip adduction 3 3  Hip internal rotation    Hip external rotation    Knee flexion 2+ 3  Knee extension 3+ 3+  Ankle dorsiflexion 2+ 2  Ankle plantarflexion 2+ 2+  Ankle inversion    Ankle eversion    (Blank rows = not tested)  Trunk flexion: 2+/5 UE: 5/5 resisted tests.  Able to perform bodyweight dip with RW (no LE contribution)  BED MOBILITY:  Modified indep for supine<>sit   TRANSFERS: Performs squat-pivot w/c to EOM with modified indep. Stand-pivot with RW and Supervision    CURB:  DNT due to safety  STAIRS:  Pt has been non-ambulatory since 2022  GAIT: Pt has been non-ambulatory since Aug 2022  FUNCTIONAL TESTs:  Static standing: Able to maintain unsupported standing 30-45 sec  increments   PATIENT EDUCATION: Education details: postural awareness and sitting with lumbar support and upright posture. Also in monitoring orthostatic hypotension Person educated: Patient and Spouse Education method: Explanation, Demonstration, and Tactile cues Education comprehension: verbalized understanding and needs further education      GOALS: Goals reviewed with patient? Yes  SHORT TERM GOALS: Target date: UPDATED/REVISED: 03/30/22  Patient will be independent in HEP to improve functional outcomes Baseline: Goal status: MET  2.  Patient will be able to tolerate (supported) prone position x 5 min to improve lumbar extension and progress for posterior chain strengthening Baseline: one-pillow support Goal status: MET  3.  Maintain supported standing x 5 min to improve participation in housekeeping and ADL participation Baseline: 1-3 min w/ BUE support on RW; 5+ min at counter with intermittent UE support, able to maintain unsupported standing 80% of the trial Goal status: MET  4. The patient will be independent with HEP for gaze adaptation, habituation, balance, and general mobility.  Baseline: newly initiated  Goals status: INITIAL  5. Maintain unsupported standing x 2 min to improve safety with housekeeping and ADL participation  Baseline: 30 sec  Goal status: INITIAL    LONG TERM GOALS: Target date: REVISED/UPDATED 04/21/22  Improve trunk flexion strength to 3/5, hip flexion strength 3+/5, and lumbar extension strength 3/5 to facilitate lumbar stability and reduce pain Baseline: 2+/5 flexion; 3/5 lumbar extension Goal status: On-going  2.  Demonstrate ability to stand x 5 min and perform unilateral reaching/overhead 1x15 reps left/right in order to improve participation and safety with washing dishes Baseline:  Goal status: MET  3. Demonstrate unsupported standing x 3 min to improve safety with housekeeping and ADL participation.  Baseline:  Goal status:  INITIAL  4. Report/demonstrate improved standing balance and tolerance to enable washing sink full of dishes.  Baseline: unable due to safety concerns  Goal status: INITIAL   ASSESSMENT:  CLINICAL IMPRESSION: Demonstrating direction changing nystagmus with Dix-Hallpike position and present when sitting up as well and is amplified when his glasses are removed. Pt  reports overall difficulty in gaze focus/stabilization and demo deficits in VOR requiring slow, deliberate speed and concentration. With Dix-Hallpike right down beating vs left upbeating? Initiated VOR gaze stabilization exercises   OBJECTIVE IMPAIRMENTS Abnormal gait, decreased activity tolerance, decreased balance, decreased coordination, decreased endurance, decreased knowledge of use of DME, decreased mobility, difficulty walking, decreased strength, dizziness, impaired perceived functional ability, impaired flexibility, improper body mechanics, and postural dysfunction.   ACTIVITY LIMITATIONS carrying, lifting, standing, transfers, bed mobility, reach over head, and locomotion level  PARTICIPATION LIMITATIONS: meal prep, cleaning, laundry, community activity, and yard work  PERSONAL FACTORS Age, Time since onset of injury/illness/exacerbation, and 1-2 comorbidities: neurological condition, hx of CA  are also affecting patient's functional outcome.   REHAB POTENTIAL: Good  CLINICAL DECISION MAKING: Evolving/moderate complexity  EVALUATION COMPLEXITY: Moderate  PLAN: PT FREQUENCY: 1-2x/week  PT DURATION: 6 weeks  PLANNED INTERVENTIONS: Therapeutic exercises, Therapeutic activity, Neuromuscular re-education, Balance training, Gait training, Patient/Family education, Self Care, Joint mobilization, Joint manipulation, Stair training, Vestibular training, Canalith repositioning, Orthotic/Fit training, DME instructions, Aquatic Therapy, Dry Needling, Electrical stimulation, Wheelchair mobility training, Spinal mobilization,  Cryotherapy, Moist heat, Splintting, Taping, Ultrasound, Biofeedback, and Manual therapy  PLAN FOR NEXT SESSION: Dynamic Visual Acuity, VOR x 1  8:51 AM, 03/15/22 M. Sherlyn Lees, PT, DPT Physical Therapist- [place] Office Number: [phone]

## 2022-03-17 ENCOUNTER — Ambulatory Visit: Payer: Medicare Other

## 2022-03-17 DIAGNOSIS — R2681 Unsteadiness on feet: Secondary | ICD-10-CM

## 2022-03-17 DIAGNOSIS — R42 Dizziness and giddiness: Secondary | ICD-10-CM

## 2022-03-17 DIAGNOSIS — R29898 Other symptoms and signs involving the musculoskeletal system: Secondary | ICD-10-CM

## 2022-03-17 DIAGNOSIS — M6281 Muscle weakness (generalized): Secondary | ICD-10-CM | POA: Diagnosis not present

## 2022-03-17 DIAGNOSIS — R2689 Other abnormalities of gait and mobility: Secondary | ICD-10-CM

## 2022-03-17 NOTE — Therapy (Signed)
OUTPATIENT PHYSICAL THERAPY NEURO TREATMENT    Patient Name: Lawrence Owen MRN: [id number] DOB:14-Dec-1955, 66 y.o., male Today's Date: 03/17/2022   PCP: Marda Stalker REFERRING PROVIDER: Alda Berthold, DO     PT End of Session - 03/17/22 0936     Visit Number 13    Number of Visits 24    Date for PT Re-Evaluation 04/20/22    Authorization Type Medicare/Medicaid    Progress Note Due on Visit 86    PT Start Time 0932    PT Stop Time 7858    PT Time Calculation (min) 43 min    Activity Tolerance Patient tolerated treatment well    Behavior During Therapy WFL for tasks assessed/performed             Past Medical History:  Diagnosis Date   Anxiety    Cancer of tongue ([place])    neck- lymp  nodes right; treated radiation   COPD (chronic obstructive pulmonary disease) ([place])    Hypertension    Hypothyroidism    OCD (obsessive compulsive disorder)    PTSD (post-traumatic stress disorder)    Stroke ([place])    "mini stoke"    Tingling of right upper extremity    Past Surgical History:  Procedure Laterality Date   NECK SURGERY  2003   lymph node removed   TONSILLECTOMY     TRANSCAROTID ARTERY REVASCULARIZATION  Right 02/21/2019   Procedure: TRANSCAROTID ARTERY REVASCULARIZATION;  Surgeon: Marty Heck, MD;  Location: [facility];  Service: Vascular;  Laterality: Right;   Patient Active Problem List   Diagnosis Date Noted   Carotid artery stenosis 02/21/2019   Carotid stenosis 01/15/2019   Stroke (cerebrum) ([employer]) 03/04/2018   Alcohol use 03/04/2018   Essential hypertension 03/04/2018   Bilateral carotid artery disease ([place]) 03/04/2018   Essential hypertension, benign 03/12/2010   ALLERGIC RHINITIS 03/12/2010   HEADACHE 03/12/2010   CONTUSION, HEAD 01/27/2009   HYPERCHOLESTEROLEMIA 10/23/2008   LOW BACK PAIN SYNDROME 10/23/2008   GROIN PAIN 10/23/2008   CAROTID BRUIT, LEFT 09/18/2008   PERIODONTAL DISEASE 01/03/2008   DENTAL CARIES 03/13/2007    Hypothyroidism 02/16/2007   DISORDER, PERSISTING AMNESTIC, DRUG-INDUCED 02/16/2007   DEPRESSION, MAJOR 02/16/2007   Pulmonary emphysema ([place]) 02/16/2007   CARCINOMA, SQUAMOUS CELL 05/30/2001    ONSET DATE: since 2021  REFERRING DIAG: R29.898 (ICD-10-CM) - Weakness of both lower extremities G95.9 (ICD-10-CM) - Myelopathy ([place])   THERAPY DIAG:  Muscle weakness (generalized)  Other abnormalities of gait and mobility  Other symptoms and signs involving the musculoskeletal system  Unsteadiness on feet  Dizziness and giddiness  Rationale for Evaluation and Treatment Rehabilitation  SUBJECTIVE:  SUBJECTIVE STATEMENT: Difficulty with keeping eye sight steady, especially when in the car.  Pt accompanied by: self  PERTINENT HISTORY: History of present illness: Starting ~2021, he began having difficulty walking, stating that he could not get his legs to move.  He feels that there is a disconnect between his brain and legs.  He tried using a walker but was falling frequently and has been predominately wheelchair-bound for the past few months.   For the past 6 weeks, he started having fatigue in the arms.  No muscle spasms or twitches  Myelopathy - Ddx: radiation-induced myelopathy vs primary lateral sclerosis. Evaluated at [facility] who favored radiation-induced myelopathy given that he also has sensory complaints in the upper extremities, in addition to prominent UMN findings    PAIN:  Are you having pain? No, feeling of chronic low back pain, worse when standing and extending, e.g. reaching overhead  PRECAUTIONS: Fall  WEIGHT BEARING RESTRICTIONS No  FALLS: Has patient fallen in last 6 months? No  LIVING ENVIRONMENT: Lives with: lives with their spouse Lives in:  House/apartment Stairs: Yes: Internal: 12 steps; ramp to enter/exit home Has following equipment at home: Wheelchair (power), Wheelchair (manual), and shower chair  PLOF: Needs assistance with ADLs, Needs assistance with homemaking, and Needs assistance with transfers  PATIENT GOALS be able to stand long/safely enough to perform activities like washing dishes and reaching coffee cups  OBJECTIVE:  TODAY'S TREATMENT: 03/17/22 Activity Comments  Dynamic Visual Acuity (seated) Static: line 8 Dynamic: line 4  Self Head Impulse Left upbeating, amplified with right head rotation  VOR x 1 seated Horizontal/vertical 4x30 sec, cues for slower pace and gaze fixation  Seated horizontal saccades Fairly good accuracy  Standing at counter -bolster between knees and cabinets for extension blocking: -wall wash 2x10 -military press 2x10 6# (DB on cabinet) unilat        TODAY'S TREATMENT: 03/15/22 Activity Comments  Right Dix-Hallpike Right downbeating, no fatigue x 2 min  Left Dix-Hallpike Left upbeating  Right roll test  Right downbeating  Left roll test Upbeating left  Head Impulse Test  Positive left/right, comes with nystagmus  VOR x 1 Needs slow pace more dysfunction with right direction            HOME EXERCISE PROGRAM: Access Code: [email or web address] Exercises - Cat Cow to Child's Pose  - 1 x daily - 7 x weekly - 10-20 reps - Prone Knee Flexion  - 1 x daily - 7 x weekly - 1-3 sets - 10 reps - Seated Gaze Stabilization with Head Rotation  - 1 x daily - 7 x weekly - 5-10 sets - 30 sec rounds hold - Seated Gaze Stabilization with Head Nod  - 1 x daily - 7 x weekly - 5-10 sets - 30 sec hold   DIAGNOSTIC FINDINGS:  IMPRESSION: Asymmetric activity within normal shaped striatum is favored within NORMAL LIMITS. No focal loss of dopamine transport activity to suggest Parkinson's syndrome pathology.  IMPRESSION: 1. Normal thoracic cord. 2. No acute abnormality or significant degenerative  changes of the thoracic spine.  IMPRESSION: No acute or reversible finding. 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 Old small vessel infarctions of the thalami, basal ganglia and hemispheric white matter. Tiny old left occipital infarction.  COGNITION: Overall cognitive status: Within functional limits for tasks assessed   SENSATION: WFL, grossly  COORDINATION: Alt toe taps impaired, heel to shin limited by weakness and coordination defiicts  EDEMA:  none  MUSCLE  TONE: unremarkable, very emaciated appearance, very little subcutaneous fat      MUSCLE LENGTH: WFL, tight to straight leg raise position with strong passive resistance at 35-50 degrees  DTRs:  Bilateral clonus, 2-4 beat  POSTURE: slumped, sacral sitting--not fixed, able to correct with cues, slight decrease in lumbar lordosis appreciated  LOWER EXTREMITY ROM:    WFL  (Blank rows = not tested)  LOWER EXTREMITY MMT:    MMT Right Eval Left Eval  Hip flexion 3 3  Hip extension    Hip abduction 2- 2-  Hip adduction 3 3  Hip internal rotation    Hip external rotation    Knee flexion 2+ 3  Knee extension 3+ 3+  Ankle dorsiflexion 2+ 2  Ankle plantarflexion 2+ 2+  Ankle inversion    Ankle eversion    (Blank rows = not tested)  Trunk flexion: 2+/5 UE: 5/5 resisted tests.  Able to perform bodyweight dip with RW (no LE contribution)  BED MOBILITY:  Modified indep for supine<>sit   TRANSFERS: Performs squat-pivot w/c to EOM with modified indep. Stand-pivot with RW and Supervision    CURB:  DNT due to safety  STAIRS:  Pt has been non-ambulatory since 2022  GAIT: Pt has been non-ambulatory since Aug 2022  FUNCTIONAL TESTs:  Static standing: Able to maintain unsupported standing 30-45 sec increments   PATIENT EDUCATION: Education details: postural awareness and sitting with lumbar support and upright posture. Also in monitoring orthostatic hypotension Person educated:  Patient and Spouse Education method: Explanation, Demonstration, and Tactile cues Education comprehension: verbalized understanding and needs further education      GOALS: Goals reviewed with patient? Yes  SHORT TERM GOALS: Target date: UPDATED/REVISED: 03/30/22  Patient will be independent in HEP to improve functional outcomes Baseline: Goal status: MET  2.  Patient will be able to tolerate (supported) prone position x 5 min to improve lumbar extension and progress for posterior chain strengthening Baseline: one-pillow support Goal status: MET  3.  Maintain supported standing x 5 min to improve participation in housekeeping and ADL participation Baseline: 1-3 min w/ BUE support on RW; 5+ min at counter with intermittent UE support, able to maintain unsupported standing 80% of the trial Goal status: MET  4. The patient will be independent with HEP for gaze adaptation, habituation, balance, and general mobility.  Baseline: newly initiated  Goals status: INITIAL  5. Maintain unsupported standing x 2 min to improve safety with housekeeping and ADL participation  Baseline: 30 sec  Goal status: INITIAL    LONG TERM GOALS: Target date: REVISED/UPDATED 04/21/22  Improve trunk flexion strength to 3/5, hip flexion strength 3+/5, and lumbar extension strength 3/5 to facilitate lumbar stability and reduce pain Baseline: 2+/5 flexion; 3/5 lumbar extension Goal status: On-going  2.  Demonstrate ability to stand x 5 min and perform unilateral reaching/overhead 1x15 reps left/right in order to improve participation and safety with washing dishes Baseline:  Goal status: MET  3. Demonstrate unsupported standing x 3 min to improve safety with housekeeping and ADL participation.  Baseline:  Goal status: INITIAL  4. Report/demonstrate improved standing balance and tolerance to enable washing sink full of dishes.  Baseline: unable due to safety concerns  Goal status:  INITIAL   ASSESSMENT:  CLINICAL IMPRESSION: Spontaneous nystagmus with seeming to be left upbeating and demonstrates significant gaze instability as evident by Dynamic Visual Acuity being a 4-line difference. Initiated activities for gaze stabilization to see if this will help ameliorate these issues. Continued  with activities to improve strength and stability for unsupported standing to facilitate ADL/housekeeping performance.    OBJECTIVE IMPAIRMENTS Abnormal gait, decreased activity tolerance, decreased balance, decreased coordination, decreased endurance, decreased knowledge of use of DME, decreased mobility, difficulty walking, decreased strength, dizziness, impaired perceived functional ability, impaired flexibility, improper body mechanics, and postural dysfunction.   ACTIVITY LIMITATIONS carrying, lifting, standing, transfers, bed mobility, reach over head, and locomotion level  PARTICIPATION LIMITATIONS: meal prep, cleaning, laundry, community activity, and yard work  PERSONAL FACTORS Age, Time since onset of injury/illness/exacerbation, and 1-2 comorbidities: neurological condition, hx of CA  are also affecting patient's functional outcome.   REHAB POTENTIAL: Good  CLINICAL DECISION MAKING: Evolving/moderate complexity  EVALUATION COMPLEXITY: Moderate  PLAN: PT FREQUENCY: 1-2x/week  PT DURATION: 6 weeks  PLANNED INTERVENTIONS: Therapeutic exercises, Therapeutic activity, Neuromuscular re-education, Balance training, Gait training, Patient/Family education, Self Care, Joint mobilization, Joint manipulation, Stair training, Vestibular training, Canalith repositioning, Orthotic/Fit training, DME instructions, Aquatic Therapy, Dry Needling, Electrical stimulation, Wheelchair mobility training, Spinal mobilization, Cryotherapy, Moist heat, Splintting, Taping, Ultrasound, Biofeedback, and Manual therapy  PLAN FOR NEXT SESSION:  VOR x 1  9:36 AM, 03/17/22 M. Sherlyn Lees, PT,  DPT Physical Therapist- [place] Office Number: [phone]

## 2022-03-22 ENCOUNTER — Ambulatory Visit: Payer: Medicare Other

## 2022-03-25 ENCOUNTER — Ambulatory Visit: Payer: Medicare Other

## 2022-03-25 DIAGNOSIS — R2689 Other abnormalities of gait and mobility: Secondary | ICD-10-CM

## 2022-03-25 DIAGNOSIS — M6281 Muscle weakness (generalized): Secondary | ICD-10-CM

## 2022-03-25 DIAGNOSIS — R42 Dizziness and giddiness: Secondary | ICD-10-CM

## 2022-03-25 DIAGNOSIS — R29898 Other symptoms and signs involving the musculoskeletal system: Secondary | ICD-10-CM

## 2022-03-25 DIAGNOSIS — R2681 Unsteadiness on feet: Secondary | ICD-10-CM

## 2022-03-25 NOTE — Therapy (Signed)
OUTPATIENT PHYSICAL THERAPY NEURO TREATMENT    Patient Name: Lawrence Owen MRN: [id number] DOB:1955/07/08, 65 y.o., male Today's Date: 03/25/2022   PCP: Marda Stalker REFERRING PROVIDER: Alda Berthold, DO     PT End of Session - 03/25/22 0939     Visit Number 14    Number of Visits 24    Date for PT Re-Evaluation 04/20/22    Authorization Type Medicare/Medicaid    Progress Note Due on Visit 11    PT Start Time 0930    PT Stop Time 5449    PT Time Calculation (min) 45 min    Activity Tolerance Patient tolerated treatment well    Behavior During Therapy WFL for tasks assessed/performed             Past Medical History:  Diagnosis Date   Anxiety    Cancer of tongue ([place])    neck- lymp  nodes right; treated radiation   COPD (chronic obstructive pulmonary disease) ([facility])    Hypertension    Hypothyroidism    OCD (obsessive compulsive disorder)    PTSD (post-traumatic stress disorder)    Stroke ([place])    "mini stoke"    Tingling of right upper extremity    Past Surgical History:  Procedure Laterality Date   NECK SURGERY  2003   lymph node removed   TONSILLECTOMY     TRANSCAROTID ARTERY REVASCULARIZATION  Right 02/21/2019   Procedure: TRANSCAROTID ARTERY REVASCULARIZATION;  Surgeon: Marty Heck, MD;  Location: [facility];  Service: Vascular;  Laterality: Right;   Patient Active Problem List   Diagnosis Date Noted   Carotid artery stenosis 02/21/2019   Carotid stenosis 01/15/2019   Stroke (cerebrum) ([place]) 03/04/2018   Alcohol use 03/04/2018   Essential hypertension 03/04/2018   Bilateral carotid artery disease ([place]) 03/04/2018   Essential hypertension, benign 03/12/2010   ALLERGIC RHINITIS 03/12/2010   HEADACHE 03/12/2010   CONTUSION, HEAD 01/27/2009   HYPERCHOLESTEROLEMIA 10/23/2008   LOW BACK PAIN SYNDROME 10/23/2008   GROIN PAIN 10/23/2008   CAROTID BRUIT, LEFT 09/18/2008   PERIODONTAL DISEASE 01/03/2008   DENTAL CARIES 03/13/2007    Hypothyroidism 02/16/2007   DISORDER, PERSISTING AMNESTIC, DRUG-INDUCED 02/16/2007   DEPRESSION, MAJOR 02/16/2007   Pulmonary emphysema ([place]) 02/16/2007   CARCINOMA, SQUAMOUS CELL 05/30/2001    ONSET DATE: since 2021  REFERRING DIAG: R29.898 (ICD-10-CM) - Weakness of both lower extremities G95.9 (ICD-10-CM) - Myelopathy ([facility])   THERAPY DIAG:  Muscle weakness (generalized)  Other abnormalities of gait and mobility  Other symptoms and signs involving the musculoskeletal system  Unsteadiness on feet  Dizziness and giddiness  Rationale for Evaluation and Treatment Rehabilitation  SUBJECTIVE:  SUBJECTIVE STATEMENT: Nothing new. Received lumbar injection to address back pain but it doesn't seem to have had much effect  Pt accompanied by: self  PERTINENT HISTORY: History of present illness: Starting ~2021, he began having difficulty walking, stating that he could not get his legs to move.  He feels that there is a disconnect between his brain and legs.  He tried using a walker but was falling frequently and has been predominately wheelchair-bound for the past few months.   For the past 6 weeks, he started having fatigue in the arms.  No muscle spasms or twitches  Myelopathy - Ddx: radiation-induced myelopathy vs primary lateral sclerosis. Evaluated at [facility] who favored radiation-induced myelopathy given that he also has sensory complaints in the upper extremities, in addition to prominent UMN findings    PAIN:  Are you having pain? No, feeling of chronic low back pain, worse when standing and extending, e.g. reaching overhead  PRECAUTIONS: Fall  WEIGHT BEARING RESTRICTIONS No  FALLS: Has patient fallen in last 6 months? No  LIVING ENVIRONMENT: Lives with: lives with their  spouse Lives in: House/apartment Stairs: Yes: Internal: 12 steps; ramp to enter/exit home Has following equipment at home: Wheelchair (power), Wheelchair (manual), and shower chair  PLOF: Needs assistance with ADLs, Needs assistance with homemaking, and Needs assistance with transfers  PATIENT GOALS be able to stand long/safely enough to perform activities like washing dishes and reaching coffee cups  OBJECTIVE:  TODAY'S TREATMENT: 03/25/22 Activity Comments  NU-step x 10 min Assist for RLE abduction to improve alternating coordination and facilitate LE extension strength for transfers/weight shifting  Seated VOR x 1 seated Horizontal/vertical  Self-Head Impulse on target With right head rotation, left upbeating nystagmus is more prominent--gaze invoked nystagmus  Parallel bar activities -walking: for weight shifting and improve safety with maneuvering for kitchen counter activities -standing with foot on step: therapist blocking stance knee for extension to improve single limb support for balance during transfers -very wide BOS static position to promote stretch for hip adductors and provide for lumbar extension for increased comfort in standing for ADL participation          TODAY'S TREATMENT: 03/17/22 Activity Comments  Dynamic Visual Acuity (seated) Static: line 8 Dynamic: line 4  Self Head Impulse Left upbeating, amplified with right head rotation  VOR x 1 seated Horizontal/vertical 4x30 sec, cues for slower pace and gaze fixation  Seated horizontal saccades Fairly good accuracy  Standing at counter -bolster between knees and cabinets for extension blocking: -wall wash 2x10 -military press 2x10 6# (DB on cabinet) unilat        TODAY'S TREATMENT: 03/15/22 Activity Comments  Right Dix-Hallpike Right downbeating, no fatigue x 2 min  Left Dix-Hallpike Left upbeating  Right roll test  Right downbeating  Left roll test Upbeating left  Head Impulse Test  Positive left/right,  comes with nystagmus  VOR x 1 Needs slow pace more dysfunction with right direction            HOME EXERCISE PROGRAM: Access Code: [email or web address] Exercises - Cat Cow to Child's Pose  - 1 x daily - 7 x weekly - 10-20 reps - Prone Knee Flexion  - 1 x daily - 7 x weekly - 1-3 sets - 10 reps - Seated Gaze Stabilization with Head Rotation  - 1 x daily - 7 x weekly - 5-10 sets - 30 sec rounds hold - Seated Gaze Stabilization with Head Nod  - 1 x daily - 7  x weekly - 5-10 sets - 30 sec hold   DIAGNOSTIC FINDINGS:  IMPRESSION: Asymmetric activity within normal shaped striatum is favored within NORMAL LIMITS. No focal loss of dopamine transport activity to suggest Parkinson's syndrome pathology.  IMPRESSION: 1. Normal thoracic cord. 2. No acute abnormality or significant degenerative changes of the thoracic spine.  IMPRESSION: No acute or reversible finding. 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 Old small vessel infarctions of the thalami, basal ganglia and hemispheric white matter. Tiny old left occipital infarction.  COGNITION: Overall cognitive status: Within functional limits for tasks assessed   SENSATION: WFL, grossly  COORDINATION: Alt toe taps impaired, heel to shin limited by weakness and coordination defiicts  EDEMA:  none  MUSCLE TONE: unremarkable, very emaciated appearance, very little subcutaneous fat      MUSCLE LENGTH: WFL, tight to straight leg raise position with strong passive resistance at 35-50 degrees  DTRs:  Bilateral clonus, 2-4 beat  POSTURE: slumped, sacral sitting--not fixed, able to correct with cues, slight decrease in lumbar lordosis appreciated  LOWER EXTREMITY ROM:    WFL  (Blank rows = not tested)  LOWER EXTREMITY MMT:    MMT Right Eval Left Eval  Hip flexion 3 3  Hip extension    Hip abduction 2- 2-  Hip adduction 3 3  Hip internal rotation    Hip external rotation    Knee flexion 2+ 3  Knee extension 3+  3+  Ankle dorsiflexion 2+ 2  Ankle plantarflexion 2+ 2+  Ankle inversion    Ankle eversion    (Blank rows = not tested)  Trunk flexion: 2+/5 UE: 5/5 resisted tests.  Able to perform bodyweight dip with RW (no LE contribution)  BED MOBILITY:  Modified indep for supine<>sit   TRANSFERS: Performs squat-pivot w/c to EOM with modified indep. Stand-pivot with RW and Supervision    CURB:  DNT due to safety  STAIRS:  Pt has been non-ambulatory since 2022  GAIT: Pt has been non-ambulatory since Aug 2022  FUNCTIONAL TESTs:  Static standing: Able to maintain unsupported standing 30-45 sec increments   PATIENT EDUCATION: Education details: postural awareness and sitting with lumbar support and upright posture. Also in monitoring orthostatic hypotension Person educated: Patient and Spouse Education method: Explanation, Demonstration, and Tactile cues Education comprehension: verbalized understanding and needs further education      GOALS: Goals reviewed with patient? Yes  SHORT TERM GOALS: Target date: UPDATED/REVISED: 03/30/22  Patient will be independent in HEP to improve functional outcomes Baseline: Goal status: MET  2.  Patient will be able to tolerate (supported) prone position x 5 min to improve lumbar extension and progress for posterior chain strengthening Baseline: one-pillow support Goal status: MET  3.  Maintain supported standing x 5 min to improve participation in housekeeping and ADL participation Baseline: 1-3 min w/ BUE support on RW; 5+ min at counter with intermittent UE support, able to maintain unsupported standing 80% of the trial Goal status: MET  4. The patient will be independent with HEP for gaze adaptation, habituation, balance, and general mobility.  Baseline: newly initiated  Goals status: INITIAL  5. Maintain unsupported standing x 2 min to improve safety with housekeeping and ADL participation  Baseline: 30 sec  Goal status:  INITIAL    LONG TERM GOALS: Target date: REVISED/UPDATED 04/21/22  Improve trunk flexion strength to 3/5, hip flexion strength 3+/5, and lumbar extension strength 3/5 to facilitate lumbar stability and reduce pain Baseline: 2+/5 flexion; 3/5 lumbar extension  Goal status: On-going  2.  Demonstrate ability to stand x 5 min and perform unilateral reaching/overhead 1x15 reps left/right in order to improve participation and safety with washing dishes Baseline:  Goal status: MET  3. Demonstrate unsupported standing x 3 min to improve safety with housekeeping and ADL participation.  Baseline:  Goal status: INITIAL  4. Report/demonstrate improved standing balance and tolerance to enable washing sink full of dishes.  Baseline: unable due to safety concerns  Goal status: INITIAL   ASSESSMENT:  CLINICAL IMPRESSION: Continues to exhibit gazed-evoked nystagmus more pronounced with right rotation, also demonstrates spasticity of hip flexors/adductors more than quad/hamstring and also with presence of ankle clonus. Continued focus on standing stability activities to improve standing tolerance and safety for ADL participation and incorporating vestibular-ocular activities to hopefully improved gaze stability and postural stability.  Continued with gaze stabilization activities to improve target fixation. Continued sessions to progress POC details   OBJECTIVE IMPAIRMENTS Abnormal gait, decreased activity tolerance, decreased balance, decreased coordination, decreased endurance, decreased knowledge of use of DME, decreased mobility, difficulty walking, decreased strength, dizziness, impaired perceived functional ability, impaired flexibility, improper body mechanics, and postural dysfunction.   ACTIVITY LIMITATIONS carrying, lifting, standing, transfers, bed mobility, reach over head, and locomotion level  PARTICIPATION LIMITATIONS: meal prep, cleaning, laundry, community activity, and yard  work  PERSONAL FACTORS Age, Time since onset of injury/illness/exacerbation, and 1-2 comorbidities: neurological condition, hx of CA  are also affecting patient's functional outcome.   REHAB POTENTIAL: Good  CLINICAL DECISION MAKING: Evolving/moderate complexity  EVALUATION COMPLEXITY: Moderate  PLAN: PT FREQUENCY: 1-2x/week  PT DURATION: 6 weeks  PLANNED INTERVENTIONS: Therapeutic exercises, Therapeutic activity, Neuromuscular re-education, Balance training, Gait training, Patient/Family education, Self Care, Joint mobilization, Joint manipulation, Stair training, Vestibular training, Canalith repositioning, Orthotic/Fit training, DME instructions, Aquatic Therapy, Dry Needling, Electrical stimulation, Wheelchair mobility training, Spinal mobilization, Cryotherapy, Moist heat, Splintting, Taping, Ultrasound, Biofeedback, and Manual therapy  PLAN FOR NEXT SESSION:  re--tests positions?  9:39 AM, 03/25/22 M. Sherlyn Lees, PT, DPT Physical Therapist- [place] Office Number: [phone]

## 2022-03-29 ENCOUNTER — Ambulatory Visit: Payer: Medicare Other | Admitting: Rehabilitative and Restorative Service Providers"

## 2022-03-29 ENCOUNTER — Encounter: Payer: Self-pay | Admitting: Rehabilitative and Restorative Service Providers"

## 2022-03-29 DIAGNOSIS — M6281 Muscle weakness (generalized): Secondary | ICD-10-CM

## 2022-03-29 DIAGNOSIS — R42 Dizziness and giddiness: Secondary | ICD-10-CM

## 2022-03-29 DIAGNOSIS — R2689 Other abnormalities of gait and mobility: Secondary | ICD-10-CM

## 2022-03-29 DIAGNOSIS — R2681 Unsteadiness on feet: Secondary | ICD-10-CM

## 2022-03-29 DIAGNOSIS — R29898 Other symptoms and signs involving the musculoskeletal system: Secondary | ICD-10-CM

## 2022-03-29 NOTE — Therapy (Signed)
OUTPATIENT PHYSICAL THERAPY NEURO TREATMENT    Patient Name: Lawrence Owen MRN: [id number] DOB:Oct 01, 1955, 66 y.o., male Today's Date: 03/29/2022   PCP: Marda Stalker REFERRING PROVIDER: Alda Berthold, DO     PT End of Session - 03/29/22 0933     Visit Number 15    Number of Visits 24    Date for PT Re-Evaluation 04/20/22    Authorization Type Medicare/Medicaid    Progress Note Due on Visit 12    PT Start Time 0934    PT Stop Time 1015    PT Time Calculation (min) 41 min    Activity Tolerance Patient tolerated treatment well    Behavior During Therapy WFL for tasks assessed/performed              Past Medical History:  Diagnosis Date   Anxiety    Cancer of tongue ([place])    neck- lymp  nodes right; treated radiation   COPD (chronic obstructive pulmonary disease) ([place])    Hypertension    Hypothyroidism    OCD (obsessive compulsive disorder)    PTSD (post-traumatic stress disorder)    Stroke ([place])    "mini stoke"    Tingling of right upper extremity    Past Surgical History:  Procedure Laterality Date   NECK SURGERY  2003   lymph node removed   TONSILLECTOMY     TRANSCAROTID ARTERY REVASCULARIZATION  Right 02/21/2019   Procedure: TRANSCAROTID ARTERY REVASCULARIZATION;  Surgeon: Marty Heck, MD;  Location: [facility];  Service: Vascular;  Laterality: Right;   Patient Active Problem List   Diagnosis Date Noted   Carotid artery stenosis 02/21/2019   Carotid stenosis 01/15/2019   Stroke (cerebrum) ([place]) 03/04/2018   Alcohol use 03/04/2018   Essential hypertension 03/04/2018   Bilateral carotid artery disease ([place]) 03/04/2018   Essential hypertension, benign 03/12/2010   ALLERGIC RHINITIS 03/12/2010   HEADACHE 03/12/2010   CONTUSION, HEAD 01/27/2009   HYPERCHOLESTEROLEMIA 10/23/2008   LOW BACK PAIN SYNDROME 10/23/2008   GROIN PAIN 10/23/2008   CAROTID BRUIT, LEFT 09/18/2008   PERIODONTAL DISEASE 01/03/2008   DENTAL CARIES 03/13/2007    Hypothyroidism 02/16/2007   DISORDER, PERSISTING AMNESTIC, DRUG-INDUCED 02/16/2007   DEPRESSION, MAJOR 02/16/2007   Pulmonary emphysema ([place]) 02/16/2007   CARCINOMA, SQUAMOUS CELL 05/30/2001    ONSET DATE: since 2021  REFERRING DIAG: R29.898 (ICD-10-CM) - Weakness of both lower extremities G95.9 (ICD-10-CM) - Myelopathy ([place])   THERAPY DIAG:  Muscle weakness (generalized)  Other abnormalities of gait and mobility  Other symptoms and signs involving the musculoskeletal system  Unsteadiness on feet  Dizziness and giddiness  Rationale for Evaluation and Treatment Rehabilitation  SUBJECTIVE:  SUBJECTIVE STATEMENT: He reports he got the lumbar injection, which helped the pain.  He notes no changes in leg strength with injection.  Home exercises are going well.  He is drinking water during subjective intake and notes difficulty at times with swallowing water.  The dizziness is day to day and sometimes "my eyes get weird." Pt accompanied by: self  PERTINENT HISTORY: History of present illness: Starting ~2021, he began having difficulty walking, stating that he could not get his legs to move.  He feels that there is a disconnect between his brain and legs.  He tried using a walker but was falling frequently and has been predominately wheelchair-bound for the past few months.   For the past 6 weeks, he started having fatigue in the arms.  No muscle spasms or twitches  Myelopathy - Ddx: radiation-induced myelopathy vs primary lateral sclerosis. Evaluated at [facility] who favored radiation-induced myelopathy given that he also has sensory complaints in the upper extremities, in addition to prominent UMN findings   PAIN:  Are you having pain? No, feeling of chronic low back pain, worse when  standing and extending, e.g. reaching overhead  PRECAUTIONS: Fall  WEIGHT BEARING RESTRICTIONS No  FALLS: Has patient fallen in last 6 months? No  PATIENT GOALS be able to stand long/safely enough to perform activities like washing dishes and reaching coffee cups  OBJECTIVE:  [facility] Adult PT Treatment:                                                DATE: 03/29/22 Therapeutic Exercise: Quadriped rocking anterior/posterior cat/cow scapular protraction/retraction head turns for habituation Prone Knee flexion with assist x 5 reps Supine Hooklying marching x 10 reps Bridging x 10 reps Attempted rolling physioball to hips for bilat knee to chest, unable Neuromuscular re-ed: Habituation in standing frame with horizontal head turns, vertical head turns.  Used visual cues for spotting objects to lessen symptoms of retinal slip Therapeutic Activity: Standing frame x 5 minutes while adding habituation activities   TODAY'S TREATMENT: 03/25/22 Activity Comments  NU-step x 10 min Assist for RLE abduction to improve alternating coordination and facilitate LE extension strength for transfers/weight shifting  Seated VOR x 1 seated Horizontal/vertical  Self-Head Impulse on target With right head rotation, left upbeating nystagmus is more prominent--gaze invoked nystagmus  Parallel bar activities -walking: for weight shifting and improve safety with maneuvering for kitchen counter activities -standing with foot on step: therapist blocking stance knee for extension to improve single limb support for balance during transfers -very wide BOS static position to promote stretch for hip adductors and provide for lumbar extension for increased comfort in standing for ADL participation          TODAY'S TREATMENT: 03/17/22 Activity Comments  Dynamic Visual Acuity (seated) Static: line 8 Dynamic: line 4  Self Head Impulse Left upbeating, amplified with right head rotation  VOR x 1 seated  Horizontal/vertical 4x30 sec, cues for slower pace and gaze fixation  Seated horizontal saccades Fairly good accuracy  Standing at counter -bolster between knees and cabinets for extension blocking: -wall wash 2x10 -military [employer] 2x10 6# (DB on cabinet) unilat         PATIENT EDUCATION: Education details: HEP Person educated: Patient  Education method: Explanation, Demonstration, and Tactile cues, handout Education comprehension: verbalized understanding and needs further education  HOME EXERCISE PROGRAM: Access Code: [email or web address] URL: [email or web address] Date: 03/29/2022 Prepared by: Rudell Cobb  Exercises - Cat Cow to Child's Pose  - 1 x daily - 7 x weekly - 10-20 reps - Prone Knee Flexion  - 1 x daily - 7 x weekly - 1-3 sets - 10 reps - Seated Gaze Stabilization with Head Rotation  - 1 x daily - 7 x weekly - 5-10 sets - 30 sec rounds hold - Seated Gaze Stabilization with Head Nod  - 1 x daily - 7 x weekly - 5-10 sets - 30 sec hold - Seated Right Head Turns Vestibular Habituation  - 1 x daily - 7 x weekly - 1 sets - 10 reps - Seated Head Nods Vestibular Habituation  - 1 x daily - 7 x weekly - 1 sets - 10 reps  __________________________________________________________________________________________________ From initial eval DIAGNOSTIC FINDINGS:  IMPRESSION: Asymmetric activity within normal shaped striatum is favored within NORMAL LIMITS. No focal loss of dopamine transport activity to suggest Parkinson's syndrome pathology.  IMPRESSION: 1. Normal thoracic cord. 2. No acute abnormality or significant degenerative changes of the thoracic spine.  IMPRESSION: No acute or reversible finding. 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 Old small vessel infarctions of the thalami, basal ganglia and hemispheric white matter. Tiny old left occipital infarction.  MUSCLE LENGTH: WFL, tight to straight leg raise position with strong passive  resistance at 35-50 degrees  DTRs:  Bilateral clonus, 2-4 beat  POSTURE: slumped, sacral sitting--not fixed, able to correct with cues, slight decrease in lumbar lordosis appreciated  LOWER EXTREMITY ROM:    WFL  (Blank rows = not tested)  LOWER EXTREMITY MMT:    MMT Right Eval Left Eval  Hip flexion 3 3  Hip extension    Hip abduction 2- 2-  Hip adduction 3 3  Hip internal rotation    Hip external rotation    Knee flexion 2+ 3  Knee extension 3+ 3+  Ankle dorsiflexion 2+ 2  Ankle plantarflexion 2+ 2+  Ankle inversion    Ankle eversion    (Blank rows = not tested)  Trunk flexion: 2+/5 UE: 5/5 resisted tests.  Able to perform bodyweight dip with RW (no LE contribution)  BED MOBILITY:  Modified indep for supine<>sit  TRANSFERS: Performs squat-pivot w/c to EOM with modified indep. Stand-pivot with RW and Supervision  FUNCTIONAL TESTs:  Static standing: Able to maintain unsupported standing 30-45 sec increments  __________________________________________________________________________________________________________________  GOALS: Goals reviewed with patient? Yes  SHORT TERM GOALS: Target date: UPDATED/REVISED: 03/30/22  Patient will be independent in HEP to improve functional outcomes Baseline: Goal status: MET  2.  Patient will be able to tolerate (supported) prone position x 5 min to improve lumbar extension and progress for posterior chain strengthening Baseline: one-pillow support Goal status: MET  3.  Maintain supported standing x 5 min to improve participation in housekeeping and ADL participation Baseline: 1-3 min w/ BUE support on RW; 5+ min at counter with intermittent UE support, able to maintain unsupported standing 80% of the trial Goal status: MET  4. The patient will be independent with HEP for gaze adaptation, habituation, balance, and general mobility.  Baseline: newly initiated  Goals status: IN PROGRESS 5. Maintain unsupported standing  x 2 min to improve safety with housekeeping and ADL participation  Baseline: 30 sec  Goal status: IN PROGRESS   LONG TERM GOALS: Target date: REVISED/UPDATED 04/21/22  Improve trunk flexion strength to 3/5, hip flexion strength 3+/5, and lumbar  extension strength 3/5 to facilitate lumbar stability and reduce pain Baseline: 2+/5 flexion; 3/5 lumbar extension Goal status: On-going  2.  Demonstrate ability to stand x 5 min and perform unilateral reaching/overhead 1x15 reps left/right in order to improve participation and safety with washing dishes Baseline:  Goal status: MET  3. Demonstrate unsupported standing x 3 min to improve safety with housekeeping and ADL participation.  Baseline:  Goal status: IN PROGRESS  4. Report/demonstrate improved standing balance and tolerance to enable washing sink full of dishes.  Baseline: unable due to safety concerns  Goal status: IN PROGRESS   ASSESSMENT:  CLINICAL IMPRESSION: PT continues to progress LE strengthening, postural stretching, loading through LEs and incorporated habituation activities into today's session.  We are continuing to focus on functional activities and tolerance to head motion and spotting during ADLs.  Continued sessions to progress POC details   OBJECTIVE IMPAIRMENTS Abnormal gait, decreased activity tolerance, decreased balance, decreased coordination, decreased endurance, decreased knowledge of use of DME, decreased mobility, difficulty walking, decreased strength, dizziness, impaired perceived functional ability, impaired flexibility, improper body mechanics, and postural dysfunction.   ACTIVITY LIMITATIONS carrying, lifting, standing, transfers, bed mobility, reach over head, and locomotion level  PARTICIPATION LIMITATIONS: meal prep, cleaning, laundry, community activity, and yard work  PERSONAL FACTORS Age, Time since onset of injury/illness/exacerbation, and 1-2 comorbidities: neurological condition, hx of CA  are  also affecting patient's functional outcome.   REHAB POTENTIAL: Good  CLINICAL DECISION MAKING: Evolving/moderate complexity  EVALUATION COMPLEXITY: Moderate  PLAN: PT FREQUENCY: 1-2x/week  PT DURATION: 6 weeks  PLANNED INTERVENTIONS: Therapeutic exercises, Therapeutic activity, Neuromuscular re-education, Balance training, Gait training, Patient/Family education, Self Care, Joint mobilization, Joint manipulation, Stair training, Vestibular training, Canalith repositioning, Orthotic/Fit training, DME instructions, Aquatic Therapy, Dry Needling, Electrical stimulation, Wheelchair mobility training, Spinal mobilization, Cryotherapy, Moist heat, Splintting, Taping, Ultrasound, Biofeedback, and Manual therapy  PLAN FOR NEXT SESSION:  Functional training with head motion, sit<>stand, and loading to tolerance; re--tests positions?  9:34 AM, 03/29/22 Lindaann Gradilla, PT

## 2022-04-01 ENCOUNTER — Ambulatory Visit: Payer: Medicare Other | Attending: Neurology

## 2022-04-01 DIAGNOSIS — R2681 Unsteadiness on feet: Secondary | ICD-10-CM | POA: Insufficient documentation

## 2022-04-01 DIAGNOSIS — R42 Dizziness and giddiness: Secondary | ICD-10-CM | POA: Insufficient documentation

## 2022-04-01 DIAGNOSIS — M6281 Muscle weakness (generalized): Secondary | ICD-10-CM | POA: Diagnosis not present

## 2022-04-01 DIAGNOSIS — R29898 Other symptoms and signs involving the musculoskeletal system: Secondary | ICD-10-CM | POA: Insufficient documentation

## 2022-04-01 DIAGNOSIS — R2689 Other abnormalities of gait and mobility: Secondary | ICD-10-CM | POA: Diagnosis present

## 2022-04-01 NOTE — Therapy (Signed)
OUTPATIENT PHYSICAL THERAPY NEURO TREATMENT    Patient Name: Lawrence Owen MRN: [id number] DOB:Oct 01, 1955, 66 y.o., male Today's Date: 03/29/2022   PCP: Marda Stalker REFERRING PROVIDER: Alda Berthold, DO     PT End of Session - 03/29/22 0933     Visit Number 15    Number of Visits 24    Date for PT Re-Evaluation 04/20/22    Authorization Type Medicare/Medicaid    Progress Note Due on Visit 12    PT Start Time 0934    PT Stop Time 1015    PT Time Calculation (min) 41 min    Activity Tolerance Patient tolerated treatment well    Behavior During Therapy WFL for tasks assessed/performed              Past Medical History:  Diagnosis Date   Anxiety    Cancer of tongue ([place])    neck- lymp  nodes right; treated radiation   COPD (chronic obstructive pulmonary disease) ([place])    Hypertension    Hypothyroidism    OCD (obsessive compulsive disorder)    PTSD (post-traumatic stress disorder)    Stroke ([place])    "mini stoke"    Tingling of right upper extremity    Past Surgical History:  Procedure Laterality Date   NECK SURGERY  2003   lymph node removed   TONSILLECTOMY     TRANSCAROTID ARTERY REVASCULARIZATION  Right 02/21/2019   Procedure: TRANSCAROTID ARTERY REVASCULARIZATION;  Surgeon: Marty Heck, MD;  Location: [facility];  Service: Vascular;  Laterality: Right;   Patient Active Problem List   Diagnosis Date Noted   Carotid artery stenosis 02/21/2019   Carotid stenosis 01/15/2019   Stroke (cerebrum) ([place]) 03/04/2018   Alcohol use 03/04/2018   Essential hypertension 03/04/2018   Bilateral carotid artery disease ([place]) 03/04/2018   Essential hypertension, benign 03/12/2010   ALLERGIC RHINITIS 03/12/2010   HEADACHE 03/12/2010   CONTUSION, HEAD 01/27/2009   HYPERCHOLESTEROLEMIA 10/23/2008   LOW BACK PAIN SYNDROME 10/23/2008   GROIN PAIN 10/23/2008   CAROTID BRUIT, LEFT 09/18/2008   PERIODONTAL DISEASE 01/03/2008   DENTAL CARIES 03/13/2007    Hypothyroidism 02/16/2007   DISORDER, PERSISTING AMNESTIC, DRUG-INDUCED 02/16/2007   DEPRESSION, MAJOR 02/16/2007   Pulmonary emphysema ([place]) 02/16/2007   CARCINOMA, SQUAMOUS CELL 05/30/2001    ONSET DATE: since 2021  REFERRING DIAG: R29.898 (ICD-10-CM) - Weakness of both lower extremities G95.9 (ICD-10-CM) - Myelopathy ([place])   THERAPY DIAG:  Muscle weakness (generalized)  Other abnormalities of gait and mobility  Other symptoms and signs involving the musculoskeletal system  Unsteadiness on feet  Dizziness and giddiness  Rationale for Evaluation and Treatment Rehabilitation  SUBJECTIVE:  SUBJECTIVE STATEMENT: Feeling pretty good and able to take a few steps at home with the walker without legs buckling  Pt accompanied by: self  PERTINENT HISTORY: History of present illness: Starting ~2021, he began having difficulty walking, stating that he could not get his legs to move.  He feels that there is a disconnect between his brain and legs.  He tried using a walker but was falling frequently and has been predominately wheelchair-bound for the past few months.   For the past 6 weeks, he started having fatigue in the arms.  No muscle spasms or twitches  Myelopathy - Ddx: radiation-induced myelopathy vs primary lateral sclerosis. Evaluated at [facility] who favored radiation-induced myelopathy given that he also has sensory complaints in the upper extremities, in addition to prominent UMN findings   PAIN:  Are you having pain? No, feeling of chronic low back pain, worse when standing and extending, e.g. reaching overhead  PRECAUTIONS: Fall  WEIGHT BEARING RESTRICTIONS No  FALLS: Has patient fallen in last 6 months? No  PATIENT GOALS be able to stand long/safely enough to perform  activities like washing dishes and reaching coffee cups  OBJECTIVE:  TODAY'S TREATMENT: 04/01/22 Activity Comments  NU-step x 8 min   seated VOR x 1 -30 sec horizontal/vertical -near/far target: 2x15 sec  Standing gastroc stretch w/ slantboard 2x2 min  Standing and looking left/right/up/down to find and ID target 20 reps  Standing foot on step X 60 sec for SLS  BOSU lunges 2x10 Therapist assist for mechanics        [facility] Adult PT Treatment:                                                DATE: 03/29/22 Therapeutic Exercise: Quadriped rocking anterior/posterior cat/cow scapular protraction/retraction head turns for habituation Prone Knee flexion with assist x 5 reps Supine Hooklying marching x 10 reps Bridging x 10 reps Attempted rolling physioball to hips for bilat knee to chest, unable Neuromuscular re-ed: Habituation in standing frame with horizontal head turns, vertical head turns.  Used visual cues for spotting objects to lessen symptoms of retinal slip Therapeutic Activity: Standing frame x 5 minutes while adding habituation activities       PATIENT EDUCATION: Education details: HEP Person educated: Patient  Education method: Consulting civil engineer, Demonstration, and Corporate treasurer cues, handout Education comprehension: verbalized understanding and needs further education  HOME EXERCISE PROGRAM: Access Code: [email or web address] URL: [email or web address] Date: 03/29/2022 Prepared by: Rudell Cobb  Exercises - Cat Cow to Child's Pose  - 1 x daily - 7 x weekly - 10-20 reps - Prone Knee Flexion  - 1 x daily - 7 x weekly - 1-3 sets - 10 reps - Seated Gaze Stabilization with Head Rotation  - 1 x daily - 7 x weekly - 5-10 sets - 30 sec rounds hold - Seated Gaze Stabilization with Head Nod  - 1 x daily - 7 x weekly - 5-10 sets - 30 sec hold - Seated Right Head Turns Vestibular Habituation  - 1 x daily - 7 x weekly - 1 sets - 10 reps - Seated Head Nods Vestibular Habituation  -  1 x daily - 7 x weekly - 1 sets - 10 reps  __________________________________________________________________________________________________ From initial eval DIAGNOSTIC FINDINGS:  IMPRESSION: Asymmetric activity within normal shaped striatum is favored within NORMAL LIMITS. No focal  loss of dopamine transport activity to suggest Parkinson's syndrome pathology.  IMPRESSION: 1. Normal thoracic cord. 2. No acute abnormality or significant degenerative changes of the thoracic spine.  IMPRESSION: No acute or reversible finding. 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 Old small vessel infarctions of the thalami, basal ganglia and hemispheric white matter. Tiny old left occipital infarction.  MUSCLE LENGTH: WFL, tight to straight leg raise position with strong passive resistance at 35-50 degrees  DTRs:  Bilateral clonus, 2-4 beat  POSTURE: slumped, sacral sitting--not fixed, able to correct with cues, slight decrease in lumbar lordosis appreciated  LOWER EXTREMITY ROM:    WFL  (Blank rows = not tested)  LOWER EXTREMITY MMT:    MMT Right Eval Left Eval  Hip flexion 3 3  Hip extension    Hip abduction 2- 2-  Hip adduction 3 3  Hip internal rotation    Hip external rotation    Knee flexion 2+ 3  Knee extension 3+ 3+  Ankle dorsiflexion 2+ 2  Ankle plantarflexion 2+ 2+  Ankle inversion    Ankle eversion    (Blank rows = not tested)  Trunk flexion: 2+/5 UE: 5/5 resisted tests.  Able to perform bodyweight dip with RW (no LE contribution)  BED MOBILITY:  Modified indep for supine<>sit  TRANSFERS: Performs squat-pivot w/c to EOM with modified indep. Stand-pivot with RW and Supervision  FUNCTIONAL TESTs:  Static standing: Able to maintain unsupported standing 30-45 sec increments  __________________________________________________________________________________________________________________  GOALS: Goals reviewed with patient? Yes  SHORT TERM  GOALS: Target date: UPDATED/REVISED: 03/30/22  Patient will be independent in HEP to improve functional outcomes Baseline: Goal status: MET  2.  Patient will be able to tolerate (supported) prone position x 5 min to improve lumbar extension and progress for posterior chain strengthening Baseline: one-pillow support Goal status: MET  3.  Maintain supported standing x 5 min to improve participation in housekeeping and ADL participation Baseline: 1-3 min w/ BUE support on RW; 5+ min at counter with intermittent UE support, able to maintain unsupported standing 80% of the trial Goal status: MET  4. The patient will be independent with HEP for gaze adaptation, habituation, balance, and general mobility.  Baseline: newly initiated  Goals status: IN PROGRESS 5. Maintain unsupported standing x 2 min to improve safety with housekeeping and ADL participation  Baseline: 30 sec  Goal status: IN PROGRESS   LONG TERM GOALS: Target date: REVISED/UPDATED 04/21/22  Improve trunk flexion strength to 3/5, hip flexion strength 3+/5, and lumbar extension strength 3/5 to facilitate lumbar stability and reduce pain Baseline: 2+/5 flexion; 3/5 lumbar extension Goal status: On-going  2.  Demonstrate ability to stand x 5 min and perform unilateral reaching/overhead 1x15 reps left/right in order to improve participation and safety with washing dishes Baseline:  Goal status: MET  3. Demonstrate unsupported standing x 3 min to improve safety with housekeeping and ADL participation.  Baseline:  Goal status: IN PROGRESS  4. Report/demonstrate improved standing balance and tolerance to enable washing sink full of dishes.  Baseline: unable due to safety concerns  Goal status: IN PROGRESS   ASSESSMENT:  CLINICAL IMPRESSION: Continued with activities to promote VOR adaptation and stabilization and emphasis on cycling through targets to improve accuracy of movement and again gaze stabilization. Addition of  standing activities and promote single limb support and to progress to perfomring additional stabilization for gaze and balance in standing to improve safety with gait and transfers as well as ADL participation  OBJECTIVE IMPAIRMENTS Abnormal gait, decreased activity tolerance, decreased balance, decreased coordination, decreased endurance, decreased knowledge of use of DME, decreased mobility, difficulty walking, decreased strength, dizziness, impaired perceived functional ability, impaired flexibility, improper body mechanics, and postural dysfunction.   ACTIVITY LIMITATIONS carrying, lifting, standing, transfers, bed mobility, reach over head, and locomotion level  PARTICIPATION LIMITATIONS: meal prep, cleaning, laundry, community activity, and yard work  PERSONAL FACTORS Age, Time since onset of injury/illness/exacerbation, and 1-2 comorbidities: neurological condition, hx of CA  are also affecting patient's functional outcome.   REHAB POTENTIAL: Good  CLINICAL DECISION MAKING: Evolving/moderate complexity  EVALUATION COMPLEXITY: Moderate  PLAN: PT FREQUENCY: 1-2x/week  PT DURATION: 6 weeks  PLANNED INTERVENTIONS: Therapeutic exercises, Therapeutic activity, Neuromuscular re-education, Balance training, Gait training, Patient/Family education, Self Care, Joint mobilization, Joint manipulation, Stair training, Vestibular training, Canalith repositioning, Orthotic/Fit training, DME instructions, Aquatic Therapy, Dry Needling, Electrical stimulation, Wheelchair mobility training, Spinal mobilization, Cryotherapy, Moist heat, Splintting, Taping, Ultrasound, Biofeedback, and Manual therapy  PLAN FOR NEXT SESSION:  Functional training with head motion, sit<>stand, and loading to tolerance; re--tests positions?  11:10 AM, 04/01/22 M. Sherlyn Lees, PT, DPT Physical Therapist- [place] Office Number: [phone]

## 2022-04-04 ENCOUNTER — Ambulatory Visit: Payer: Medicare Other

## 2022-04-04 DIAGNOSIS — R42 Dizziness and giddiness: Secondary | ICD-10-CM

## 2022-04-04 DIAGNOSIS — R2689 Other abnormalities of gait and mobility: Secondary | ICD-10-CM

## 2022-04-04 DIAGNOSIS — R2681 Unsteadiness on feet: Secondary | ICD-10-CM

## 2022-04-04 DIAGNOSIS — M6281 Muscle weakness (generalized): Secondary | ICD-10-CM

## 2022-04-04 DIAGNOSIS — R29898 Other symptoms and signs involving the musculoskeletal system: Secondary | ICD-10-CM

## 2022-04-04 NOTE — Therapy (Signed)
OUTPATIENT PHYSICAL THERAPY NEURO TREATMENT    Patient Name: Lawrence Owen MRN: [id number] DOB:1955/11/12, 66 y.o., male Today's Date: 04/04/2022   PCP: Marda Stalker REFERRING PROVIDER: Alda Berthold, DO     PT End of Session - 04/04/22 1017     Visit Number 17    Number of Visits 24    Date for PT Re-Evaluation 04/20/22    Authorization Type Medicare/Medicaid    Progress Note Due on Visit 50    PT Start Time 1015    PT Stop Time 1100    PT Time Calculation (min) 45 min    Activity Tolerance Patient tolerated treatment well    Behavior During Therapy WFL for tasks assessed/performed              Past Medical History:  Diagnosis Date   Anxiety    Cancer of tongue ([place])    neck- lymp  nodes right; treated radiation   COPD (chronic obstructive pulmonary disease) ([place])    Hypertension    Hypothyroidism    OCD (obsessive compulsive disorder)    PTSD (post-traumatic stress disorder)    Stroke ([place])    "mini stoke"    Tingling of right upper extremity    Past Surgical History:  Procedure Laterality Date   NECK SURGERY  2003   lymph node removed   TONSILLECTOMY     TRANSCAROTID ARTERY REVASCULARIZATION  Right 02/21/2019   Procedure: TRANSCAROTID ARTERY REVASCULARIZATION;  Surgeon: Marty Heck, MD;  Location: [facility];  Service: Vascular;  Laterality: Right;   Patient Active Problem List   Diagnosis Date Noted   Carotid artery stenosis 02/21/2019   Carotid stenosis 01/15/2019   Stroke (cerebrum) ([place]) 03/04/2018   Alcohol use 03/04/2018   Essential hypertension 03/04/2018   Bilateral carotid artery disease ([place]) 03/04/2018   Essential hypertension, benign 03/12/2010   ALLERGIC RHINITIS 03/12/2010   HEADACHE 03/12/2010   CONTUSION, HEAD 01/27/2009   HYPERCHOLESTEROLEMIA 10/23/2008   LOW BACK PAIN SYNDROME 10/23/2008   GROIN PAIN 10/23/2008   CAROTID BRUIT, LEFT 09/18/2008   PERIODONTAL DISEASE 01/03/2008   DENTAL CARIES 03/13/2007    Hypothyroidism 02/16/2007   DISORDER, PERSISTING AMNESTIC, DRUG-INDUCED 02/16/2007   DEPRESSION, MAJOR 02/16/2007   Pulmonary emphysema ([place]) 02/16/2007   CARCINOMA, SQUAMOUS CELL 05/30/2001    ONSET DATE: since 2021  REFERRING DIAG: R29.898 (ICD-10-CM) - Weakness of both lower extremities G95.9 (ICD-10-CM) - Myelopathy ([place])   THERAPY DIAG:  Muscle weakness (generalized)  Other abnormalities of gait and mobility  Other symptoms and signs involving the musculoskeletal system  Unsteadiness on feet  Dizziness and giddiness  Rationale for Evaluation and Treatment Rehabilitation  SUBJECTIVE:  SUBJECTIVE STATEMENT: Feeling very wimpy today  Pt accompanied by: self  PERTINENT HISTORY: History of present illness: Starting ~2021, he began having difficulty walking, stating that he could not get his legs to move.  He feels that there is a disconnect between his brain and legs.  He tried using a walker but was falling frequently and has been predominately wheelchair-bound for the past few months.   For the past 6 weeks, he started having fatigue in the arms.  No muscle spasms or twitches  Myelopathy - Ddx: radiation-induced myelopathy vs primary lateral sclerosis. Evaluated at [facility] who favored radiation-induced myelopathy given that he also has sensory complaints in the upper extremities, in addition to prominent UMN findings   PAIN:  Are you having pain? No, feeling of chronic low back pain, worse when standing and extending, e.g. reaching overhead  PRECAUTIONS: Fall  WEIGHT BEARING RESTRICTIONS No  FALLS: Has patient fallen in last 6 months? No  PATIENT GOALS be able to stand long/safely enough to perform activities like washing dishes and reaching coffee  cups  OBJECTIVE:  TODAY'S TREATMENT: 04/04/22 Activity Comments  Standing at counter: bolster at knees for extension block -static stand with counter top x 2 min -single arm military press 3x10 7#, dumbell against cabinet  Standing at counter with head turns x 4 min Near target on left, turn to identify line/letter on Snellen chart on the right   Seated VOR x 1 -Horizontal/vertical x 30 sec -near/far target x 30 sec rounds  Gastroc stretch 2x2 min W/ slantboard           TODAY'S TREATMENT: 04/01/22 Activity Comments  NU-step x 8 min   seated VOR x 1 -30 sec horizontal/vertical -near/far target: 2x15 sec  Standing gastroc stretch w/ slantboard 2x2 min  Standing and looking left/right/up/down to find and ID target 20 reps  Standing foot on step X 60 sec for SLS  BOSU lunges 2x10 Therapist assist for mechanics               PATIENT EDUCATION: Education details: HEP Person educated: Patient  Education method: Explanation, Demonstration, and Tactile cues, handout Education comprehension: verbalized understanding and needs further education  HOME EXERCISE PROGRAM: Access Code: [email or web address] URL: [email or web address] Date: 03/29/2022 Prepared by: Rudell Cobb  Exercises - Cat Cow to Child's Pose  - 1 x daily - 7 x weekly - 10-20 reps - Prone Knee Flexion  - 1 x daily - 7 x weekly - 1-3 sets - 10 reps - Seated Gaze Stabilization with Head Rotation  - 1 x daily - 7 x weekly - 5-10 sets - 30 sec rounds hold - Seated Gaze Stabilization with Head Nod  - 1 x daily - 7 x weekly - 5-10 sets - 30 sec hold - Seated Right Head Turns Vestibular Habituation  - 1 x daily - 7 x weekly - 1 sets - 10 reps - Seated Head Nods Vestibular Habituation  - 1 x daily - 7 x weekly - 1 sets - 10 reps  __________________________________________________________________________________________________ From initial eval DIAGNOSTIC FINDINGS:  IMPRESSION: Asymmetric activity within  normal shaped striatum is favored within NORMAL LIMITS. No focal loss of dopamine transport activity to suggest Parkinson's syndrome pathology.  IMPRESSION: 1. Normal thoracic cord. 2. No acute abnormality or significant degenerative changes of the thoracic spine.  IMPRESSION: No acute or reversible finding. 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 Old small vessel infarctions of the thalami, basal ganglia and hemispheric  white matter. Tiny old left occipital infarction.  MUSCLE LENGTH: WFL, tight to straight leg raise position with strong passive resistance at 35-50 degrees  DTRs:  Bilateral clonus, 2-4 beat  POSTURE: slumped, sacral sitting--not fixed, able to correct with cues, slight decrease in lumbar lordosis appreciated  LOWER EXTREMITY ROM:    WFL  (Blank rows = not tested)  LOWER EXTREMITY MMT:    MMT Right Eval Left Eval  Hip flexion 3 3  Hip extension    Hip abduction 2- 2-  Hip adduction 3 3  Hip internal rotation    Hip external rotation    Knee flexion 2+ 3  Knee extension 3+ 3+  Ankle dorsiflexion 2+ 2  Ankle plantarflexion 2+ 2+  Ankle inversion    Ankle eversion    (Blank rows = not tested)  Trunk flexion: 2+/5 UE: 5/5 resisted tests.  Able to perform bodyweight dip with RW (no LE contribution)  BED MOBILITY:  Modified indep for supine<>sit  TRANSFERS: Performs squat-pivot w/c to EOM with modified indep. Stand-pivot with RW and Supervision  FUNCTIONAL TESTs:  Static standing: Able to maintain unsupported standing 30-45 sec increments  __________________________________________________________________________________________________________________  GOALS: Goals reviewed with patient? Yes  SHORT TERM GOALS: Target date: UPDATED/REVISED: 03/30/22  Patient will be independent in HEP to improve functional outcomes Baseline: Goal status: MET  2.  Patient will be able to tolerate (supported) prone position x 5 min to  improve lumbar extension and progress for posterior chain strengthening Baseline: one-pillow support Goal status: MET  3.  Maintain supported standing x 5 min to improve participation in housekeeping and ADL participation Baseline: 1-3 min w/ BUE support on RW; 5+ min at counter with intermittent UE support, able to maintain unsupported standing 80% of the trial Goal status: MET  4. The patient will be independent with HEP for gaze adaptation, habituation, balance, and general mobility.  Baseline: newly initiated  Goals status: IN PROGRESS 5. Maintain unsupported standing x 2 min to improve safety with housekeeping and ADL participation  Baseline: 30 sec  Goal status: IN PROGRESS   LONG TERM GOALS: Target date: REVISED/UPDATED 04/21/22  Improve trunk flexion strength to 3/5, hip flexion strength 3+/5, and lumbar extension strength 3/5 to facilitate lumbar stability and reduce pain Baseline: 2+/5 flexion; 3/5 lumbar extension Goal status: On-going  2.  Demonstrate ability to stand x 5 min and perform unilateral reaching/overhead 1x15 reps left/right in order to improve participation and safety with washing dishes Baseline:  Goal status: MET  3. Demonstrate unsupported standing x 3 min to improve safety with housekeeping and ADL participation.  Baseline:  Goal status: IN PROGRESS  4. Report/demonstrate improved standing balance and tolerance to enable washing sink full of dishes.  Baseline: unable due to safety concerns  Goal status: IN PROGRESS   ASSESSMENT:  CLINICAL IMPRESSION: Initiated with activities to improve standing tolerance, balance, and strength for overhead movements to improve safety with ADL. Continued with activities to promote gaze stabilization with cues in reduced amplitude but increased frequency for max benefit. Demo most pronounced symptoms with end-range of eye movements with end-range nystagmus very prominent, especially when eye glasses are removed.   Continued sessions to promote general strength and progress to vestibular training activities to improve safety with mobility and balance   OBJECTIVE IMPAIRMENTS Abnormal gait, decreased activity tolerance, decreased balance, decreased coordination, decreased endurance, decreased knowledge of use of DME, decreased mobility, difficulty walking, decreased strength, dizziness, impaired perceived functional ability, impaired flexibility, improper body mechanics,  and postural dysfunction.   ACTIVITY LIMITATIONS carrying, lifting, standing, transfers, bed mobility, reach over head, and locomotion level  PARTICIPATION LIMITATIONS: meal prep, cleaning, laundry, community activity, and yard work  PERSONAL FACTORS Age, Time since onset of injury/illness/exacerbation, and 1-2 comorbidities: neurological condition, hx of CA  are also affecting patient's functional outcome.   REHAB POTENTIAL: Good  CLINICAL DECISION MAKING: Evolving/moderate complexity  EVALUATION COMPLEXITY: Moderate  PLAN: PT FREQUENCY: 1-2x/week  PT DURATION: 6 weeks  PLANNED INTERVENTIONS: Therapeutic exercises, Therapeutic activity, Neuromuscular re-education, Balance training, Gait training, Patient/Family education, Self Care, Joint mobilization, Joint manipulation, Stair training, Vestibular training, Canalith repositioning, Orthotic/Fit training, DME instructions, Aquatic Therapy, Dry Needling, Electrical stimulation, Wheelchair mobility training, Spinal mobilization, Cryotherapy, Moist heat, Splintting, Taping, Ultrasound, Biofeedback, and Manual therapy  PLAN FOR NEXT SESSION:  Functional training with head motion, sit<>stand, and loading to tolerance; re--tests positions?  10:17 AM, 04/04/22 M. Sherlyn Lees, PT, DPT Physical Therapist- [place] Office Number: [phone]

## 2022-04-06 ENCOUNTER — Ambulatory Visit: Payer: Medicare Other

## 2022-04-06 DIAGNOSIS — R42 Dizziness and giddiness: Secondary | ICD-10-CM

## 2022-04-06 DIAGNOSIS — M6281 Muscle weakness (generalized): Secondary | ICD-10-CM

## 2022-04-06 DIAGNOSIS — R2681 Unsteadiness on feet: Secondary | ICD-10-CM

## 2022-04-06 DIAGNOSIS — R29898 Other symptoms and signs involving the musculoskeletal system: Secondary | ICD-10-CM

## 2022-04-06 DIAGNOSIS — R2689 Other abnormalities of gait and mobility: Secondary | ICD-10-CM

## 2022-04-06 NOTE — Therapy (Signed)
OUTPATIENT PHYSICAL THERAPY NEURO TREATMENT    Patient Name: Lawrence Owen MRN: [id number] DOB:1956/03/08, 66 y.o., male Today's Date: 04/06/2022   PCP: Marda Stalker REFERRING PROVIDER: Alda Berthold, DO     PT End of Session - 04/06/22 1032     Visit Number 18    Number of Visits 24    Date for PT Re-Evaluation 04/20/22    Authorization Type Medicare/Medicaid    Progress Note Due on Visit 19    PT Start Time 1015    PT Stop Time 1100    PT Time Calculation (min) 45 min    Activity Tolerance Patient tolerated treatment well    Behavior During Therapy WFL for tasks assessed/performed              Past Medical History:  Diagnosis Date   Anxiety    Cancer of tongue ([place])    neck- lymp  nodes right; treated radiation   COPD (chronic obstructive pulmonary disease) ([place])    Hypertension    Hypothyroidism    OCD (obsessive compulsive disorder)    PTSD (post-traumatic stress disorder)    Stroke ([place])    "mini stoke"    Tingling of right upper extremity    Past Surgical History:  Procedure Laterality Date   NECK SURGERY  2003   lymph node removed   TONSILLECTOMY     TRANSCAROTID ARTERY REVASCULARIZATION  Right 02/21/2019   Procedure: TRANSCAROTID ARTERY REVASCULARIZATION;  Surgeon: Marty Heck, MD;  Location: [facility];  Service: Vascular;  Laterality: Right;   Patient Active Problem List   Diagnosis Date Noted   Carotid artery stenosis 02/21/2019   Carotid stenosis 01/15/2019   Stroke (cerebrum) ([place]) 03/04/2018   Alcohol use 03/04/2018   Essential hypertension 03/04/2018   Bilateral carotid artery disease ([place]) 03/04/2018   Essential hypertension, benign 03/12/2010   ALLERGIC RHINITIS 03/12/2010   HEADACHE 03/12/2010   CONTUSION, HEAD 01/27/2009   HYPERCHOLESTEROLEMIA 10/23/2008   LOW BACK PAIN SYNDROME 10/23/2008   GROIN PAIN 10/23/2008   CAROTID BRUIT, LEFT 09/18/2008   PERIODONTAL DISEASE 01/03/2008   DENTAL CARIES 03/13/2007    Hypothyroidism 02/16/2007   DISORDER, PERSISTING AMNESTIC, DRUG-INDUCED 02/16/2007   DEPRESSION, MAJOR 02/16/2007   Pulmonary emphysema ([place]) 02/16/2007   CARCINOMA, SQUAMOUS CELL 05/30/2001    ONSET DATE: since 2021  REFERRING DIAG: R29.898 (ICD-10-CM) - Weakness of both lower extremities G95.9 (ICD-10-CM) - Myelopathy ([place])   THERAPY DIAG:  Muscle weakness (generalized)  Other abnormalities of gait and mobility  Other symptoms and signs involving the musculoskeletal system  Unsteadiness on feet  Dizziness and giddiness  Rationale for Evaluation and Treatment Rehabilitation  SUBJECTIVE:  SUBJECTIVE STATEMENT: Still feeling weak and fatigued  Pt accompanied by: self  PERTINENT HISTORY: History of present illness: Starting ~2021, he began having difficulty walking, stating that he could not get his legs to move.  He feels that there is a disconnect between his brain and legs.  He tried using a walker but was falling frequently and has been predominately wheelchair-bound for the past few months.   For the past 6 weeks, he started having fatigue in the arms.  No muscle spasms or twitches  Myelopathy - Ddx: radiation-induced myelopathy vs primary lateral sclerosis. Evaluated at [facility] who favored radiation-induced myelopathy given that he also has sensory complaints in the upper extremities, in addition to prominent UMN findings   PAIN:  Are you having pain? No, feeling of chronic low back pain, worse when standing and extending, e.g. reaching overhead  PRECAUTIONS: Fall  WEIGHT BEARING RESTRICTIONS No  FALLS: Has patient fallen in last 6 months? No  PATIENT GOALS be able to stand long/safely enough to perform activities like washing dishes and reaching coffee  cups  OBJECTIVE:  TODAY'S TREATMENT: 04/06/22 Activity Comments  Standing military press at counter 8# Head turns to target while performing press 3x10, varying letters  Throwing lettered ball 3x10 Visual fixation on moving target  Mat table exercise 3x10 SAQ, hip add/abd isometric, heel slides to improve proximal strength for mobility and transfers              TODAY'S TREATMENT: 04/04/22 Activity Comments  Standing at counter: bolster at knees for extension block -static stand with counter top x 2 min -single arm military press 3x10 7#, dumbell against cabinet  Standing at counter with head turns x 4 min Near target on left, turn to identify line/letter on Snellen chart on the right   Seated VOR x 1 -Horizontal/vertical x 30 sec -near/far target x 30 sec rounds  Gastroc stretch 2x2 min W/ slantboard                   PATIENT EDUCATION: Education details: HEP Person educated: Patient  Education method: Consulting civil engineer, Demonstration, and Tactile cues, handout Education comprehension: verbalized understanding and needs further education  HOME EXERCISE PROGRAM: Access Code: [email or web address] URL: [email or web address] Date: 03/29/2022 Prepared by: Rudell Cobb  Exercises - Cat Cow to Child's Pose  - 1 x daily - 7 x weekly - 10-20 reps - Prone Knee Flexion  - 1 x daily - 7 x weekly - 1-3 sets - 10 reps - Seated Gaze Stabilization with Head Rotation  - 1 x daily - 7 x weekly - 5-10 sets - 30 sec rounds hold - Seated Gaze Stabilization with Head Nod  - 1 x daily - 7 x weekly - 5-10 sets - 30 sec hold - Seated Right Head Turns Vestibular Habituation  - 1 x daily - 7 x weekly - 1 sets - 10 reps - Seated Head Nods Vestibular Habituation  - 1 x daily - 7 x weekly - 1 sets - 10 reps  __________________________________________________________________________________________________ From initial eval DIAGNOSTIC FINDINGS:  IMPRESSION: Asymmetric activity within normal  shaped striatum is favored within NORMAL LIMITS. No focal loss of dopamine transport activity to suggest Parkinson's syndrome pathology.  IMPRESSION: 1. Normal thoracic cord. 2. No acute abnormality or significant degenerative changes of the thoracic spine.  IMPRESSION: No acute or reversible finding. 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 Old small vessel infarctions of the thalami, basal ganglia and hemispheric white matter.  Tiny old left occipital infarction.  MUSCLE LENGTH: WFL, tight to straight leg raise position with strong passive resistance at 35-50 degrees  DTRs:  Bilateral clonus, 2-4 beat  POSTURE: slumped, sacral sitting--not fixed, able to correct with cues, slight decrease in lumbar lordosis appreciated  LOWER EXTREMITY ROM:    WFL  (Blank rows = not tested)  LOWER EXTREMITY MMT:    MMT Right Eval Left Eval  Hip flexion 3 3  Hip extension    Hip abduction 2- 2-  Hip adduction 3 3  Hip internal rotation    Hip external rotation    Knee flexion 2+ 3  Knee extension 3+ 3+  Ankle dorsiflexion 2+ 2  Ankle plantarflexion 2+ 2+  Ankle inversion    Ankle eversion    (Blank rows = not tested)  Trunk flexion: 2+/5 UE: 5/5 resisted tests.  Able to perform bodyweight dip with RW (no LE contribution)  BED MOBILITY:  Modified indep for supine<>sit  TRANSFERS: Performs squat-pivot w/c to EOM with modified indep. Stand-pivot with RW and Supervision  FUNCTIONAL TESTs:  Static standing: Able to maintain unsupported standing 30-45 sec increments  __________________________________________________________________________________________________________________  GOALS: Goals reviewed with patient? Yes  SHORT TERM GOALS: Target date: UPDATED/REVISED: 03/30/22  Patient will be independent in HEP to improve functional outcomes Baseline: Goal status: MET  2.  Patient will be able to tolerate (supported) prone position x 5 min to improve  lumbar extension and progress for posterior chain strengthening Baseline: one-pillow support Goal status: MET  3.  Maintain supported standing x 5 min to improve participation in housekeeping and ADL participation Baseline: 1-3 min w/ BUE support on RW; 5+ min at counter with intermittent UE support, able to maintain unsupported standing 80% of the trial Goal status: MET  4. The patient will be independent with HEP for gaze adaptation, habituation, balance, and general mobility.  Baseline: newly initiated  Goals status: IN PROGRESS 5. Maintain unsupported standing x 2 min to improve safety with housekeeping and ADL participation  Baseline: 30 sec  Goal status: IN PROGRESS   LONG TERM GOALS: Target date: REVISED/UPDATED 04/21/22  Improve trunk flexion strength to 3/5, hip flexion strength 3+/5, and lumbar extension strength 3/5 to facilitate lumbar stability and reduce pain Baseline: 2+/5 flexion; 3/5 lumbar extension Goal status: On-going  2.  Demonstrate ability to stand x 5 min and perform unilateral reaching/overhead 1x15 reps left/right in order to improve participation and safety with washing dishes Baseline:  Goal status: MET  3. Demonstrate unsupported standing x 3 min to improve safety with housekeeping and ADL participation.  Baseline:  Goal status: IN PROGRESS  4. Report/demonstrate improved standing balance and tolerance to enable washing sink full of dishes.  Baseline: unable due to safety concerns  Goal status: IN PROGRESS   ASSESSMENT:  CLINICAL IMPRESSION: CGA needed for sit-stand form w/c height dependent on UE support. Initiated dual-task activities to improve VOR engagement and demo good ability to maintain motor task while performing head turns and visual scanning to acquire target. Therapist resistance for engaging pelvic muscles in mat table activities to improve isolation and recruitment. Pt notes oscillopsia is at its worst with end-range left gaze vs  right and as such during VOR activity use of two targets on left vs Snellen chart on the right. Continued sessions indicated to progress motor control, BLE strength, and advance vestibular activities for gaze stabilization   OBJECTIVE IMPAIRMENTS Abnormal gait, decreased activity tolerance, decreased balance, decreased coordination, decreased endurance, decreased knowledge  of use of DME, decreased mobility, difficulty walking, decreased strength, dizziness, impaired perceived functional ability, impaired flexibility, improper body mechanics, and postural dysfunction.   ACTIVITY LIMITATIONS carrying, lifting, standing, transfers, bed mobility, reach over head, and locomotion level  PARTICIPATION LIMITATIONS: meal prep, cleaning, laundry, community activity, and yard work  PERSONAL FACTORS Age, Time since onset of injury/illness/exacerbation, and 1-2 comorbidities: neurological condition, hx of CA  are also affecting patient's functional outcome.   REHAB POTENTIAL: Good  CLINICAL DECISION MAKING: Evolving/moderate complexity  EVALUATION COMPLEXITY: Moderate  PLAN: PT FREQUENCY: 1-2x/week  PT DURATION: 6 weeks  PLANNED INTERVENTIONS: Therapeutic exercises, Therapeutic activity, Neuromuscular re-education, Balance training, Gait training, Patient/Family education, Self Care, Joint mobilization, Joint manipulation, Stair training, Vestibular training, Canalith repositioning, Orthotic/Fit training, DME instructions, Aquatic Therapy, Dry Needling, Electrical stimulation, Wheelchair mobility training, Spinal mobilization, Cryotherapy, Moist heat, Splintting, Taping, Ultrasound, Biofeedback, and Manual therapy  PLAN FOR NEXT SESSION:  Functional training with head motion, sit<>stand, and loading to tolerance; re--tests positions?  10:35 AM, 04/06/22 M. Sherlyn Lees, PT, DPT Physical Therapist- [place] Office Number: [phone]

## 2022-04-12 NOTE — Therapy (Signed)
OUTPATIENT PHYSICAL THERAPY NEURO TREATMENT    Patient Name: Lawrence Owen MRN: [id number] DOB:1955/10/07, 66 y.o., male Today's Date: 04/13/2022   PCP: Lawrence Owen REFERRING PROVIDER: Alda Berthold, DO     PT End of Session - 04/13/22 1014     Visit Number 19    Number of Visits 24    Date for PT Re-Evaluation 04/20/22    Authorization Type Medicare/Medicaid    Progress Note Due on Visit 38    PT Start Time 0933    PT Stop Time 1013    PT Time Calculation (min) 40 min    Equipment Utilized During Treatment Gait belt    Activity Tolerance Patient tolerated treatment well    Behavior During Therapy WFL for tasks assessed/performed               Past Medical History:  Diagnosis Date   Anxiety    Cancer of tongue ([place])    neck- lymp  nodes right; treated radiation   COPD (chronic obstructive pulmonary disease) ([place])    Hypertension    Hypothyroidism    OCD (obsessive compulsive disorder)    PTSD (post-traumatic stress disorder)    Stroke ([place])    "mini stoke"    Tingling of right upper extremity    Past Surgical History:  Procedure Laterality Date   NECK SURGERY  2003   lymph node removed   TONSILLECTOMY     TRANSCAROTID ARTERY REVASCULARIZATION  Right 02/21/2019   Procedure: TRANSCAROTID ARTERY REVASCULARIZATION;  Surgeon: Lawrence Heck, MD;  Location: [facility];  Service: Vascular;  Laterality: Right;   Patient Active Problem List   Diagnosis Date Noted   Carotid artery stenosis 02/21/2019   Carotid stenosis 01/15/2019   Stroke (cerebrum) ([place]) 03/04/2018   Alcohol use 03/04/2018   Essential hypertension 03/04/2018   Bilateral carotid artery disease ([place]) 03/04/2018   Essential hypertension, benign 03/12/2010   ALLERGIC RHINITIS 03/12/2010   HEADACHE 03/12/2010   CONTUSION, HEAD 01/27/2009   HYPERCHOLESTEROLEMIA 10/23/2008   LOW BACK PAIN SYNDROME 10/23/2008   GROIN PAIN 10/23/2008   CAROTID BRUIT, LEFT 09/18/2008   PERIODONTAL  DISEASE 01/03/2008   DENTAL CARIES 03/13/2007   Hypothyroidism 02/16/2007   DISORDER, PERSISTING AMNESTIC, DRUG-INDUCED 02/16/2007   DEPRESSION, MAJOR 02/16/2007   Pulmonary emphysema ([place]) 02/16/2007   CARCINOMA, SQUAMOUS CELL 05/30/2001    ONSET DATE: since 2021  REFERRING DIAG: R29.898 (ICD-10-CM) - Weakness of both lower extremities G95.9 (ICD-10-CM) - Myelopathy ([place])   THERAPY DIAG:  Muscle weakness (generalized)  Other abnormalities of gait and mobility  Other symptoms and signs involving the musculoskeletal system  Unsteadiness on feet  Dizziness and giddiness  Rationale for Evaluation and Treatment Rehabilitation  SUBJECTIVE:  SUBJECTIVE STATEMENT: Saturday 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 Wife pushed him into the house and by the time he arrived, he slid out of his w/c. Army crawled to the couch and was able to get up that way on his own. No other new or lasting symptoms.   Pt accompanied by: self  PERTINENT HISTORY: History of present illness: Starting ~2021, he began having difficulty walking, stating that he could not get his legs to move.  He feels that there is a disconnect between his brain and legs.  He tried using a walker but was falling frequently and has been predominately wheelchair-bound for the past few months.   For the past 6 weeks, he started having fatigue in the arms.  No muscle spasms or twitches  Myelopathy - Ddx: radiation-induced myelopathy vs primary lateral sclerosis. Evaluated at [facility] who favored radiation-induced myelopathy given that he also has sensory complaints in the upper extremities, in addition to prominent UMN findings   PAIN:  Are you having pain? No, feeling of chronic low  back pain, worse when standing and extending, e.g. reaching overhead  PRECAUTIONS: Fall  WEIGHT BEARING RESTRICTIONS No  FALLS: Has patient fallen in last 6 months? No  PATIENT GOALS be able to stand long/safely enough to perform activities like washing dishes and reaching coffee cups  OBJECTIVE:   TODAY'S TREATMENT: 04/13/22 Activity Comments  Vitals at start of session 129/95 mmHg, 99% spO2, 65 bpm  sit<>stand   Standing at counter x3 min  Maintaining conversation and no UE support required   standing at counter to + head turns/nods 2x30" each C/o imbalance; CGA  standing at counter to + alt arm raise x20 Good stability  Standing March x10  Very limited amplitude and B UE use  sidestepping along counter 2x  PT following behind with w/c   STS from w/c 5x  Min A d/t LE fatigue/shaking    PATIENT EDUCATION: Education details: discussed patient's fall and recent sx of dizziness Person educated: Patient Education method: Explanation Education comprehension: verbalized understanding    HOME EXERCISE PROGRAM: Access Code: [email or web address] URL: [email or web address] Date: 03/29/2022 Prepared by: Rudell Cobb  Exercises - Cat Cow to Child's Pose  - 1 x daily - 7 x weekly - 10-20 reps - Prone Knee Flexion  - 1 x daily - 7 x weekly - 1-3 sets - 10 reps - Seated Gaze Stabilization with Head Rotation  - 1 x daily - 7 x weekly - 5-10 sets - 30 sec rounds hold - Seated Gaze Stabilization with Head Nod  - 1 x daily - 7 x weekly - 5-10 sets - 30 sec hold - Seated Right Head Turns Vestibular Habituation  - 1 x daily - 7 x weekly - 1 sets - 10 reps - Seated Head Nods Vestibular Habituation  - 1 x daily - 7 x weekly - 1 sets - 10 reps  __________________________________________________________________________________________________ From initial eval DIAGNOSTIC FINDINGS:  IMPRESSION: Asymmetric activity within normal shaped striatum is favored within NORMAL LIMITS. No  focal loss of dopamine transport activity to suggest Parkinson's syndrome pathology.  IMPRESSION: 1. Normal thoracic cord. 2. No acute abnormality or significant degenerative changes of the thoracic spine.  IMPRESSION: No acute or reversible finding. 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 Old small vessel infarctions of the thalami, basal ganglia and hemispheric white matter. Tiny old left occipital infarction.  MUSCLE LENGTH: WFL,  tight to straight leg raise position with strong passive resistance at 35-50 degrees  DTRs:  Bilateral clonus, 2-4 beat  POSTURE: slumped, sacral sitting--not fixed, able to correct with cues, slight decrease in lumbar lordosis appreciated  LOWER EXTREMITY ROM:    WFL  (Blank rows = not tested)  LOWER EXTREMITY MMT:    MMT Right Eval Left Eval  Hip flexion 3 3  Hip extension    Hip abduction 2- 2-  Hip adduction 3 3  Hip internal rotation    Hip external rotation    Knee flexion 2+ 3  Knee extension 3+ 3+  Ankle dorsiflexion 2+ 2  Ankle plantarflexion 2+ 2+  Ankle inversion    Ankle eversion    (Blank rows = not tested)  Trunk flexion: 2+/5 UE: 5/5 resisted tests.  Able to perform bodyweight dip with RW (no LE contribution)  BED MOBILITY:  Modified indep for supine<>sit  TRANSFERS: Performs squat-pivot w/c to EOM with modified indep. Stand-pivot with RW and Supervision  FUNCTIONAL TESTs:  Static standing: Able to maintain unsupported standing 30-45 sec increments  __________________________________________________________________________________________________________________  GOALS: Goals reviewed with patient? Yes  SHORT TERM GOALS: Target date: UPDATED/REVISED: 03/30/22  Patient will be independent in HEP to improve functional outcomes Baseline: Goal status: MET  2.  Patient will be able to tolerate (supported) prone position x 5 min to improve lumbar extension and progress for posterior chain  strengthening Baseline: one-pillow support Goal status: MET  3.  Maintain supported standing x 5 min to improve participation in housekeeping and ADL participation Baseline: 1-3 min w/ BUE support on RW; 5+ min at counter with intermittent UE support, able to maintain unsupported standing 80% of the trial Goal status: MET  4. The patient will be independent with HEP for gaze adaptation, habituation, balance, and general mobility.  Baseline: newly initiated  Goals status: IN PROGRESS 5. Maintain unsupported standing x 2 min to improve safety with housekeeping and ADL participation  Baseline: 30 sec  Goal status: IN PROGRESS   LONG TERM GOALS: Target date: REVISED/UPDATED 04/21/22  Improve trunk flexion strength to 3/5, hip flexion strength 3+/5, and lumbar extension strength 3/5 to facilitate lumbar stability and reduce pain Baseline: 2+/5 flexion; 3/5 lumbar extension Goal status: On-going  2.  Demonstrate ability to stand x 5 min and perform unilateral reaching/overhead 1x15 reps left/right in order to improve participation and safety with washing dishes Baseline:  Goal status: MET  3. Demonstrate unsupported standing x 3 min to improve safety with housekeeping and ADL participation.  Baseline:  Goal status: IN PROGRESS  4. Report/demonstrate improved standing balance and tolerance to enable washing sink full of dishes.  Baseline: unable due to safety concerns  Goal status: IN PROGRESS   ASSESSMENT:  CLINICAL IMPRESSION: Patient arrived to session with report of an episode of spontaneous dizziness on Saturday, resulting in sliding out of w/c. Reports that he was able to transfer off the floor without assist and denies any other new or lasting symptoms. Vitals today were slightly elevated in diastolic BP, otherwise WNL. Worked on standing activities- patient able to tolerate unsupported standing for long durations without UE support. Provided dynamic balance challenges with no  c/o dizziness today, however did demonstrate imbalance occasionally. Standing LE strengthening appeared most difficulty activating hip flexors. Patient tolerated session well and without complaints upon leaving.    OBJECTIVE IMPAIRMENTS Abnormal gait, decreased activity tolerance, decreased balance, decreased coordination, decreased endurance, decreased knowledge of use of DME, decreased mobility, difficulty walking,  decreased strength, dizziness, impaired perceived functional ability, impaired flexibility, improper body mechanics, and postural dysfunction.   ACTIVITY LIMITATIONS carrying, lifting, standing, transfers, bed mobility, reach over head, and locomotion level  PARTICIPATION LIMITATIONS: meal prep, cleaning, laundry, community activity, and yard work  PERSONAL FACTORS Age, Time since onset of injury/illness/exacerbation, and 1-2 comorbidities: neurological condition, hx of CA  are also affecting patient's functional outcome.   REHAB POTENTIAL: Good  CLINICAL DECISION MAKING: Evolving/moderate complexity  EVALUATION COMPLEXITY: Moderate  PLAN: PT FREQUENCY: 1-2x/week  PT DURATION: 6 weeks  PLANNED INTERVENTIONS: Therapeutic exercises, Therapeutic activity, Neuromuscular re-education, Balance training, Gait training, Patient/Family education, Self Care, Joint mobilization, Joint manipulation, Stair training, Vestibular training, Canalith repositioning, Orthotic/Fit training, DME instructions, Aquatic Therapy, Dry Needling, Electrical stimulation, Wheelchair mobility training, Spinal mobilization, Cryotherapy, Moist heat, Splintting, Taping, Ultrasound, Biofeedback, and Manual therapy  PLAN FOR NEXT SESSION:  Functional training with head motion, sit<>stand, and loading to tolerance; re--tests positions?   Janene Harvey, PT, DPT 04/13/22 10:22 AM  [facility] [address] [place], [place], [place] [address] Phone # [phone] Fax # [phone]

## 2022-04-13 ENCOUNTER — Ambulatory Visit: Payer: Medicare Other | Admitting: Physical Therapy

## 2022-04-13 ENCOUNTER — Encounter: Payer: Self-pay | Admitting: Physical Therapy

## 2022-04-13 DIAGNOSIS — R29898 Other symptoms and signs involving the musculoskeletal system: Secondary | ICD-10-CM

## 2022-04-13 DIAGNOSIS — R2681 Unsteadiness on feet: Secondary | ICD-10-CM

## 2022-04-13 DIAGNOSIS — M6281 Muscle weakness (generalized): Secondary | ICD-10-CM | POA: Diagnosis not present

## 2022-04-13 DIAGNOSIS — R2689 Other abnormalities of gait and mobility: Secondary | ICD-10-CM

## 2022-04-13 DIAGNOSIS — R42 Dizziness and giddiness: Secondary | ICD-10-CM

## 2022-04-14 ENCOUNTER — Ambulatory Visit: Payer: Medicare Other

## 2022-04-14 DIAGNOSIS — R29898 Other symptoms and signs involving the musculoskeletal system: Secondary | ICD-10-CM

## 2022-04-14 DIAGNOSIS — M6281 Muscle weakness (generalized): Secondary | ICD-10-CM | POA: Diagnosis not present

## 2022-04-14 DIAGNOSIS — R2689 Other abnormalities of gait and mobility: Secondary | ICD-10-CM

## 2022-04-14 DIAGNOSIS — R42 Dizziness and giddiness: Secondary | ICD-10-CM

## 2022-04-14 DIAGNOSIS — R2681 Unsteadiness on feet: Secondary | ICD-10-CM

## 2022-04-14 NOTE — Therapy (Signed)
OUTPATIENT PHYSICAL THERAPY NEURO TREATMENT and Progress Note    Patient Name: Lawrence Owen MRN: [id number] DOB:August 29, 1955, 66 y.o., male Today's Date: 04/14/2022   PCP: Marda Stalker REFERRING PROVIDER: Alda Berthold, DO   Progress Note Reporting Period 03/09/22 to 04/14/22  See note below for Objective Data and Assessment of Progress/Goals.        PT End of Session - 04/14/22 1020     Visit Number 20    Number of Visits 24    Date for PT Re-Evaluation 04/20/22    Authorization Type Medicare/Medicaid    Progress Note Due on Visit 74    PT Start Time 1018    PT Stop Time 1100    PT Time Calculation (min) 42 min    Equipment Utilized During Treatment Gait belt    Activity Tolerance Patient tolerated treatment well    Behavior During Therapy WFL for tasks assessed/performed               Past Medical History:  Diagnosis Date   Anxiety    Cancer of tongue ([place])    neck- lymp  nodes right; treated radiation   COPD (chronic obstructive pulmonary disease) ([facility])    Hypertension    Hypothyroidism    OCD (obsessive compulsive disorder)    PTSD (post-traumatic stress disorder)    Stroke ([place])    "mini stoke"    Tingling of right upper extremity    Past Surgical History:  Procedure Laterality Date   NECK SURGERY  2003   lymph node removed   TONSILLECTOMY     TRANSCAROTID ARTERY REVASCULARIZATION  Right 02/21/2019   Procedure: TRANSCAROTID ARTERY REVASCULARIZATION;  Surgeon: Marty Heck, MD;  Location: [facility];  Service: Vascular;  Laterality: Right;   Patient Active Problem List   Diagnosis Date Noted   Carotid artery stenosis 02/21/2019   Carotid stenosis 01/15/2019   Stroke (cerebrum) ([place]) 03/04/2018   Alcohol use 03/04/2018   Essential hypertension 03/04/2018   Bilateral carotid artery disease ([place]) 03/04/2018   Essential hypertension, benign 03/12/2010   ALLERGIC RHINITIS 03/12/2010   HEADACHE 03/12/2010   CONTUSION, HEAD 01/27/2009    HYPERCHOLESTEROLEMIA 10/23/2008   LOW BACK PAIN SYNDROME 10/23/2008   GROIN PAIN 10/23/2008   CAROTID BRUIT, LEFT 09/18/2008   PERIODONTAL DISEASE 01/03/2008   DENTAL CARIES 03/13/2007   Hypothyroidism 02/16/2007   DISORDER, PERSISTING AMNESTIC, DRUG-INDUCED 02/16/2007   DEPRESSION, MAJOR 02/16/2007   Pulmonary emphysema ([place]) 02/16/2007   CARCINOMA, SQUAMOUS CELL 05/30/2001    ONSET DATE: since 2021  REFERRING DIAG: R29.898 (ICD-10-CM) - Weakness of both lower extremities G95.9 (ICD-10-CM) - Myelopathy ([facility])   THERAPY DIAG:  Muscle weakness (generalized)  Other abnormalities of gait and mobility  Other symptoms and signs involving the musculoskeletal system  Unsteadiness on feet  Dizziness and giddiness  Rationale for Evaluation and Treatment Rehabilitation  SUBJECTIVE:  SUBJECTIVE STATEMENT: Not having any issues from the recent fall from wheelchair.    Pt accompanied by: self  PERTINENT HISTORY: History of present illness: Starting ~2021, he began having difficulty walking, stating that he could not get his legs to move.  He feels that there is a disconnect between his brain and legs.  He tried using a walker but was falling frequently and has been predominately wheelchair-bound for the past few months.   For the past 6 weeks, he started having fatigue in the arms.  No muscle spasms or twitches  Myelopathy - Ddx: radiation-induced myelopathy vs primary lateral sclerosis. Evaluated at [facility] who favored radiation-induced myelopathy given that he also has sensory complaints in the upper extremities, in addition to prominent UMN findings   PAIN:  Are you having pain? No, feeling of chronic low back pain, worse when standing and extending, e.g. reaching  overhead  PRECAUTIONS: Fall  WEIGHT BEARING RESTRICTIONS No  FALLS: Has patient fallen in last 6 months? No  PATIENT GOALS be able to stand long/safely enough to perform activities like washing dishes and reaching coffee cups  OBJECTIVE:  TODAY'S TREATMENT: 04/14/22 Activity Comments  Standing unsupported x 3 min Intermittent UE touch on counter while donning/doffing jacket  Transfer training Engaged in various functional transfers to generalize movement strategies and improve stand-pivot vs squat-pivot  Seated ocular movements -gaze evoked nystagmus greater at end-range left > right  Seated VOR  Horizontal: cues for smaller amplitude, increased frequency  Dix-Hallpike No positional nystagmus noted, more with gaze direction  Manual muscle test   HEP review      TODAY'S TREATMENT: 04/13/22 Activity Comments  Vitals at start of session 129/95 mmHg, 99% spO2, 65 bpm  sit<>stand   Standing at counter x3 min  Maintaining conversation and no UE support required   standing at counter to + head turns/nods 2x30" each C/o imbalance; CGA  standing at counter to + alt arm raise x20 Good stability  Standing March x10  Very limited amplitude and B UE use  sidestepping along counter 2x  PT following behind with w/c   STS from w/c 5x  Min A d/t LE fatigue/shaking    PATIENT EDUCATION: Education details: discussed patient's fall and recent sx of dizziness Person educated: Patient Education method: Explanation Education comprehension: verbalized understanding    HOME EXERCISE PROGRAM: Access Code: [email or web address] URL: [email or web address] Date: 03/29/2022 Prepared by: Rudell Cobb  Exercises - Cat Cow to Child's Pose  - 1 x daily - 7 x weekly - 10-20 reps - Prone Knee Flexion  - 1 x daily - 7 x weekly - 1-3 sets - 10 reps - Seated Gaze Stabilization with Head Rotation  - 1 x daily - 7 x weekly - 5-10 sets - 30 sec rounds hold - Seated Gaze Stabilization with Head Nod  -  1 x daily - 7 x weekly - 5-10 sets - 30 sec hold - Seated Right Head Turns Vestibular Habituation  - 1 x daily - 7 x weekly - 1 sets - 10 reps - Seated Head Nods Vestibular Habituation  - 1 x daily - 7 x weekly - 1 sets - 10 reps  __________________________________________________________________________________________________ From initial eval DIAGNOSTIC FINDINGS:  IMPRESSION: Asymmetric activity within normal shaped striatum is favored within NORMAL LIMITS. No focal loss of dopamine transport activity to suggest Parkinson's syndrome pathology.  IMPRESSION: 1. Normal thoracic cord. 2. No acute abnormality or significant degenerative changes of the thoracic spine.  IMPRESSION: No  acute or reversible finding. 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 Old small vessel infarctions of the thalami, basal ganglia and hemispheric white matter. Tiny old left occipital infarction.  MUSCLE LENGTH: WFL, tight to straight leg raise position with strong passive resistance at 35-50 degrees  DTRs:  Bilateral clonus, 2-4 beat  POSTURE: slumped, sacral sitting--not fixed, able to correct with cues, slight decrease in lumbar lordosis appreciated  LOWER EXTREMITY ROM:    WFL  (Blank rows = not tested)  LOWER EXTREMITY MMT:    MMT Right Eval Left Eval Right 04/14/22 Left 04/14/22  Hip flexion _0 Hip extension      Hip abduction 2- 2- 2- 2-  Hip adduction _1 Hip internal rotation      Hip external rotation      Knee flexion 2+ 3 2+ 2+  Knee extension 3+ 3+ 3+ 3+  Ankle dorsiflexion 2+ _2 Ankle plantarflexion 2+ 2+    Ankle inversion      Ankle eversion      (Blank rows = not tested)  Trunk flexion: 2+/5 UE: 5/5 resisted tests.  Able to perform bodyweight dip with RW (no LE contribution)  BED MOBILITY:  Modified indep for supine<>sit  TRANSFERS: Performs squat-pivot w/c to EOM with modified indep. Stand-pivot with RW and  Supervision  FUNCTIONAL TESTs:  Static standing: Able to maintain unsupported standing 30-45 sec increments  __________________________________________________________________________________________________________________  GOALS: Goals reviewed with patient? Yes  SHORT TERM GOALS: Target date: UPDATED/REVISED: 03/30/22  Patient will be independent in HEP to improve functional outcomes Baseline: Goal status: MET  2.  Patient will be able to tolerate (supported) prone position x 5 min to improve lumbar extension and progress for posterior chain strengthening Baseline: one-pillow support Goal status: MET  3.  Maintain supported standing x 5 min to improve participation in housekeeping and ADL participation Baseline: 1-3 min w/ BUE support on RW; 5+ min at counter with intermittent UE support, able to maintain unsupported standing 80% of the trial Goal status: MET  4. The patient will be independent with HEP for gaze adaptation, habituation, balance, and general mobility.  Baseline: newly initiated  Goals status: IN PROGRESS  5. Maintain unsupported standing x 2 min to improve safety with housekeeping and ADL participation  Baseline: 30 sec; (04/14/22) 2 min intermittent UE on counter when donning/doffing jacket  Goal status: MET   LONG TERM GOALS: Target date: REVISED/UPDATED 04/21/22  Improve trunk flexion strength to 3/5, hip flexion strength 3+/5, and lumbar extension strength 3/5 to facilitate lumbar stability and reduce pain Baseline: 2+/5 flexion; 3/5 lumbar extension Goal status: On-going  2.  Demonstrate ability to stand x 5 min and perform unilateral reaching/overhead 1x15 reps left/right in order to improve participation and safety with washing dishes Baseline:  Goal status: MET  3. Demonstrate unsupported standing x 3 min to improve safety with housekeeping and ADL participation.  Baseline:  Goal status: MET  4. Report/demonstrate improved standing balance and  tolerance to enable washing sink full of dishes.  Baseline: unable due to safety concerns  Goal status: IN PROGRESS   ASSESSMENT:  CLINICAL IMPRESSION: Continues to exhibit gaze-evoked nystagmus with greater intensity/frequency with left gaze vs right (possibly direction-changing?) Able to demo improved standing balance/tolerance with intermittent UE support mainly when reaching outside BOS. Recommend use of bolster/cushions at home to safely perform in kitchen to provide for extension blocking to enable functional task performance, e.g.  wash dishes. Strength testing reveals no significant change in strength other than dorsiflexion but continues to be more limited proximal vs distal.  Difficulty with engaging hip muscles and hamstrings in an against-gravity position. Progressing with activities for gaze stabilization vis vestibular rehabilitation techniques.  Pt will end sessions on 11/22 and recommend follow-up with referring MD for further assessment.    OBJECTIVE IMPAIRMENTS Abnormal gait, decreased activity tolerance, decreased balance, decreased coordination, decreased endurance, decreased knowledge of use of DME, decreased mobility, difficulty walking, decreased strength, dizziness, impaired perceived functional ability, impaired flexibility, improper body mechanics, and postural dysfunction.   ACTIVITY LIMITATIONS carrying, lifting, standing, transfers, bed mobility, reach over head, and locomotion level  PARTICIPATION LIMITATIONS: meal prep, cleaning, laundry, community activity, and yard work  PERSONAL FACTORS Age, Time since onset of injury/illness/exacerbation, and 1-2 comorbidities: neurological condition, hx of CA  are also affecting patient's functional outcome.   REHAB POTENTIAL: Good  CLINICAL DECISION MAKING: Evolving/moderate complexity  EVALUATION COMPLEXITY: Moderate  PLAN: PT FREQUENCY: 1-2x/week  PT DURATION: 6 weeks  PLANNED INTERVENTIONS: Therapeutic exercises,  Therapeutic activity, Neuromuscular re-education, Balance training, Gait training, Patient/Family education, Self Care, Joint mobilization, Joint manipulation, Stair training, Vestibular training, Canalith repositioning, Orthotic/Fit training, DME instructions, Aquatic Therapy, Dry Needling, Electrical stimulation, Wheelchair mobility training, Spinal mobilization, Cryotherapy, Moist heat, Splintting, Taping, Ultrasound, Biofeedback, and Manual therapy  PLAN FOR NEXT SESSION:  Functional training with head motion, sit<>stand, and loading to tolerance; re--tests positions?   12:42 PM, 04/14/22 M. Sherlyn Lees, PT, DPT Physical Therapist- [place] Office Number: [phone]   [place] [facility] [address], [place], [place] [address] Phone # [phone] Fax # [phone]

## 2022-04-18 ENCOUNTER — Ambulatory Visit: Payer: Medicare Other

## 2022-04-18 DIAGNOSIS — M6281 Muscle weakness (generalized): Secondary | ICD-10-CM

## 2022-04-18 DIAGNOSIS — R2689 Other abnormalities of gait and mobility: Secondary | ICD-10-CM

## 2022-04-18 DIAGNOSIS — R2681 Unsteadiness on feet: Secondary | ICD-10-CM

## 2022-04-18 DIAGNOSIS — R42 Dizziness and giddiness: Secondary | ICD-10-CM

## 2022-04-18 DIAGNOSIS — R29898 Other symptoms and signs involving the musculoskeletal system: Secondary | ICD-10-CM

## 2022-04-18 NOTE — Therapy (Signed)
OUTPATIENT PHYSICAL THERAPY NEURO TREATMENT    Patient Name: Lawrence Owen MRN: [id number] DOB:19-Aug-1955, 66 y.o., male Today's Date: 04/18/2022   PCP: Marda Stalker REFERRING PROVIDER: Alda Berthold, DO        PT End of Session - 04/18/22 1105     Visit Number 21    Number of Visits 24    Date for PT Re-Evaluation 04/20/22    Authorization Type Medicare/Medicaid    Progress Note Due on Visit 41    PT Start Time 1100    PT Stop Time 1145    PT Time Calculation (min) 45 min    Equipment Utilized During Treatment Gait belt    Activity Tolerance Patient tolerated treatment well    Behavior During Therapy WFL for tasks assessed/performed               Past Medical History:  Diagnosis Date   Anxiety    Cancer of tongue ([place])    neck- lymp  nodes right; treated radiation   COPD (chronic obstructive pulmonary disease) ([place])    Hypertension    Hypothyroidism    OCD (obsessive compulsive disorder)    PTSD (post-traumatic stress disorder)    Stroke ([place])    "mini stoke"    Tingling of right upper extremity    Past Surgical History:  Procedure Laterality Date   NECK SURGERY  2003   lymph node removed   TONSILLECTOMY     TRANSCAROTID ARTERY REVASCULARIZATION  Right 02/21/2019   Procedure: TRANSCAROTID ARTERY REVASCULARIZATION;  Surgeon: Marty Heck, MD;  Location: [facility];  Service: Vascular;  Laterality: Right;   Patient Active Problem List   Diagnosis Date Noted   Carotid artery stenosis 02/21/2019   Carotid stenosis 01/15/2019   Stroke (cerebrum) ([place]) 03/04/2018   Alcohol use 03/04/2018   Essential hypertension 03/04/2018   Bilateral carotid artery disease ([place]) 03/04/2018   Essential hypertension, benign 03/12/2010   ALLERGIC RHINITIS 03/12/2010   HEADACHE 03/12/2010   CONTUSION, HEAD 01/27/2009   HYPERCHOLESTEROLEMIA 10/23/2008   LOW BACK PAIN SYNDROME 10/23/2008   GROIN PAIN 10/23/2008   CAROTID BRUIT, LEFT 09/18/2008   PERIODONTAL  DISEASE 01/03/2008   DENTAL CARIES 03/13/2007   Hypothyroidism 02/16/2007   DISORDER, PERSISTING AMNESTIC, DRUG-INDUCED 02/16/2007   DEPRESSION, MAJOR 02/16/2007   Pulmonary emphysema ([place]) 02/16/2007   CARCINOMA, SQUAMOUS CELL 05/30/2001    ONSET DATE: since 2021  REFERRING DIAG: R29.898 (ICD-10-CM) - Weakness of both lower extremities G95.9 (ICD-10-CM) - Myelopathy ([place])   THERAPY DIAG:  Muscle weakness (generalized)  Other abnormalities of gait and mobility  Other symptoms and signs involving the musculoskeletal system  Unsteadiness on feet  Dizziness and giddiness  Rationale for Evaluation and Treatment Rehabilitation  SUBJECTIVE:  SUBJECTIVE STATEMENT: Feeling pretty weak  Pt accompanied by: self  PERTINENT HISTORY: History of present illness: Starting ~2021, he began having difficulty walking, stating that he could not get his legs to move.  He feels that there is a disconnect between his brain and legs.  He tried using a walker but was falling frequently and has been predominately wheelchair-bound for the past few months.   For the past 6 weeks, he started having fatigue in the arms.  No muscle spasms or twitches  Myelopathy - Ddx: radiation-induced myelopathy vs primary lateral sclerosis. Evaluated at [facility] who favored radiation-induced myelopathy given that he also has sensory complaints in the upper extremities, in addition to prominent UMN findings   PAIN:  Are you having pain? No, feeling of chronic low back pain, worse when standing and extending, e.g. reaching overhead  PRECAUTIONS: Fall  WEIGHT BEARING RESTRICTIONS No  FALLS: Has patient fallen in last 6 months? No  PATIENT GOALS be able to stand long/safely enough to perform activities like washing  dishes and reaching coffee cups  OBJECTIVE:  TODAY'S TREATMENT: 04/18/22 Activity Comments  Standing at counter Bolster between knees/cabinet for extension x 8 min  Transfer training Set-up assist for various scenarios  Seated VOR, horizontal Near target, near-far  Self-head Impulse 10x left/right  Manual muscle test BLE            TODAY'S TREATMENT: 04/14/22 Activity Comments  Standing unsupported x 3 min Intermittent UE touch on counter while donning/doffing jacket  Transfer training Engaged in various functional transfers to generalize movement strategies and improve stand-pivot vs squat-pivot  Seated ocular movements -gaze evoked nystagmus greater at end-range left > right  Seated VOR  Horizontal: cues for smaller amplitude, increased frequency  Dix-Hallpike No positional nystagmus noted, more with gaze direction  Manual muscle test   HEP review      TODAY'S TREATMENT: 04/13/22 Activity Comments  Vitals at start of session 129/95 mmHg, 99% spO2, 65 bpm  sit<>stand   Standing at counter x3 min  Maintaining conversation and no UE support required   standing at counter to + head turns/nods 2x30" each C/o imbalance; CGA  standing at counter to + alt arm raise x20 Good stability  Standing March x10  Very limited amplitude and B UE use  sidestepping along counter 2x  PT following behind with w/c   STS from w/c 5x  Min A d/t LE fatigue/shaking    PATIENT EDUCATION: Education details: discussed patient's fall and recent sx of dizziness Person educated: Patient Education method: Explanation Education comprehension: verbalized understanding    HOME EXERCISE PROGRAM: Access Code: [email or web address] URL: [email or web address] Date: 03/29/2022 Prepared by: Rudell Cobb  Exercises - Cat Cow to Child's Pose  - 1 x daily - 7 x weekly - 10-20 reps - Prone Knee Flexion  - 1 x daily - 7 x weekly - 1-3 sets - 10 reps - Seated Gaze Stabilization with Head Rotation  - 1 x  daily - 7 x weekly - 5-10 sets - 30 sec rounds hold - Seated Gaze Stabilization with Head Nod  - 1 x daily - 7 x weekly - 5-10 sets - 30 sec hold - Seated Right Head Turns Vestibular Habituation  - 1 x daily - 7 x weekly - 1 sets - 10 reps - Seated Head Nods Vestibular Habituation  - 1 x daily - 7 x weekly - 1 sets - 10 reps  __________________________________________________________________________________________________ From initial eval DIAGNOSTIC FINDINGS:  IMPRESSION:  Asymmetric activity within normal shaped striatum is favored within NORMAL LIMITS. No focal loss of dopamine transport activity to suggest Parkinson's syndrome pathology.  IMPRESSION: 1. Normal thoracic cord. 2. No acute abnormality or significant degenerative changes of the thoracic spine.  IMPRESSION: No acute or reversible finding. 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 Old small vessel infarctions of the thalami, basal ganglia and hemispheric white matter. Tiny old left occipital infarction.  MUSCLE LENGTH: WFL, tight to straight leg raise position with strong passive resistance at 35-50 degrees  DTRs:  Bilateral clonus, 2-4 beat  POSTURE: slumped, sacral sitting--not fixed, able to correct with cues, slight decrease in lumbar lordosis appreciated  LOWER EXTREMITY ROM:    WFL  (Blank rows = not tested)  LOWER EXTREMITY MMT:    MMT Right Eval Left Eval Right 04/14/22 Left 04/14/22  Hip flexion _0 Hip extension      Hip abduction 2- 2- 2- 2-  Hip adduction _1 Hip internal rotation      Hip external rotation      Knee flexion 2+ 3 2+ 2+  Knee extension 3+ 3+ 3+ 3+  Ankle dorsiflexion 2+ _2 Ankle plantarflexion 2+ 2+    Ankle inversion      Ankle eversion      (Blank rows = not tested)  Trunk flexion: 2+/5 UE: 5/5 resisted tests.  Able to perform bodyweight dip with RW (no LE contribution)  BED MOBILITY:  Modified indep for  supine<>sit  TRANSFERS: Performs squat-pivot w/c to EOM with modified indep. Stand-pivot with RW and Supervision  FUNCTIONAL TESTs:  Static standing: Able to maintain unsupported standing 30-45 sec increments  __________________________________________________________________________________________________________________  GOALS: Goals reviewed with patient? Yes  SHORT TERM GOALS: Target date: UPDATED/REVISED: 03/30/22  Patient will be independent in HEP to improve functional outcomes Baseline: Goal status: MET  2.  Patient will be able to tolerate (supported) prone position x 5 min to improve lumbar extension and progress for posterior chain strengthening Baseline: one-pillow support Goal status: MET  3.  Maintain supported standing x 5 min to improve participation in housekeeping and ADL participation Baseline: 1-3 min w/ BUE support on RW; 5+ min at counter with intermittent UE support, able to maintain unsupported standing 80% of the trial Goal status: MET  4. The patient will be independent with HEP for gaze adaptation, habituation, balance, and general mobility.  Baseline: newly initiated  Goals status: IN PROGRESS  5. Maintain unsupported standing x 2 min to improve safety with housekeeping and ADL participation  Baseline: 30 sec; (04/14/22) 2 min intermittent UE on counter when donning/doffing jacket  Goal status: MET   LONG TERM GOALS: Target date: REVISED/UPDATED 04/21/22  Improve trunk flexion strength to 3/5, hip flexion strength 3+/5, and lumbar extension strength 3/5 to facilitate lumbar stability and reduce pain Baseline: 2+/5 flexion; 3/5 lumbar extension Goal status: On-going  2.  Demonstrate ability to stand x 5 min and perform unilateral reaching/overhead 1x15 reps left/right in order to improve participation and safety with washing dishes Baseline:  Goal status: MET  3. Demonstrate unsupported standing x 3 min to improve safety with housekeeping and  ADL participation.  Baseline:  Goal status: MET  4. Report/demonstrate improved standing balance and tolerance to enable washing sink full of dishes.  Baseline: unable due to safety concerns  Goal status: IN PROGRESS   ASSESSMENT:  CLINICAL IMPRESSION: Continues to exhibit gaze-evoked nystagmus with greater intensity/frequency  with left gaze vs right (possibly direction-changing?) Able to demo improved standing balance/tolerance with intermittent UE support mainly when reaching outside BOS. Recommend use of bolster/cushions at home to safely perform in kitchen to provide for extension blocking to enable functional task performance, e.g. wash dishes. Strength testing reveals no significant change in strength other than dorsiflexion but continues to be more limited proximal vs distal.  Difficulty with engaging hip muscles and hamstrings in an against-gravity position. Progressing with activities for gaze stabilization vis vestibular rehabilitation techniques.  Pt will end sessions on 11/22 and recommend follow-up with referring MD for further assessment.    OBJECTIVE IMPAIRMENTS Abnormal gait, decreased activity tolerance, decreased balance, decreased coordination, decreased endurance, decreased knowledge of use of DME, decreased mobility, difficulty walking, decreased strength, dizziness, impaired perceived functional ability, impaired flexibility, improper body mechanics, and postural dysfunction.   ACTIVITY LIMITATIONS carrying, lifting, standing, transfers, bed mobility, reach over head, and locomotion level  PARTICIPATION LIMITATIONS: meal prep, cleaning, laundry, community activity, and yard work  PERSONAL FACTORS Age, Time since onset of injury/illness/exacerbation, and 1-2 comorbidities: neurological condition, hx of CA  are also affecting patient's functional outcome.   REHAB POTENTIAL: Good  CLINICAL DECISION MAKING: Evolving/moderate complexity  EVALUATION COMPLEXITY:  Moderate  PLAN: PT FREQUENCY: 1-2x/week  PT DURATION: 6 weeks  PLANNED INTERVENTIONS: Therapeutic exercises, Therapeutic activity, Neuromuscular re-education, Balance training, Gait training, Patient/Family education, Self Care, Joint mobilization, Joint manipulation, Stair training, Vestibular training, Canalith repositioning, Orthotic/Fit training, DME instructions, Aquatic Therapy, Dry Needling, Electrical stimulation, Wheelchair mobility training, Spinal mobilization, Cryotherapy, Moist heat, Splintting, Taping, Ultrasound, Biofeedback, and Manual therapy  PLAN FOR NEXT SESSION:  Functional training with head motion, sit<>stand, and loading to tolerance; re--tests positions?   11:05 AM, 04/18/22 M. Sherlyn Lees, PT, DPT Physical Therapist- [place] Office Number: [phone]   [place] [facility] [address] [place], [place] [address] Phone # [phone] Fax # [phone]

## 2022-04-20 ENCOUNTER — Ambulatory Visit: Payer: Medicare Other

## 2022-04-20 DIAGNOSIS — M6281 Muscle weakness (generalized): Secondary | ICD-10-CM | POA: Diagnosis not present

## 2022-04-20 DIAGNOSIS — R2689 Other abnormalities of gait and mobility: Secondary | ICD-10-CM

## 2022-04-20 DIAGNOSIS — R42 Dizziness and giddiness: Secondary | ICD-10-CM

## 2022-04-20 DIAGNOSIS — R2681 Unsteadiness on feet: Secondary | ICD-10-CM

## 2022-04-20 DIAGNOSIS — R29898 Other symptoms and signs involving the musculoskeletal system: Secondary | ICD-10-CM

## 2022-04-20 NOTE — Therapy (Signed)
OUTPATIENT PHYSICAL THERAPY NEURO TREATMENT and D/C Summary   Patient Name: Lawrence Owen MRN: [id number] DOB:09-28-1955, 66 y.o., male Today's Date: 04/20/2022   PCP: Marda Stalker REFERRING PROVIDER: Alda Berthold, DO   PHYSICAL THERAPY DISCHARGE SUMMARY  Visits from Start of Care: 22  Current functional level related to goals / functional outcomes: See below   Remaining deficits: Ocular mobility and gaze instability    Education / Equipment: HEP   Patient agrees to discharge. Patient goals were partially met. Patient is being discharged due to maximized rehab potential.        PT End of Session - 04/20/22 0934     Visit Number 22    Number of Visits 24    Date for PT Re-Evaluation 04/20/22    Authorization Type Medicare/Medicaid    Progress Note Due on Visit 39    PT Start Time 0930    PT Stop Time 1015    PT Time Calculation (min) 45 min    Equipment Utilized During Treatment Gait belt    Activity Tolerance Patient tolerated treatment well    Behavior During Therapy WFL for tasks assessed/performed               Past Medical History:  Diagnosis Date   Anxiety    Cancer of tongue ([place])    neck- lymp  nodes right; treated radiation   COPD (chronic obstructive pulmonary disease) ([place])    Hypertension    Hypothyroidism    OCD (obsessive compulsive disorder)    PTSD (post-traumatic stress disorder)    Stroke ([place])    "mini stoke"    Tingling of right upper extremity    Past Surgical History:  Procedure Laterality Date   NECK SURGERY  2003   lymph node removed   TONSILLECTOMY     TRANSCAROTID ARTERY REVASCULARIZATION  Right 02/21/2019   Procedure: TRANSCAROTID ARTERY REVASCULARIZATION;  Surgeon: Marty Heck, MD;  Location: [facility];  Service: Vascular;  Laterality: Right;   Patient Active Problem List   Diagnosis Date Noted   Carotid artery stenosis 02/21/2019   Carotid stenosis 01/15/2019   Stroke (cerebrum) ([place]) 03/04/2018    Alcohol use 03/04/2018   Essential hypertension 03/04/2018   Bilateral carotid artery disease ([place]) 03/04/2018   Essential hypertension, benign 03/12/2010   ALLERGIC RHINITIS 03/12/2010   HEADACHE 03/12/2010   CONTUSION, HEAD 01/27/2009   HYPERCHOLESTEROLEMIA 10/23/2008   LOW BACK PAIN SYNDROME 10/23/2008   GROIN PAIN 10/23/2008   CAROTID BRUIT, LEFT 09/18/2008   PERIODONTAL DISEASE 01/03/2008   DENTAL CARIES 03/13/2007   Hypothyroidism 02/16/2007   DISORDER, PERSISTING AMNESTIC, DRUG-INDUCED 02/16/2007   DEPRESSION, MAJOR 02/16/2007   Pulmonary emphysema ([place]) 02/16/2007   CARCINOMA, SQUAMOUS CELL 05/30/2001    ONSET DATE: since 2021  REFERRING DIAG: R29.898 (ICD-10-CM) - Weakness of both lower extremities G95.9 (ICD-10-CM) - Myelopathy ([facility])   THERAPY DIAG:  Muscle weakness (generalized)  Other abnormalities of gait and mobility  Other symptoms and signs involving the musculoskeletal system  Unsteadiness on feet  Dizziness and giddiness  Rationale for Evaluation and Treatment Rehabilitation  SUBJECTIVE:  SUBJECTIVE STATEMENT: Feeling pretty weak  Pt accompanied by: self  PERTINENT HISTORY: History of present illness: Starting ~2021, he began having difficulty walking, stating that he could not get his legs to move.  He feels that there is a disconnect between his brain and legs.  He tried using a walker but was falling frequently and has been predominately wheelchair-bound for the past few months.   For the past 6 weeks, he started having fatigue in the arms.  No muscle spasms or twitches  Myelopathy - Ddx: radiation-induced myelopathy vs primary lateral sclerosis. Evaluated at [facility] who favored radiation-induced myelopathy given that he also has sensory complaints  in the upper extremities, in addition to prominent UMN findings   PAIN:  Are you having pain? No, feeling of chronic low back pain, worse when standing and extending, e.g. reaching overhead  PRECAUTIONS: Fall  WEIGHT BEARING RESTRICTIONS No  FALLS: Has patient fallen in last 6 months? No  PATIENT GOALS be able to stand long/safely enough to perform activities like washing dishes and reaching coffee cups  OBJECTIVE:  TODAY'S TREATMENT: 04/20/22 Activity Comments  Manual muscle test    Saccades: nystagmus at end-range. Smooth pursuits: direction changing nystagmus horizontal. Convergence WNL   Seated VOR x 1 Horizontal/vertical  VOR cancellation WNL  VOR x 2 horizontal/vertical Initial difficulty with coordination, improved with practice       TODAY'S TREATMENT: 04/18/22 Activity Comments  Standing at counter Bolster between knees/cabinet for extension x 8 min  Transfer training Set-up assist for various scenarios  Seated VOR, horizontal Near target, near-far  Self-head Impulse 10x left/right  Manual muscle test BLE              PATIENT EDUCATION: Education details: discussed patient's fall and recent sx of dizziness Person educated: Patient Education method: Explanation Education comprehension: verbalized understanding    HOME EXERCISE PROGRAM: Access Code: [email or web address] URL: [email or web address] Date: 03/29/2022 Prepared by: Rudell Cobb  Exercises - Cat Cow to Child's Pose  - 1 x daily - 7 x weekly - 10-20 reps - Prone Knee Flexion  - 1 x daily - 7 x weekly - 1-3 sets - 10 reps - Seated Gaze Stabilization with Head Rotation  - 1 x daily - 7 x weekly - 5-10 sets - 30 sec rounds hold - Seated Gaze Stabilization with Head Nod  - 1 x daily - 7 x weekly - 5-10 sets - 30 sec hold - Seated Right Head Turns Vestibular Habituation  - 1 x daily - 7 x weekly - 1 sets - 10 reps - Seated Head Nods Vestibular Habituation  - 1 x daily - 7 x weekly - 1 sets -  10 reps  __________________________________________________________________________________________________ From initial eval DIAGNOSTIC FINDINGS:  IMPRESSION: Asymmetric activity within normal shaped striatum is favored within NORMAL LIMITS. No focal loss of dopamine transport activity to suggest Parkinson's syndrome pathology.  IMPRESSION: 1. Normal thoracic cord. 2. No acute abnormality or significant degenerative changes of the thoracic spine.  IMPRESSION: No acute or reversible finding. 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 Old small vessel infarctions of the thalami, basal ganglia and hemispheric white matter. Tiny old left occipital infarction.  MUSCLE LENGTH: WFL, tight to straight leg raise position with strong passive resistance at 35-50 degrees  DTRs:  Bilateral clonus, 2-4 beat  POSTURE: slumped, sacral sitting--not fixed, able to correct with cues, slight decrease in lumbar lordosis appreciated  LOWER EXTREMITY ROM:    WFL  (Blank  rows = not tested)  LOWER EXTREMITY MMT:    MMT Right Eval Left Eval Right 04/14/22 Left 04/14/22  Hip flexion _0 Hip extension      Hip abduction 2- 2- 2- 2-  Hip adduction _1 Hip internal rotation      Hip external rotation      Knee flexion 2+ 3 2+ 2+  Knee extension 3+ 3+ 3+ 3+  Ankle dorsiflexion 2+ _2 Ankle plantarflexion 2+ 2+    Ankle inversion      Ankle eversion      (Blank rows = not tested)  Trunk flexion: 2+/5 UE: 5/5 resisted tests.  Able to perform bodyweight dip with RW (no LE contribution)  BED MOBILITY:  Modified indep for supine<>sit  TRANSFERS: Performs squat-pivot w/c to EOM with modified indep. Stand-pivot with RW and Supervision  FUNCTIONAL TESTs:  Static standing: Able to maintain unsupported standing 30-45 sec increments  __________________________________________________________________________________________________________________  GOALS: Goals  reviewed with patient? Yes  SHORT TERM GOALS: Target date: UPDATED/REVISED: 03/30/22  Patient will be independent in HEP to improve functional outcomes Baseline: Goal status: MET  2.  Patient will be able to tolerate (supported) prone position x 5 min to improve lumbar extension and progress for posterior chain strengthening Baseline: one-pillow support Goal status: MET  3.  Maintain supported standing x 5 min to improve participation in housekeeping and ADL participation Baseline: 1-3 min w/ BUE support on RW; 5+ min at counter with intermittent UE support, able to maintain unsupported standing 80% of the trial Goal status: MET  4. The patient will be independent with HEP for gaze adaptation, habituation, balance, and general mobility.  Baseline: newly initiated  Goals status: MET  5. Maintain unsupported standing x 2 min to improve safety with housekeeping and ADL participation  Baseline: 30 sec; (04/14/22) 2 min intermittent UE on counter when donning/doffing jacket  Goal status: MET   LONG TERM GOALS: Target date: REVISED/UPDATED 04/21/22  Improve trunk flexion strength to 3/5, hip flexion strength 3+/5, and lumbar extension strength 3/5 to facilitate lumbar stability and reduce pain Baseline: 2+/5 flexion; 3/5 lumbar extension Goal status: NOT MET  2.  Demonstrate ability to stand x 5 min and perform unilateral reaching/overhead 1x15 reps left/right in order to improve participation and safety with washing dishes Baseline:  Goal status: MET  3. Demonstrate unsupported standing x 3 min to improve safety with housekeeping and ADL participation.  Baseline:  Goal status: MET  4. Report/demonstrate improved standing balance and tolerance to enable washing sink full of dishes.  Baseline: unable due to safety concerns  Goal status: NOT MET   ASSESSMENT:  CLINICAL IMPRESSION: Review of POC details and demonstrates continued gaze-evoked nystagmus and will f/u with neuro MD  regarding symptoms. Initiated VOR x 2 with improved success with practice. Continues to exhibit generalized weakness and spasticity present in hip adductors, 3-4 beat ankle clonus. Tx plan has focused on improving standing balance and tolerance as well as incoporating VOR activities due to gaze instability. Pt has likely met max potential at this time and demo independence with HEP activities and will D/C to home program. Recommend f/u with referring provider   OBJECTIVE IMPAIRMENTS Abnormal gait, decreased activity tolerance, decreased balance, decreased coordination, decreased endurance, decreased knowledge of use of DME, decreased mobility, difficulty walking, decreased strength, dizziness, impaired perceived functional ability, impaired flexibility, improper body mechanics, and postural dysfunction.   ACTIVITY LIMITATIONS carrying, lifting, standing,  transfers, bed mobility, reach over head, and locomotion level  PARTICIPATION LIMITATIONS: meal prep, cleaning, laundry, community activity, and yard work  [place] Age, Time since onset of injury/illness/exacerbation, and 1-2 comorbidities: neurological condition, hx of CA  are also affecting patient's functional outcome.   REHAB POTENTIAL: Good  CLINICAL DECISION MAKING: Evolving/moderate complexity  EVALUATION COMPLEXITY: Moderate  PLAN: PT FREQUENCY: 1-2x/week  PT DURATION: 6 weeks  PLANNED INTERVENTIONS: Therapeutic exercises, Therapeutic activity, Neuromuscular re-education, Balance training, Gait training, Patient/Family education, Self Care, Joint mobilization, Joint manipulation, Stair training, Vestibular training, Canalith repositioning, Orthotic/Fit training, DME instructions, Aquatic Therapy, Dry Needling, Electrical stimulation, Wheelchair mobility training, Spinal mobilization, Cryotherapy, Moist heat, Splintting, Taping, Ultrasound, Biofeedback, and Manual therapy  PLAN FOR NEXT SESSION:  D/C to HEP    9:34 AM,  04/20/22 M. Sherlyn Lees, PT, DPT Physical Therapist- [place] Office Number: [phone]   [place] at [facility] [address], [place], [place] [address] Phone # [phone] Fax # [phone]

## 2022-07-25 ENCOUNTER — Encounter: Payer: Self-pay | Admitting: Neurology

## 2022-07-25 ENCOUNTER — Ambulatory Visit (INDEPENDENT_AMBULATORY_CARE_PROVIDER_SITE_OTHER): Payer: Medicare Other | Admitting: Neurology

## 2022-07-25 VITALS — BP 146/76 | HR 60 | Ht 70.0 in | Wt 142.9 lb

## 2022-07-25 DIAGNOSIS — R29898 Other symptoms and signs involving the musculoskeletal system: Secondary | ICD-10-CM | POA: Diagnosis not present

## 2022-07-25 DIAGNOSIS — G959 Disease of spinal cord, unspecified: Secondary | ICD-10-CM | POA: Diagnosis not present

## 2022-07-25 NOTE — Progress Notes (Signed)
Follow-up Visit   Date: 07/25/2022    OLEG GOGGANS MRN: [id number] DOB: 1955-06-04    Lawrence Owen is a 67 y.o. right-handed male with COPD, cancer of the tongue, hypertension, hypothyroidism, OCD, and PTSD returning to the clinic for follow-up of progressive weakness of the lower >> upper extremities, concerning for radiation-induced myelopathy vs PLS.  The patient was accompanied to the clinic by partner who also provides collateral information.    IMPRESSION/PLAN: Radiation-induced myelopathy bilateral leg paraparesis.  Less likely PLS.  Evaluation at [facility] favored radiation-induced myelopathy given that he also has sensory complaints in the upper extremities, in addition to prominent UMN findings.  Clinically, there has been no new neurological symptoms and exam is stable.  He has completed PT.  Management remains supportive.  Return to clinic in 8 months  --------------------------------------------- History of present illness: Starting ~2021, he began having difficulty walking, stating that he could not get his legs to move.  He feels that there is a disconnect between his brain and legs.  He tried using a walker but was falling frequently and has been predominately wheelchair-bound for the past few months.   For the past 6 weeks, he started having fatigue in the arms.  No muscle spasms or twitches. He has been extensively evaluated by my colleague, Dr. Metta Clines who has checked MRI neuroaxis, NCS/EMG of the legs, DAT scan, and serology testing (CK, aldolase, Lyme, Copper, zinc, PTH, GM1, HTLV, SPEP with IFE, B12). All which has been unremarkable.  There was a global pattern of incomplete motor unit activation on EMG concerning for central disorder of motor unit control.  Specifically, no active or chronic motor axon loss.  His exam has shows diffuse hyperreflexia and weakness in the legs. He has some difficulty with swallowing which has been present since his  tongue cancer.    UPDATE 01/18/2022:  He was evaluated by neuromuscular center at [facility] who reviewed his work-up and raised concern that his symptoms could be secondary to radiation-induced myelopathy. He denies any new weakness of the arms or leg.  No speech/swallow difficulty, muscle twitches, or cramps. He remains non-ambulatory. He enjoys being outdoors and is inquiring about getting an all-terrain power chair.    He has noticed new lightheadedness which is triggered by positional changes.  He wears a condom catheter when he is outside of the home and has noticed occasional blood in urine.   UPDATE 07/25/2022:  He is here for follow-up visit.  There has been no significant change since he was last here.  He had one fall in the past 6 months. He has noticed that his hands feel strange and he can drop things.  He also is concerned about the wrinkles on his fingerpads.  No worsening leg weakness.    Medications:  Current Outpatient Medications on File Prior to Visit  Medication Sig Dispense Refill   cholecalciferol (VITAMIN D3) 25 MCG (1000 UNIT) tablet Take 1,000 Units by mouth daily.     Cyanocobalamin (VITAMIN B-12) 5000 MCG TBDP Take 5,000 mcg by mouth daily.      levothyroxine (SYNTHROID, LEVOTHROID) 100 MCG tablet Take 100 mcg by mouth daily before breakfast.     Multiple Vitamin (MULTIVITAMIN WITH MINERALS) TABS tablet Take 1 tablet by mouth daily.     Multiple Vitamins-Minerals (EMERGEN-C IMMUNE PO) Take by mouth.     Polyethyl Glycol-Propyl Glycol (LUBRICANT EYE DROPS) 0.4-0.3 % SOLN Place 1 drop into both eyes 3 (  three) times daily as needed (dry/irritated eyes.).     No current facility-administered medications on file prior to visit.    Allergies: No Known Allergies  Vital Signs:  BP (!) 146/76   Pulse 60   Ht '5\' 10"'$  (1.778 m)   Wt 142 lb 14.4 oz (64.8 kg)   SpO2 100%   BMI 20.50 kg/m   Neurological Exam: MENTAL STATUS including orientation to time, place, person,  recent and remote memory, attention span and concentration, language, and fund of knowledge is normal.  Speech is not dysarthric.  CRANIAL NERVES:  No visual field defects.  Pupils equal round and reactive to light.  Normal conjugate, extra-ocular eye movements in all directions of gaze. Lateral gaze torsional nystagmus bilaterally. No ptosis.  Face is symmetric. Palate elevates symmetrically.  Tongue is midline, no weakness or fasciculations.  MOTOR:  Bilateral leg atrophy, fasciculations or abnormal movements.  No pronator drift.   Upper Extremity:  Right  Left  Deltoid  5/5   5/5   Biceps  5/5   5/5   Triceps  5/5   5/5   Infraspinatus 5/5  5/5  Medial pectoralis 5/5  5/5  Wrist extensors  5/5   5/5   Wrist flexors  5/5   5/5   Finger extensors  5/5   5/5   Finger flexors  5/5   5/5   Dorsal interossei  5/5   5/5   Abductor pollicis  5/5   5/5   Tone (Ashworth scale)  0  0   Lower Extremity:  Right  Left  Hip flexors  3/5   3/5   Knee flexors  4/5   4/5   Knee extensors  4/5   4/5   Dorsiflexors  3/5   3/5   Plantarflexors  3/5   3/5   Tone (Ashworth scale)  0  0   MSRs:                                           Right        Left brachioradialis 2+  2+  biceps 2+  2+  triceps 2+  2+  patellar 3+  3+  ankle jerk 3+  3+  Hoffman no  no  plantar response up  up   SENSORY:  Intact to vibration throughout.  COORDINATION/GAIT:  Nonambulatory, here in wheelchair  Data: NCS/EMG of the legs 02/17/2021: There is no evidence of a sensorimotor polyneuropathy, lumbosacral radiculopathy, motor neuron disease, or myopathy affecting the lower extremities.     Of note, testing was somewhat hampered by a global pattern of incomplete motor unit recruitment as seen by variable activation, which may be due to pain, poor effort, or central disorder of motor unit control.   MRI cervical spine 01/25/2021: 1. Multilevel degenerative changes of the cervical spine detailed above, most advanced  at C5-C6 where there is moderate left worse than right neural foraminal stenosis and at C6-C7 where there is mild-to-moderate right and moderate left neural foraminal stenosis. 2. No significant spinal canal stenosis, and no evidence of cord signal abnormality.   MRI lumbar spine wwo contrast 01/15/2021: 1. At L5-S1 there is a broad-based disc bulge with a small central disc protrusion. Mild bilateral facet arthropathy. No foraminal stenosis. No central canal stenosis. 2.  No acute osseous injury of the lumbar spine.   MRI brain  wo contrast 02/07/2021: No acute or reversible finding. 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 Old small vessel infarctions of the thalami, basal ganglia and hemispheric white matter. Tiny old left occipital infarction.   MRI thoracic spine wo contrast 911/2022: 1. Normal thoracic cord. 2. No acute abnormality or significant degenerative changes of the thoracic spine.   DAT scan 03/29/2021: Asymmetric activity within normal shaped striatum is favored within NORMAL LIMITS. No focal loss of dopamine transport activity to suggest Parkinson's syndrome pathology.   Of note, DaTSCAN is not diagnostic of Parkinsonian syndromes, which remains a clinical diagnosis. DaTscan is an adjuvant test to aid in the clinical diagnosis of Parkinsonian syndromes.    Thank you for allowing me to participate in patient's care.  If I can answer any additional questions, I would be pleased to do so.    Sincerely,    Elisabetta Mishra K. Posey Pronto, DO

## 2022-07-25 NOTE — Patient Instructions (Signed)
Return to clinic in 8 months

## 2023-03-20 ENCOUNTER — Telehealth (INDEPENDENT_AMBULATORY_CARE_PROVIDER_SITE_OTHER): Payer: Medicare Other | Admitting: Neurology

## 2023-03-20 ENCOUNTER — Encounter: Payer: Self-pay | Admitting: Neurology

## 2023-03-20 VITALS — Ht 70.0 in | Wt 135.0 lb

## 2023-03-20 DIAGNOSIS — G959 Disease of spinal cord, unspecified: Secondary | ICD-10-CM | POA: Diagnosis not present

## 2023-03-20 DIAGNOSIS — R29898 Other symptoms and signs involving the musculoskeletal system: Secondary | ICD-10-CM

## 2023-03-20 NOTE — Progress Notes (Signed)
   Virtual Visit via Video Note The purpose of this virtual visit is to provide medical care while limiting exposure to the novel coronavirus.    Consent was obtained for video visit:  Yes.   Answered questions that patient had about telehealth interaction:  Yes.   I discussed the limitations, risks, security and privacy concerns of performing an evaluation and management service by telemedicine. I also discussed with the patient that there may be a patient responsible charge related to this service. The patient expressed understanding and agreed to proceed.  Pt location: Home Physician Location: office Name of referring provider:  Jarrett Soho, PA-C I connected with Lawrence Owen at patients initiation/request on 03/20/2023 at 10:50 AM EDT by video enabled telemedicine application and verified that I am speaking with the correct person using two identifiers. Pt MRN:  [id number] Pt DOB:  01/17/1956 Video Participants:  Lawrence Owen;  partner   History of Present Illness: This is a 67 y.o. male returning for follow-up of radiation-induced myelopathy.  He reports having mild progression of bilateral leg weakness and is requesting to have power wheel chair.  He currently has one which was purchased online but it is too bulky and hard to transport around the home.  He was seen at [facility] in the summer and recommended to take vitamin E.  He has been doing this, but has not noticed any benefit. He denies difficulty with speech/swallow.  No new numbness/tingling, he continues to have paresthesias in the hands.    Observations/Objective:   Vitals:   03/20/23 1051  Weight: 135 lb (61.2 kg)  Height: 5\' 10"  (1.778 m)   Patient is awake, alert, and appears comfortable. Laying in the bed.  Cachetic appearing.  Extraocular muscles are intact. No ptosis.  Face is symmetric.  Speech is not dysarthric.  Antigravity in the arms.  He is unable to raise the legs off the bed.   Nonambulatory  Assessment and Plan:  Radiation-induced myelopathy manifesting with bilateral leg paraparesis.   - Referral will be sent for home OT for powerchair assessment  - Continue supportive care  Follow Up Instructions:   I discussed the assessment and treatment plan with the patient. The patient was provided an opportunity to ask questions and all were answered. The patient agreed with the plan and demonstrated an understanding of the instructions.   The patient was advised to call back or seek an in-person evaluation if the symptoms worsen or if the condition fails to improve as anticipated.  Follow-up in 6 months   Glendale Chard, DO

## 2023-07-16 IMAGING — NM NM DATSCAN
2 series · 12 of 12 positions shown · non-contrast
Comparison: Brain MRI 02/04/2021

CLINICAL DATA: 65-year-old male. Partial lower extremity weakness.
Gait disturbance.

EXAM:
NUCLEAR MEDICINE BRAIN IMAGING WITH SPECT  (DaTscan )
TECHNIQUE: SPECT images of the brain were obtained after intravenous injection
of radiopharmaceutical. 4 hour post injection imaging. Appropriate
positioning.
130 mg NETTE STAT given orally for thyroid blockade.
RADIOPHARMACEUTICALS:  4.7 millicuries I 123 Ioflupane

[Series 1: dat scan · 4.14mm/px · 6 of 120 frames shown]
[frame 11/120  full-range]
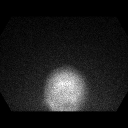
[frame 31/120  full-range]
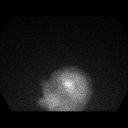
[frame 51/120  full-range]
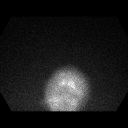
[frame 71/120  full-range]
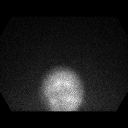
[frame 91/120  full-range]
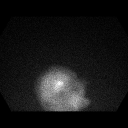
[frame 111/120  full-range]
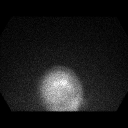

[Series 7: datquant results · 4.1mm · 4.14mm/px · 6 of 128 frames shown]
[frame 11/128]
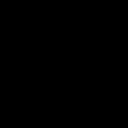
[frame 32/128]
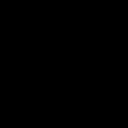
[frame 54/128]
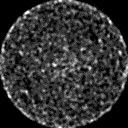
[frame 75/128]
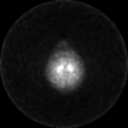
[frame 96/128]
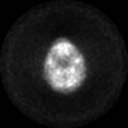
[frame 118/128]
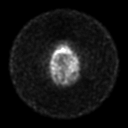

[12 of 12 positions shown; findings below may reference images not displayed]

FINDINGS: Normal shape of LEFT and RIGHT striatum. No loss of activity in the
heads of the caudate nuclei or putamen. There is mild asymmetric
activity with the LEFT striatum more intense than the RIGHT.
IMPRESSION: Asymmetric activity within normal shaped striatum is favored within
NORMAL LIMITS. No focal loss of dopamine transport activity to
suggest Parkinson's syndrome pathology.

Of note, DaTSCAN is not diagnostic of Parkinsonian syndromes, which
remains a clinical diagnosis. DaTscan is an adjuvant test to aid in
the clinical diagnosis of Parkinsonian syndromes.

## 2023-08-15 ENCOUNTER — Ambulatory Visit: Payer: Medicare Other | Admitting: Physical Therapy

## 2023-09-12 ENCOUNTER — Ambulatory Visit: Attending: Family Medicine | Admitting: Physical Therapy

## 2023-09-12 ENCOUNTER — Encounter: Payer: Self-pay | Admitting: Physical Therapy

## 2023-09-12 DIAGNOSIS — M6281 Muscle weakness (generalized): Secondary | ICD-10-CM | POA: Insufficient documentation

## 2023-09-12 NOTE — Therapy (Signed)
 OUTPATIENT PHYSICAL THERAPY WHEELCHAIR EVALUATION   Patient Name: Lawrence Owen MRN: [id number] DOB:November 15, 1955, 68 y.o., male Today's Date: 09/20/2023  END OF SESSION:   09/12/23 1259  PT Visits / Re-Eval  Visit Number 1  Number of Visits 1  Date for PT Re-Evaluation 09/12/23  Authorization  Authorization Type Medicare  PT Time Calculation  PT Start Time 1250  PT Stop Time 1340  PT Time Calculation (min) 50 min  PT - End of Session  Equipment Utilized During Treatment Other (comment) (mat level)  Activity Tolerance Patient tolerated treatment well  Behavior During Therapy WFL for tasks assessed/performed     Past Medical History:  Diagnosis Date   Anxiety    Cancer of tongue (HCC)    neck- lymp  nodes right; treated radiation   COPD (chronic obstructive pulmonary disease) (HCC)    Hypertension    Hypothyroidism    OCD (obsessive compulsive disorder)    PTSD (post-traumatic stress disorder)    Stroke (HCC)    "mini stoke"    Tingling of right upper extremity    Past Surgical History:  Procedure Laterality Date   NECK SURGERY  2003   lymph node removed   TONSILLECTOMY     TRANSCAROTID ARTERY REVASCULARIZATION  Right 02/21/2019   Procedure: TRANSCAROTID ARTERY REVASCULARIZATION;  Surgeon: Young Hensen, MD;  Location: [facility] OR;  Service: Vascular;  Laterality: Right;   Patient Active Problem List   Diagnosis Date Noted   Carotid artery stenosis 02/21/2019   Carotid stenosis 01/15/2019   Stroke (cerebrum) (HCC) 03/04/2018   Alcohol use 03/04/2018   Essential hypertension 03/04/2018   Bilateral carotid artery disease (HCC) 03/04/2018   Essential hypertension, benign 03/12/2010   Allergic rhinitis 03/12/2010   Headache 03/12/2010   Contusion of scalp, face, and neck, excluding eyes 01/27/2009   HYPERCHOLESTEROLEMIA 10/23/2008   LOW BACK PAIN SYNDROME 10/23/2008   GROIN PAIN 10/23/2008   CAROTID BRUIT, LEFT 09/18/2008   Gingival and periodontal  disease 01/03/2008   Dental caries 03/13/2007   Hypothyroidism 02/16/2007   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 Patient arrives  in manual wheelchair with spouse. Patient reports onset of progressive symptoms around 2019 that by 2020 was primarily requiring a wheelchair for mobility and per chart review was non-ambulatory by 2022. Per progressive symptoms, patient has been worked up for [facility] versus radiation induced, inoperable cervical myleopathy, the later of which is the primary suspected diagnosis. At this time weakness greater in LE than UE though patient reports fatigue in both uppper and LE as well as sensation changes in both. Patient reports having a basic group 2 chair that him and his spouse self-purchased second-hand many years ago that is not meeting current needs given progression of symptoms. Given lack of support and comfort of current chair patient spending numerous hours in bed to allow him to stretch out leg and have increased support as current chair does not have these features to support his fatigue levels throughout the day.   PRECAUTIONS: Fall  RED FLAGS: Bowel or bladder incontinence: Yes: uses intermittent cath    WEIGHT BEARING RESTRICTIONS No   OCCUPATION: Retired [employer]   PLOF:  Requires assistance with most ADLs from spouse   PATIENT GOALS: "Get a better chair."    MEDICAL HISTORY:  Primary diagnosis  onset: 05/23/2023 (referral date)      Medical Diagnosis with ICD-10 code: G12.29 (ICD-10-CM) - Upper motor neuron disease (HCC)   [x] Progressive disease - [facility] suspected radiation induced myelopathy, inoperable vs PLS though first diagnosis is more probably suspected at this time given sensory/bladder changes per chart review Relevant future surgeries:   None at this time   Height: 5\' 10"  Weight: 135 lbs Explain recent changes or trends in weight:  reports to have lost considerable weight since onset of diagnosis, suspected due to muscle wasting    History:  Past Medical History:  Diagnosis Date   Anxiety    Cancer of tongue (HCC)    neck- lymp  nodes right; treated radiation   COPD (chronic obstructive pulmonary disease) (HCC)    Hypertension    Hypothyroidism    OCD (obsessive compulsive disorder)    PTSD (post-traumatic stress disorder)    Stroke (HCC)    "mini stoke"    Tingling of right upper extremity        Cardio Status:  Functional Limitations: hypertension, reports rapid fatigue with exertion   [] Intact  [x]  Impaired      Respiratory Status:  Functional Limitations:   [] Intact  [x] Impaired   [x] SOB [x] COPD [] O2 Dependent ______LPM  [] Ventilator Dependent   Orthotics:   [] Amputee:                                                             [] Prosthesis:        HOME ENVIRONMENT:  [x] House [] Condo/town home [] Apartment [] Asst living [] LTCF         [x] Own  [] Rent   [] Lives alone [x] Lives with others -   Spouse                          Hours without assistance: No more 4 hours a day  [x] Home is accessible to patient  - two story but set up on first floor for all living with ramps, mix of hardwood and carpet on first floor  Storage of wheelchair:  [x] In home   [] Other Comments:        COMMUNITY :  TRANSPORTATION:  [x] Car [] Therapist, occupational [] Adapted w/c Lift []  Ambulance [x] Other:                     [] Sits in wheelchair  during transport   Where is w/c stored during transport? Attachable wheelchair lift  [x] Tie Downs  []  EZ Lock  r   [] Self-Driver       Drive while in  Wheelchair [] yes [x] no   Employment and/or school:    Not applicable, retired        Other: Reports that he likes to go outside of home and to [employer] appointments   COMMUNICATION:  Verbal Communication  [x] WFL [] receptive [] WFL [] expressive [] Understandable  [] Difficult to understand  [] non-communicative  Primary Language:__English____________ 2nd:_____________  Communication provided by:[x] Patient [x] Family - spouse (caregiver) [] Caregiver [] Translator   [] Uses an Paramedic device     Manufacturer/Model :                                                       MOBILITY/BALANCE:  Sitting Balance  Standing Balance  Transfers  Ambulation   [] WFL      [] WFL  [] Independent  []  Independent   [x] Uses UE for balance in sitting Comments: Fatigues   [] Uses UE/device for stability Comments:  [x]  Min assist - reports with fatigue requires minA from caregiver, can only transfer to level height surfaces independently when energy level is good []  Ambulates independently with       device:___________________      []  Mod assist  []  Able to ambulate ______ feet        safely/functionally/independently   []  Min assist  []  Min assist  []  Max assist  []  Non-functional ambulator         History/High risk of falls   []  Mod assist  []  Mod assist  []  Dependent  [x]  Unable to ambulate   []  Max  assist  []  Max assist  Transfer method:[] 1 person [] 2 person [] sliding board [x] squat pivot - poor clearance due to hip weakness, requires transfers to level surfaces or surfaces down hill  [] stand pivot [] mechanical patient lift  [] other:   []  Unable  [x]  Unable    Fall History: # of falls in the past 6 months? None, reports numerous falls before started using wheelchair full time # of "near" falls in the past 6 months? None    CURRENT SEATING /  MOBILITY:  Current Mobility Device: [] None [] Cane/Walker [x] Manual [] Dependent [] Dependent w/ Tilt rScooter  [x] Power (type of control): group 2 chair - [address]: Drive for power, manual unknown Model:  Serial #:   Size:  Color:  Age:   Purchased by whom: self purchased both many years ago, secondhand  Current condition of mobility base:  brakes rusted on manual chair, power chair seat cushion is worn - patient reports it tipping over one time with transfer   Current seating system:  Age of seating system:    Describe posture in present seating system: Slumped posture, does not accommodate leg length in manual chair, knees is adduction, sheet width far too wide for patient further exacerbating reduced proximal stability   Is the current mobility meeting medical necessity?:  [] Yes [x] No Describe: Given UE fatigue and respiratory status, patient frequently requires PT or spouse to propel him after going short distances, power chair does not have adequate features for safe transfers or pressure relief given progression of mobility decline, does not allow leg lifting to manage LE edema or manage spasticity, does not adjust height for safe transfers requiring patient to need more caregiver assistance with transfers fto surfaces that are more elevated                             Ability to complete Mobility-Related Activities of Daily Living (MRADL's) with Current Mobility Device:   Move room to room  [] Independent  [x] Min - current manual chair fatigues out requiring minA from caregiver and with group 2 power chair can operate joystick [] Mod [] Max assist  [] Unable  Comments: Patient can operate self-purchased group 2 power chair but is not appropriate for reasons mentioned above for long term daily use   Meal prep  [] Independent  [] Min [] Mod [x] Max assist - reports being unable to reach to counter  [] Unable    Feeding   [x] Independent  [] Min [] Mod [] Max assist  [] Unable    Bathing  [] Independent  [] Min [] Mod [x] Max assist  [] Unable    Grooming  [] Independent  [x] Min [] Mod [] Max assist  [] Unable    UE dressing  [] Independent  [x] Min [] Mod [] Max assist  [] Unable    LE dressing  [] Independent   [] Min [x] Mod [] Max assist  [] Unable    Toileting  [] Independent  [] Min [x] Mod [] Max assist  [] Unable    Bowel Mgt: [x]  Continent - but reports increased strain and sometimes takes up to an hour to perform bowel movement []  Incontinent []  Accidents []  Diapers []  Colostomy []  Bowel Program:  Bladder Mgt: []  Continent [x]  Incontinent []  Accidents []  Diapers []  Urinal [x]  Intermittent Cath []  Indwelling Cath []  Supra-pubic Cath     Current Mobility Equipment Trialed/ Ruled Out:    Does not meet mobility needs due to:    Mark all boxes that indicate inability to use the specific equipment listed     Meets needs for safe  independent functional  ambulation  / mobility    Risk of  Falling or History of Falls    Enviromental limitations      Cognition    Safety concerns with  physical ability    Decreased / limitations endurance  & strength     Decreased / limitations  motor skills  & coordination    Pain    Pace /  Speed    Cardiac and/or  respiratory condition    Contra - indicated by diagnosis   Cane/Crutches  []   [x]   []   []   [x]   [x]   [x]   [x]   [x]   [x]   [x]    Walker / Rollator  []  NA   []   [x]   []   []   [x]   [x]   [x]   [x]   [x]   [x]   [x]     Manual Wheelchair [id number]:  []  NA  []   []   []   []   [x]   [x]   [x]   [x]   [x]   [x]   []   Manual W/C (K0005) with power assist  []  NA  []   []   []   []   [x]   [x]   [x]   [x]   []   [x]   []    Scooter  []  NA  []   []   [x]   []   [x]   [x]   []   [x]   []   []   []    Power Wheelchair: standard joystick  []  NA  [x]   []   []   []   []   []   []   []   []   []   []    Power Wheelchair: alternative controls  [x]  NA  []   []   []   []   []   []   []   []   []   []   []    Summary:  The least costly  alternative for independent functional mobility was found to be:    []  Crutch/Cane  []  Walker []  Manual w/c  []  Manual w/c with power assist   []  Scooter   [x]  Power w/c std joystick   []  Power w/c alternative control        []  Requires dependent care mobility device   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 for safe mobility equipment operation  [x]   Yes []   No  Patient is willing and motivated to use recommended mobility equipment  [x]   Yes []   No       []  Patient is unable to safely operate mobility equipment independently and requires dependent care equipment Comments:           SENSATION and SKIN ISSUES:  Sensation []  Intact  [x]  Impaired []  Absent []  Hyposensate []  Hypersensate  []  Defensiveness  Location(s) of impairment: Reports numbness in bilateral LE and hands, denies any in trunk at this time   Pressure Relief Method(s):  []  Lean side to side to offload (without risk of falling)  []   W/C push up (4+ times/hour for 15+ seconds) []  Stand up (without risk of falling)    []  Other: (Describe): Effective pressure relief method(s) above can be performed consistently throughout the day: [] Yes  [x]  No If not, Why?: Patient fatigues throughout the course of the day, so when fatigue low can lean side to side fatigues over the course of day and is unable to adequately pressure relieve,   Skin Integrity Risk:       []  Low risk           []  Moderate risk            [x]  High risk  If high risk, explain:  Progressive nature of symptoms making increasingly less mobile, reduction in weight placing at increased risk for skin breakdown, history of poor healing skin in the past   Skin Issues/Skin Integrity  Current skin Issues  [x]  Yes []  No []  Intact  [x]   Red area   []   Open area  [x]  Scar tissue  []  At risk from prolonged sitting  Where: History of Skin Issues  [x]  Yes []  No Where : R clavicle at radiation site, has not healed since ~2020 per  patient  When: ~2020 - ongoing  Stage: unknown Hx of skin flap surgeries  []  Yes [x]  No Where:  When:  Pain: [x]  Yes []  No   Pain Location(s): low back Intensity scale: (0-10) :10/10 briefly when hurts and otherwise reports periods where it does not both him, get sharps twinges with transfers How does pain interfere with mobility and/or MRADLs? - makes transfers more challenging due to sudden and sharp  pain        MAT EVALUATION:  Neuro-Muscular Status: (Tone, Reflexive, Responses, etc.)     []   Intact   [x]  Spasticity:  []  Hypotonicity  []  Fluctuating  []  Muscle Spasms  []  Poor Righting Reactions/Poor Equilibrium Reactions  []  Primal Reflex(s):    Comments:            COMMENTS:    POSTURE:     Comments:  Pelvis Anterior/Posterior:  []  Neutral   [x]  Posterior  []  Anterior  []  Fixed - No movement [x]  Tendency away from neutral [x]  Flexible [x]  Self-correction [x]  External correction with fatigue Obliquity (viewed from front)  [x]  WFL []  R Obliquity []  L Obliquity  []  Fixed - No movement []  Tendency away from neutral []  Flexible []  Self-correction []  External correction Rotation  [x]  WFL []  R anterior []  L anterior  []  Fixed - No movement []  Tendency away from neutral []  Flexible []  Self-correction []  External correction Tonal Influence Pelvis:  []  Normal []  Flaccid []  Low tone [x]  Spasticity []  Dystonia []  Pelvis thrust []  Other:   Trunk Anterior/Posterior:  []  WFL [x]  Thoracic kyphosis []  Lumbar lordosis  []  Fixed - No movement [x]  Tendency away from neutral [x]  Flexible [x]  Self-correction [x]  External correction - requires external correction as fatigues  [x]  WFL []  Convex to left  []  Convex to right []  S-curve   []  C-curve []  Multiple curves []  Tendency away from neutral []  Flexible []  Self-correction []  External correction Rotation of shoulders and upper trunk:  []  Neutral [x]  Left-anterior [x]  Right- anterior []  Fixed-  no movement []  Tendency away from neutral [x]  Flexible [x]  Self correction []  External correction Tonal influence Trunk:  []  Normal []  Flaccid []  Low tone []  Spasticity []  Dystonia [x]  Other: Due to degree of muscle weakness functionally presents like low tone though [facility]  Head & Neck  [x]  Functional []  Flexed    []  Extended []  Rotated right  []  Rotated left []  Laterally flexed right []  Laterally flexed left []  Cervical hyperextension   [x]  Good head control []  Adequate head control []  Limited head control []  Absent head control Describe tone/movement of head and neck:       Lower Extremity Measurements: LE ROM:  Active ROM Right 09/20/2023 Left 09/20/2023  Hip flexion Cannot achieve past 95 degrees seated Cannot achieve past 95 degrees seated  Hip extension    Hip abduction Varies, limited by weakness Varies, limited by weakness   Hip adduction [facility]  Knee flexion 100 degrees 100 degrees  Knee extension Lacking 30 degrees Lacking 20 degrees  Ankle dorsiflexion 5 degrees 5 degrees   Ankle plantarflexion WFL WFL   (Blank rows = not tested)  LE MMT:  MMT Right 09/20/2023 Left 09/20/2023  Hip flexion 2+/5 2+/5  Hip extension    Hip abduction 3-/5 3-/5  Hip adduction 3-/5 3-/5  Knee flexion 2+/5 2+/5  Knee extension 2+/5 2+/5  Ankle dorsiflexion 2+/5 2+/5  Ankle plantarflexion 3-/5 3-/5   (Blank rows = not tested)  *Note: varies with fatigue throughout day per patient and caregiver report, consistent with what seen in clinic for increasing support with edge of mat sitting more than a few minutes; MMT also varies with UE   Hip positions:  [x]  Neutral but fatigue falls into abduction/adduction with fatigue   []  Abducted   []  Adducted  []  Subluxed   []  Dislocated   []  Fixed   [x]  Tendency away from neutral [x]  Flexible [x]   Self-correction [x]  External correction - with fatigue   Hip Windswept:[x]  Neutral  []  Right    []  Left  []  Subluxed   []  Dislocated    []  Fixed   []  Tendency away from neutral []  Flexible []  Self-correction []  External correction  LE Tone: []  Normal []  Low tone [x]  Spasticity []  Flaccid []  Dystonia []  Rocks/Extends at hip []  Thrust into knee extension []  Pushes legs downward into footrest  Foot positioning: ROM Concerns: Dorsiflexed: []  Right   []  Left Plantar flexed: [x]  Right    [x]  Left Inversion: []  Right    []  Left Eversion: []  Right    []  Left  LE Edema: [x]  1+ (Barely detectable impression when finger is pressed into skin) []  2+ (slight indentation. 15 seconds to rebound) []  3+ (deeper indentation. 30 seconds to rebound) []  4+ (>30 seconds to rebound)  UE Measurements:  UPPER EXTREMITY ROM:   Active ROM Right 09/20/2023 Left 09/20/2023  Shoulder flexion 150 150  Shoulder abduction 155 155  Shoulder adduction    Elbow flexion [facility] WFL  Elbow extension [facility] WFL  Wrist flexion    Wrist extension    (Blank rows = not tested)  UPPER EXTREMITY MMT:  MMT Right 09/20/2023 Left 09/20/2023  Shoulder flexion 3+/5 3+/5  Shoulder abduction 3+/5 3+/5  Shoulder adduction    Elbow flexion 3+/5 3+/5  Elbow extension 3+/5 3+/5  Wrist flexion 4-/5 4-/5  Wrist extension 4-/5 4-/5  Pinch strength    Grip strength 55 lbs 65lbs  (Blank rows = not tested)   Similar to LE report variability in symptoms throughout the day and increased need for assistance with fatigue  Shoulder Posture:  Right Tendency towards Left  [x]   Functional [x]    []   Elevation []    []   Depression []    [x]   Protraction - but functional [x]    []   Retraction []    [x]   Internal rotation - but functional [x]    []   External rotation []    []   Subluxed []     UE Tone: []  Normal []  Flaccid []  Low tone [x]  Spasticity - very mild, more notable in LE with functional weakness  []  Dystonia []  Other:   UE Edema: WFL  Wrist/Hand: Handedness: [x]  Right   []  Left   []  NA: Comments:  Right  Left  [x]   WNL [x]    []   Limitations []     []   Contractures []    []   Fisting []    []   Tremors []    []   Weak grasp []    []   Poor dexterity []    []   Hand movement non functional []    []   Paralysis []         MOBILITY BASE RECOMMENDATIONS and JUSTIFICATION:  MOBILITY BASE  JUSTIFICATION   Manufacturer:  [employer]  Model: Edge 3 Stretto                              Color:  Seat Width:   Seat Depth    []  Manual mobility base (continue below)   []  Scooter/POV  [x]  Power mobility base   Number of hours per day spent in above selected mobility base: 8-12 hours a day  Typical daily mobility base use Schedule: used as primary mode of mobility within and outside of house for all waking hours    [x]  is not a safe, functional ambulator  [x]  limitation prevents from completing a MRADL(s) within a  reasonable time frame    [x]  limitation places at high risk of morbidity or mortality secondary to  the attempts to perform a    MRADL(s)  []  limitation prevents accomplishing a MRADL(s) entirely  [x]  provide independent mobility  [x]  equipment is a lifetime medical need  [x]  walker or cane inadequate  [x]  any type manual wheelchair      inadequate  [x]  scooter/POV inadequate      []  requires dependent mobility          POWER MOBILITY      []  Scooter/POV    []  can safely operate   []  can safely transfer   []  has adequate trunk stability   []  cannot functionally propel  manual wheelchair    [x]  Power mobility base    [x]  non-ambulatory   [x]  cannot functionally propel manual wheelchair with fatigue and progressive nature of symptoms [x]  cannot functionally and safely      operate scooter/POV  [x]  can safely operate power       wheelchair  [x]  home is accessible  [x]  willing to use power wheelchair     Tilt  [x]  Powered tilt on powered chair  []  Powered tilt on manual chair  []  Manual tilt on manual chair Comments:  [x]  change position for pressure      relief/cannot weight shift adequately throughout the day [x]  change  position against      gravitational force on head and      shoulders   [x]  decrease pain  []  blood pressure management   []  control autonomic dysreflexia  [x]  decrease respiratory distress  [x]  management of spasticity  [x]  management of weakness in trunk  [x]  facilitate postural control   [x]  rest periods   [x]  control edema  [x]  increase sitting tolerance  [x]  aid with transfers     Recline   [x]  Power recline on power chair  []  Manual recline on manual chair  Comments:    [x]  intermittent catheterization  [x]  manage spasticity  []  accommodate femur to back angle  [x]  change position for pressure relief/cannot weight shift rhigh risk of pressure sore development  [x]  tilt alone does not accomplish     effective pressure relief [x]  difficult to transfer to and from bed [x]  rest periods and sleeping in chair  [x]  repositioning for transfers  [x]  bring to full recline for ADL care  [x]  clothing changes in chair  []  gravity PEG tube feeding  []  head positioning   [x]  decrease pain  []  blood pressure management   []  control autonomic dysreflexia  [x]  decrease respiratory distress  []  user on ventilator     Elevator on mobility base  [x]  Power wheelchair  []  Scooter  [x]  increase Indep in transfers   [x]  increase Indep in ADLs    [x]  bathroom function and safety  [x]  kitchen/cooking function and safety  []  shopping  [x]  raise height for communication at standing level  []  raise height for eye contact which reduces cervical neck strain and pain  [x]  drive at raised height for safety and navigating crowds  []  Other:   []  Vertical position system  (anterior tilt)     (Drive locks-out)    []  Stand       (Drive enabled)  []  independent weight bearing  []  decrease joint contractures  []  decrease/manage spasticity  []  decrease/manage spasms  []  pressure distribution away from   scapula, sacrum, coccyx, and ischial tuberosity  []  increase digestion  and elimination   []  access  to counters and cabinets  []  increase reach  []  increase interaction with others at eye level, reduces neck strain  []  increase performance of       MRADL(s)      Power elevating legrest    [x]  Center mount (Single) 85-170 degrees       []  Standard (Pair) 100-170 degrees  [x]  position legs at 90 degrees, not available with std power ELR  [x]  center mount tucks into chair to decrease turning radius in home, not available with std power ELR  [x]  provide change in position for LE  [x]  elevate legs during recline    [x]  maintain placement of feet on      footplate  [x]  decrease edema  [x]  improve circulation  [x]  actuator needed to elevate legrest  [x]  actuator needed to articulate legrest preventing knees from flexing  [x]  Increase ground clearance over      curbs  []   STD (pair) independently                     elevate legrest   POWER WHEELCHAIR CONTROLS      Controls/input device  [x]  Expandable  []  Non-expandable  [x]  Proportional  []  Right Hand []  Left Hand  []  Non-proportional/switches/head-array  []  Electrical/proximity         []   Mechanical      Manufacturer:___________________   Type:________________________ [x]  provides access for controlling wheelchair  [x]  programming for accurate control  [x]  progressive disease/changing condition  []  required for alternative drive      controls       []  lacks motor control to operate  proportional drive control  []  unable to understand proportional controls  []  limited movement/strength  []  extraneous movement / tremors / ataxic / spastic       [x]  Upgraded electronics controller/harness    []  Single power (tilt or recline)   []  Expandable    []  Non-expandable plus   [x]  Multi-power (tilt, recline, power legrest, power seat lift, vertical positioning system, stand) - expandable [x]  allows input device to communicate with drive motors  [x]  harness provides necessary connections between the controller, input device, and seat  functions     [x]  needed in order to operate power seat functions through joystick/ input device  [x]  required for alternative drive controls     []  Enhanced display  []  required to connect all alternative drive controls   []  required for upgraded joystick      (lite-throw, heavy duty, micro)  []  Allows user to see in which mode and drive the wheelchair is set; necessary for alternate controls       []  Upgraded tracking electronics  []  correct tracking when on uneven surfaces makes switch driving more efficient and less fatiguing  []  increase safety when driving  []  increase ability to traverse thresholds    []  Safety / reset / mode switches     Type:    []  Used to change modes and stop the wheelchair when driving     [x]  Mount for joystick / input device/switches  [x]  swing away for access or transfers   [x]  attaches joystick / input device / switches to wheelchair   [x]  provides for consistent access  []  midline for optimal placement    []  Attendant controlled joystick plus     mount  []  safety  []  long distance driving  []  operation of seat functions  []   compliance with transportation regulations    [x]  Battery  [x]  required to power (power assist / scooter/ power wc / other):   []  Power inverter (24V to 12V)  []  required for ventilator / respiratory equipment / other:     CHAIR OPTIONS MANUAL & POWER      Armrests   [x]  adjustable height []  removable  []  swing away []  fixed  [x]  flip back  []  reclining  [x]  full length pads []  desk []  tube arms []  gel pads  [x]  provide support with elbow at 90    [x]  remove/flip back/swing away for  transfers  [x]  provide support and positioning of upper body    [x]  allow to come closer to table top  [x]  remove for access to tables  [x]  provide support for w/c tray  [x]  change of height/angles for variable activities   []  Elbow support / Elbow stop  []  keep elbow positioned on arm pad  []  keep arms from falling off arm pad  during tilt and/or  recline   Upper Extremity Support  []  Arm trough  []   R  []   L  Style:  []  swivel mount []  fixed mount   []  posterior hand support  []   tray  []  full tray  []  joystick cut out  []   R  []   L  Style:  []  decrease gravitational pull on      shoulders  []  provide support to increase UE  function  []  provide hand support in natural    position  []  position flaccid UE  []  decrease subluxation    []  decrease edema       []  manage spasticity   []  provide midline positioning  []  provide work surface  []  placement for AAC/ Computer/ EADL       Hangers/ Legrests   []  ______ degree  [x]  Elevating leg rests         [x]  provide LE support  [x]  maintain placement of feet on      footplate   []  accommodate lower leg length  [x]  accommodate to hamstring       tightness  [x]  enable transfers  [x]  provide change in position for LE's  [x]  elevate legs during recline    [x]  decrease edema  []  durability      Foot support   [x]  footplate []  R []  L [x]  flip up           [x]  Depth adjustable   [x]  angle adjustable  []  foot board/one piece    [x]  provide foot support  [x]  accommodate to ankle ROM  [x]  allow foot to go under wheelchair base  [x]  enable transfers     []  Shoe holders  []  position foot    []  decrease / manage spasticity  []  control position of LE  []  stability    []  safety     []  Ankle strap/heel      loops  []  support foot on foot support  []  decrease extraneous movement  []  provide input to heel   []  protect foot     []  Amputee adapter []  R  []  L     Style:                  Size:  []  Provide support for stump/residual extremity    []  Transportation tie-down  []  to provide crash tested tie-down brackets    []  Crutch/cane holder    []  O2  holder    []  IV hanger   []  Ventilator tray/mount    []  stabilize accessory on wheelchair       Component  Justification     [x]  Seat cushion - Tru Comfort Cushion     [x]  accommodate impaired sensation  []  decubitus ulcers present or  history  [x]  unable to shift weight effectively throughout day [x]  increase pressure distribution  []  prevent pelvic extension  []  custom required "off-the-shelf"    seat cushion will not accommodate deformity  [x]  stabilize/promote pelvis alignment  [x]  stabilize/promote femur alignment  []  accommodate obliquity  []  accommodate multiple deformity  []  incontinent/accidents  []  low maintenance     []  seat mounts                 []  fixed []  removable  []  attach seat platform/cushion to wheelchair frame    []  Seat wedge    []  provide increased aggressiveness of seat shape to decrease sliding  down in the seat  []  accommodate ROM        []  Cover replacement   []  protect back or seat cushion  []  incontinent/accidents    []  Solid seat / insert    []  support cushion to prevent      hammocking  []  allows attachment of cushion to mobility base    [x]  Lateral pelvic/thigh/hip     support (Guides)     [x]  decrease abduction  []  accommodate pelvis  [x]  position upper legs  [x]  accommodate spasticity  [x]  removable for transfers     [x]  Lateral pelvic/thigh      supports mounts  []  fixed   []  swing-away   [x]  removable  [x]  mounts lateral pelvic/thigh supports     [x]  mounts lateral pelvic/thigh supports swing-away or removable for transfers    []  Medial thigh support ([place]mel)  [] decrease adduction  [] accommodate ROM  []  remove for transfers   []  alignment      []  Medial thigh   []  fixed      support mounts      []  swing-away   []  removable  []  mounts medial thigh supports   []  Mounts medial supports swing- away or removable for transfers       Component  Justification   [x]  Back - Tru Comfort Back     [x]  provide posterior trunk support []  facilitate tone  [x]  provide lumbar/sacral support []  accommodate deformity  [x]  support trunk in midline   []  custom required "off-the-shelf" back support will not accommodate deformity   [x]  provide lateral trunk support [x]  Accommodate tone  fluctuations throughout day          [x]  Back mounts  []  fixed  [x]  removable  [x]  attach back rest/cushion to wheelchair frame   []  Lateral trunk      supports  []  R []  L  []  decrease lateral trunk leaning  []  accommodate asymmetry    []  contour for increased contact  []  safety    []  control of tone    []  Lateral trunk      supports mounts  []  fixed  []  swing-away   []  removable  []  mounts lateral trunk supports     []  Mounts lateral trunk supports swing-away or removable for transfers   []  Anterior chest      strap, vest     []  decrease forward movement of shoulder  []  decrease forward movement of trunk  []  safety/stability  []  added abdominal  support  []  trunk alignment  []  assistance with shoulder control   []  decrease shoulder elevation    [x]  Headrest      [x]  provide posterior head support  [x]  provide posterior neck support  []  provide lateral head support  []  provide anterior head support  [x]  support during tilt and recline  []  improve feeding     []  improve respiration  []  placement of switches  [x]  safety    []  accommodate ROM   []  accommodate tone  []  improve visual orientation   [x]  Headrest           []  fixed [x]  removable []  flip down      Mounting hardware   [x]  swing-away laterals/switches  [x]  mount headrest   [x]  mounts headrest flip down or  removable for transfers  [x]  mount headrest swing-away laterals   [x]  mount switches     []  Neck Support    []  decrease neck rotation  []  decrease forward neck flexion   Pelvic Positioner    []  std hip belt          []  padded hip belt  []  dual pull hip belt  [x]  Harness  [x]  stabilize tone  [x]  decrease falling out of chair given progressive nature of disease and anticipated changes  [x]  prevent excessive extension/flexion []  special pull angle to control      rotation  []  pad for protection over boney   prominence  []  promote comfort    []  Essential needs        bag/pouch   []  medicines []  special food  rorthotics []  clothing changes  []  diapers  []  catheter/hygiene []  ostomy supplies   The above equipment has a life- long use expectancy.  Growth and changes in medical and/or functional conditions would be the exceptions.   SUMMARY:  Why mobility device was selected; include why a lower level device is not appropriate:   ASSESSMENT:  CLINICAL IMPRESSION: Patient is a 68 y.o. male who was seen today for physical therapy evaluation and treatment for [facility] disease suspected to be an inoperable radiation induced cervical myelopathy vs PLS though at this time per chart review the initial diagnosis is more probable. Given diagnosis and rapid progression of symptoms with patient going from ambulating to wheelchair bound within a few years, patient is in need of different mobility device as symptoms are anticipated to progress further and already do not meet mobility needs. At the time of this eval, patient is using a mix of a basic manual chair and group 2 wheelchair both which were self-purchased second hand; however, given progression of symptoms, these devices are no longer appropriate. Patient has been entirely nonambulatory since 2022 per patient also making cane, walker, and crutches not appropriate. Given fatigue that increases throughout the day, a manual chair is not appropriate as patient even over course of session requires increased assistance from PT and caregiver to propel chair over distances of ~10 feet going from SBA to maxA. Furthermore, both current group 2 power chair and manual chair do not provide adequate pressure relief throughout the day as patient fatigues placing at increased risk for skin breakdown in the future. Given cognition and current state of hand function, a power wheelchair is more appropriate for patient than a dependent mobility device. For these reasons, this physical therapist would recommend the Quantum Stretto power wheelchair. Patient also currently able to transfer with  SBA from level surfaces or downhill surfaces but requires minA  from more elevated surfaces; to maximize independence would recommend elevating seat function for transfers to reduce falls risk and independence. Tilt and recline features will assist with intermittent catherization, pressure relief, and spasticity management. Patient is willing and able to use the recommended device and will meet a lifetime medical need.    OBJECTIVE IMPAIRMENTS decreased strength, increased edema, impaired sensation, impaired tone, pain, and   .   ACTIVITY LIMITATIONS transfers, continence, bathing, toileting, reach over head, hygiene/grooming, and locomotion level  PARTICIPATION LIMITATIONS: meal prep, cleaning, laundry, driving, and community activity  PERSONAL FACTORS Age, Time since onset of injury/illness/exacerbation, and 3+ comorbidities: see above  are also affecting patient's functional outcome.   REHAB POTENTIAL: Good  CLINICAL DECISION MAKING: Evolving/moderate complexity  EVALUATION COMPLEXITY: Moderate                                   GOALS: One time visit. No goals established.    PLAN: PT FREQUENCY: one time visit    Coreen Devoid, PT, DPT 09/20/2023, 1:06 PM    I concur with the above findings and recommendations of the therapist:  Physician name printed:         Physician's signature:      Date:

## 2023-09-25 ENCOUNTER — Ambulatory Visit: Payer: Medicare Other | Admitting: Neurology

## 2023-09-25 ENCOUNTER — Encounter: Payer: Self-pay | Admitting: Neurology

## 2023-09-25 DIAGNOSIS — Z029 Encounter for administrative examinations, unspecified: Secondary | ICD-10-CM
